# Patient Record
Sex: Male | Born: 1955 | Race: White | Hispanic: No | Marital: Married | Smoking: Never smoker
Health system: Southern US, Community
[De-identification: ages and names within clinical notes are randomized; demographics above are authoritative.]

## PROBLEM LIST (undated history)

## (undated) DIAGNOSIS — N529 Male erectile dysfunction, unspecified: Secondary | ICD-10-CM

## (undated) DIAGNOSIS — R3915 Urgency of urination: Secondary | ICD-10-CM

## (undated) DIAGNOSIS — G4730 Sleep apnea, unspecified: Secondary | ICD-10-CM

## (undated) DIAGNOSIS — I10 Essential (primary) hypertension: Secondary | ICD-10-CM

## (undated) DIAGNOSIS — R195 Other fecal abnormalities: Secondary | ICD-10-CM

## (undated) DIAGNOSIS — N189 Chronic kidney disease, unspecified: Secondary | ICD-10-CM

## (undated) DIAGNOSIS — K5909 Other constipation: Secondary | ICD-10-CM

## (undated) DIAGNOSIS — N400 Enlarged prostate without lower urinary tract symptoms: Secondary | ICD-10-CM

## (undated) DIAGNOSIS — E785 Hyperlipidemia, unspecified: Secondary | ICD-10-CM

## (undated) DIAGNOSIS — R350 Frequency of micturition: Secondary | ICD-10-CM

## (undated) DIAGNOSIS — R3129 Other microscopic hematuria: Secondary | ICD-10-CM

## (undated) DIAGNOSIS — Z8669 Personal history of other diseases of the nervous system and sense organs: Secondary | ICD-10-CM

## (undated) DIAGNOSIS — M109 Gout, unspecified: Secondary | ICD-10-CM

## (undated) DIAGNOSIS — K219 Gastro-esophageal reflux disease without esophagitis: Secondary | ICD-10-CM

## (undated) DIAGNOSIS — R351 Nocturia: Secondary | ICD-10-CM

## (undated) DIAGNOSIS — Z960 Presence of urogenital implants: Secondary | ICD-10-CM

## (undated) DIAGNOSIS — J309 Allergic rhinitis, unspecified: Secondary | ICD-10-CM

## (undated) HISTORY — PX: SHOULDER SURGERY: SHX246

## (undated) HISTORY — PX: UPPER GI ENDOSCOPY: SHX6162

## (undated) HISTORY — PX: OTHER SURGICAL HISTORY: SHX169

## (undated) HISTORY — DX: Essential (primary) hypertension: I10

## (undated) HISTORY — DX: Chronic kidney disease, unspecified: N18.9

## (undated) HISTORY — DX: Sleep apnea, unspecified: G47.30

## (undated) HISTORY — DX: Hyperlipidemia, unspecified: E78.5

## (undated) HISTORY — DX: Gastro-esophageal reflux disease without esophagitis: K21.9

## (undated) HISTORY — DX: Gout, unspecified: M10.9

## (undated) HISTORY — DX: Male erectile dysfunction, unspecified: N52.9

---

## 2003-09-03 ENCOUNTER — Ambulatory Visit (HOSPITAL_BASED_OUTPATIENT_CLINIC_OR_DEPARTMENT_OTHER): Admission: RE | Admit: 2003-09-03 | Discharge: 2003-09-03 | Payer: Self-pay | Admitting: Family Medicine

## 2003-10-16 ENCOUNTER — Ambulatory Visit (HOSPITAL_BASED_OUTPATIENT_CLINIC_OR_DEPARTMENT_OTHER): Admission: RE | Admit: 2003-10-16 | Discharge: 2003-10-16 | Payer: Self-pay | Admitting: Otolaryngology

## 2004-04-30 ENCOUNTER — Encounter (INDEPENDENT_AMBULATORY_CARE_PROVIDER_SITE_OTHER): Payer: Self-pay | Admitting: Specialist

## 2004-04-30 ENCOUNTER — Ambulatory Visit (HOSPITAL_COMMUNITY): Admission: RE | Admit: 2004-04-30 | Discharge: 2004-05-01 | Payer: Self-pay | Admitting: Otolaryngology

## 2004-07-08 ENCOUNTER — Ambulatory Visit (HOSPITAL_BASED_OUTPATIENT_CLINIC_OR_DEPARTMENT_OTHER): Admission: RE | Admit: 2004-07-08 | Discharge: 2004-07-08 | Payer: Self-pay | Admitting: Otolaryngology

## 2007-04-12 HISTORY — PX: TONSILLECTOMY AND ADENOIDECTOMY: SUR1326

## 2009-04-24 ENCOUNTER — Encounter (INDEPENDENT_AMBULATORY_CARE_PROVIDER_SITE_OTHER): Payer: Self-pay | Admitting: *Deleted

## 2009-05-11 ENCOUNTER — Encounter (INDEPENDENT_AMBULATORY_CARE_PROVIDER_SITE_OTHER): Payer: Self-pay

## 2009-05-12 ENCOUNTER — Ambulatory Visit: Payer: Self-pay | Admitting: Gastroenterology

## 2009-05-25 ENCOUNTER — Ambulatory Visit: Payer: Self-pay | Admitting: Gastroenterology

## 2009-11-30 ENCOUNTER — Telehealth (INDEPENDENT_AMBULATORY_CARE_PROVIDER_SITE_OTHER): Payer: Self-pay | Admitting: *Deleted

## 2009-12-07 ENCOUNTER — Telehealth: Payer: Self-pay | Admitting: Gastroenterology

## 2009-12-28 ENCOUNTER — Telehealth (INDEPENDENT_AMBULATORY_CARE_PROVIDER_SITE_OTHER): Payer: Self-pay | Admitting: *Deleted

## 2010-05-11 NOTE — Miscellaneous (Signed)
Summary: Lec previsit  Clinical Lists Changes  Medications: Added new medication of MOVIPREP 100 GM  SOLR (PEG-KCL-NACL-NASULF-NA ASC-C) As per prep instructions. - Signed Rx of MOVIPREP 100 GM  SOLR (PEG-KCL-NACL-NASULF-NA ASC-C) As per prep instructions.;  #1 x 0;  Signed;  Entered by: Ulis Rias RN;  Authorized by: Meryl Dare MD [facility];  Method used: Electronically to [id number] [address] [place], [place], [place]  [address], Ph: [phone] or [phone], Fax: [phone] Observations: Added new observation of NKA: T (05/12/2009 16:30)    Prescriptions: MOVIPREP 100 GM  SOLR (PEG-KCL-NACL-NASULF-NA ASC-C) As per prep instructions.  #1 x 0   Entered by:   Ulis Rias RN   Authorized by:   Meryl Dare MD [facility]   Signed by:   Ulis Rias RN on 05/12/2009   Method used:   Electronically to        [phone]* (retail)       [address] [place], [place]  [address]       Ph: [phone] or [phone]       Fax: [phone]   RxID:   [id number]

## 2010-05-11 NOTE — Letter (Signed)
Summary: Previsit letter  [facility]  [address] [place], [place] [address]   Phone: [phone]  Fax: [phone]       04/24/2009 MRN: [id number]  Matthew [name] [address] [place], [place]  [address]  Dear Matthew [name],  Welcome to the Gastroenterology Division at [facility].    You are scheduled to see a nurse for your pre-procedure visit on 05/12/2009 at 4:30PM on the 3rd floor at [facility], [address]  We ask that you try to arrive at our office 15 minutes prior to your appointment time to allow for check-in.  Your nurse visit will consist of discussing your medical and surgical history, your immediate family medical history, and your medications.    Please bring a complete list of all your medications or, if you prefer, bring the medication bottles and we will list them.  We will need to be aware of both prescribed and over the counter drugs.  We will need to know exact dosage information as well.  If you are on blood thinners (Coumadin, Plavix, Aggrenox, Ticlid, etc.) please call our office today/prior to your appointment, as we need to consult with your physician about holding your medication.   Please be prepared to read and sign documents such as consent forms, a financial agreement, and acknowledgement forms.  If necessary, and with your consent, a friend or relative is welcome to sit-in on the nurse visit with you.  Please bring your insurance card so that we may make a copy of it.  If your insurance requires a referral to see a specialist, please bring your referral form from your primary care physician.  No co-pay is required for this nurse visit.     If you cannot keep your appointment, please call [phone] to cancel or reschedule prior to your appointment date.  This allows [place] the opportunity to schedule an appointment for another patient in need of care.    Thank you for choosing [facility] for your medical needs.   We appreciate the opportunity to care for you.  Please visit [place] at our website  to learn more about our practice.                     Sincerely.                                                                                                                   The Gastroenterology Division

## 2010-05-11 NOTE — Procedures (Signed)
Summary: Colonoscopy  Patient: [name] Note: All result statuses are Final unless otherwise noted.  Tests: (1) Colonoscopy (COL)   COL Colonoscopy           DONE     [facility]     [address]     [place], [place]  [address]           COLONOSCOPY PROCEDURE REPORT           PATIENT:  Matthew [name], Matthew [name]  MR#:  [id number]     BIRTHDATE:  1956-01-30, 54 yrs. old  GENDER:  male           ENDOSCOPIST:  Judie Petit T. Russella Dar, MD, [facility]     Referred by:  Rudi Heap, M.D.           PROCEDURE DATE:  05/25/2009     PROCEDURE:  Colonoscopy 21308     ASA CLASS:  Class II     INDICATIONS:  1) Routine Risk Screening           MEDICATIONS:   Fentanyl 50 mcg IV, Versed 6 mg IV           DESCRIPTION OF PROCEDURE:   After the risks benefits and     alternatives of the procedure were thoroughly explained, informed     consent was obtained.  Digital rectal exam was performed and     revealed no abnormalities.   The LB [id number]-[id number] endoscope     was introduced through the anus and advanced to the cecum, which     was identified by both the appendix and ileocecal valve, without     limitations.  The quality of the prep was excellent, using     MoviPrep.  The instrument was then slowly withdrawn as the colon     was fully examined.     <<PROCEDUREIMAGES>>           FINDINGS:  A normal appearing cecum, ileocecal valve, and     appendiceal orifice were identified. The ascending, hepatic     flexure, transverse, splenic flexure, descending, sigmoid colon,     and rectum appeared unremarkable. Retroflexed views in the rectum     revealed no abnormalities. The time to cecum =  2  minutes. The     scope was then withdrawn (time =  9  min) from the patient and the     procedure completed.           COMPLICATIONS:  None           ENDOSCOPIC IMPRESSION:     1) Normal colon           RECOMMENDATIONS:     1) Continue current colorectal screening recommendations for     "routine risk"  patients with a repeat colonoscopy in 10 years.           Venita Lick. Russella Dar, MD, [facility]           n.     eSIGNED:   Venita Lick. Rosmary Dionisio at 05/25/2009 12:07 PM           Johnathan Hausen, [phone]  Note: An exclamation mark (!) indicates a result that was not dispersed into the flowsheet. Document Creation Date: 05/25/2009 12:07 PM _______________________________________________________________________  (1) Order result status: Final Collection or observation date-time: 05/25/2009 12:03 Requested date-time:  Receipt date-time:  Reported date-time:  Referring Physician:   Ordering Physician: Claudette Head [phone]) Specimen Source:  Source: Launa Grill Order  Number: [id number] Lab site:   Appended Document: Colonoscopy    Clinical Lists Changes  Observations: Added new observation of COLONNXTDUE: 05/2019 (05/25/2009 13:07)

## 2010-05-11 NOTE — Letter (Signed)
Summary: [facility] Instructions  [facility]  [address] [place], [place] [address]   Phone: [phone]  Fax: [phone]       CRESPIN FORSTROM    [birth year]    MRN: [id number]        Procedure Day /Date:  Monday 05/25/2009     Arrival Time: 10:30 am      Procedure Time: 11:30 am     Location of Procedure:                    _ x_   Endoscopy Center (4th Floor)   PREPARATION FOR COLONOSCOPY WITH MOVIPREP   Starting 5 days prior to your procedure Wednesday 2/9 do not eat nuts, seeds, popcorn, corn, beans, peas,  salads, or any raw vegetables.  Do not take any fiber supplements (e.g. Metamucil, Citrucel, and Benefiber).  THE DAY BEFORE YOUR PROCEDURE         DATE: Sunday 2/13  1.  Drink clear liquids the entire day-NO SOLID FOOD  2.  Do not drink anything colored red or purple.  Avoid juices with pulp.  No orange juice.  3.  Drink at least 64 oz. (8 glasses) of fluid/clear liquids during the day to prevent dehydration and help the prep work efficiently.  CLEAR LIQUIDS INCLUDE: Water Jello Ice Popsicles Tea (sugar ok, no milk/cream) Powdered fruit flavored drinks Coffee (sugar ok, no milk/cream) Gatorade Juice: apple, white grape, white cranberry  Lemonade Clear bullion, consomm, broth Carbonated beverages (any kind) Strained chicken noodle soup Hard Candy                             4.  In the morning, mix first dose of MoviPrep solution:    Empty 1 Pouch A and 1 Pouch B into the disposable container    Add lukewarm drinking water to the top line of the container. Mix to dissolve    Refrigerate (mixed solution should be used within 24 hrs)  5.  Begin drinking the prep at 5:00 p.m. The MoviPrep container is divided by 4 marks.   Every 15 minutes drink the solution down to the next mark (approximately 8 oz) until the full liter is complete.   6.  Follow completed prep with 16 oz of clear liquid of your choice (Nothing red or purple).   Continue to drink clear liquids until bedtime.  7.  Before going to bed, mix second dose of MoviPrep solution:    Empty 1 Pouch A and 1 Pouch B into the disposable container    Add lukewarm drinking water to the top line of the container. Mix to dissolve    Refrigerate  THE DAY OF YOUR PROCEDURE      DATE: Monday 2/14  Beginning at 6:30 a.m. (5 hours before procedure):         1. Every 15 minutes, drink the solution down to the next mark (approx 8 oz) until the full liter is complete.  2. Follow completed prep with 16 oz. of clear liquid of your choice.    3. You may drink clear liquids until 9:30 am (2 HOURS BEFORE PROCEDURE).   MEDICATION INSTRUCTIONS  Unless otherwise instructed, you should take regular prescription medications with a small sip of water   as early as possible the morning of your procedure.         OTHER INSTRUCTIONS  You will need a responsible adult at least  53 years of age to accompany you and drive you home.   This person must remain in the waiting room during your procedure.  Wear loose fitting clothing that is easily removed.  Leave jewelry and other valuables at home.  However, you may wish to bring a book to read or  an iPod/MP3 player to listen to music as you wait for your procedure to start.  Remove all body piercing jewelry and leave at home.  Total time from sign-in until discharge is approximately 2-3 hours.  You should go home directly after your procedure and rest.  You can resume normal activities the  day after your procedure.  The day of your procedure you should not:   Drive   Make legal decisions   Operate machinery   Drink alcohol   Return to work  You will receive specific instructions about eating, activities and medications before you leave.    The above instructions have been reviewed and explained to me by   Ulis Rias RN  May 12, 2009 4:58 PM     I fully understand and can verbalize these  instructions _____________________________ Date _________

## 2010-05-11 NOTE — Progress Notes (Signed)
  Phone Note Other Incoming   Request: Send information Summary of Call: Request for records received from [employer]. Request forwarded to [employer].

## 2010-05-11 NOTE — Progress Notes (Signed)
Summary: paperwork  Phone Note Call from Patient Call back at Home Phone [phone]   Caller: Patient Call For: Dr. Russella Dar Reason for Call: Talk to Nurse Summary of Call: following up on insurance claim paperwork that was dropped off 17th or 18th of this month Initial call taken by: Vallarie Mare,  December 07, 2009 4:59 PM  Follow-up for Phone Call        spoke with Renae in Medical Records... she confirmed that papers have been received and she will be calling the pt today Follow-up by: Vallarie Mare,  December 08, 2009 8:41 AM

## 2010-05-11 NOTE — Progress Notes (Signed)
  Phone Note Other Incoming   Request: Send information Summary of Call: Request for [employer] to complete documentation. Request forwarded to [employer].

## 2010-08-27 NOTE — Procedures (Signed)
NAME:  Matthew [name], Matthew [name]             ACCOUNT NO.:  [id number]   MEDICAL RECORD NO.:  [id number]          PATIENT TYPE:  OUT   LOCATION:  SLEEP CENTER                 FACILITY:  [facility]   PHYSICIAN:  Clinton D. Maple Hudson, M.D. DATE OF BIRTH:  02-Feb-1956   DATE OF ADMISSION:  10/16/2003  DATE OF DISCHARGE:  10/16/2003                              NOCTURNAL POLYSOMNOGRAM   REFERRING PHYSICIAN:  Dr. Osborn Coho   INDICATION FOR STUDY/HISTORY:  CPAP titration requested for problem of  hypersomnia with obstructive sleep apnea.  Baseline diagnostic NPSG on Sep 09, 2003 recorded a RDI of 27.3 obstructive events per hour associated with  mild to moderate snoring, oxygen desaturation at 85% and normal cardiac  rhythm.  Home medications include Diovan, Indocin and Flonase with no  bedtime medications taken for this study.  Neck size is 16.5 inches, BMI  35.7, weight 250 pounds.  His Epworth score, on Sep 09, 2003, was 14/24.   SLEEP ARCHITECTURE:  A total of 333 minutes of sleep were recorded with a  sleep efficiency of 73%.  Latency to sleep onset was 53 minutes, latency to  REM was 100 minutes.   RESPIRATORY DATA:  CPAP was titrated to 12 CWP, RDI of 0 per hour.  He was  fitted with a large Respironics comfort full face mask and a heated  humidifier.  CPAP was well-tolerated and effective.   OXYGEN DATA:  No supplemental oxygen was required.  Mean oxygen saturation  during sleep was 95% with a nadir of 90% recorded during stage 1 before full  CPAP control.   CARDIAC DATA:  Normal sinus rhythm without significant ectopics.   MOVEMENTS/PARASOMNIAS:  Nineteen limb jerks were recorded of which seven  were associated with arousals for a periodic limb movement with arousal  index of 1.3 per hour, which is not likely to be clinically significant.   IMPRESSION:  Moderately severe obstructive sleep apnea/hypopnea based on  diagnostic study Sep 09, 2003.  Good CPAP control on 12 CWP, RDI of 0 per  hour.                                   ______________________________                                Rennis Chris. Maple Hudson, M.D.                                Diplomate, Biomedical engineer of Sleep Medicine    CDY/MEDQ  D:  10/26/2003 13:28:34  T:  10/26/2003 21:52:47  Job:  [id number]

## 2010-08-27 NOTE — Procedures (Signed)
NAME:  Matthew [name], Matthew [name]               ACCOUNT NO.:  [id number]   MEDICAL RECORD NO.:  [id number]          PATIENT TYPE:  OUT   LOCATION:  SLEEP CENTER                 FACILITY:  [facility]   PHYSICIAN:  Clinton D. Maple Hudson, M.D. DATE OF BIRTH:  [birth year]   DATE OF STUDY:  07/08/2004                              NOCTURNAL POLYSOMNOGRAM   REFERRING PHYSICIAN:  Dr. Onalee Hua L. Shoemaker.   INDICATION FOR SURGERY:  Hypersomnia with sleep apnea.   EPWORTH SLEEPINESS SCORE:  2/24.   BODY MASS INDEX:  33.   WEIGHT:  235 pounds.   INDICATION:  The patient had an initial diagnostic study on Sep 09, 2003  reporting an RDI of 27.3 and a subsequent CPAP titration on October 16, 2003  with a CPAP of 12.  At that time, he weighed 254 pounds.  He has  subsequently lost weight had surgery.  This is a followup study.   SLEEP ARCHITECTURE:  Total sleep time 386 minutes with sleep efficiency 84%.  Stage I was 5%, stage II 51%, stages III and IV 19%, and REM was 26% of  total sleep time.  Sleep latency 25 minutes, REM latency 97 minutes, awake  after sleep onset 48 minutes, arousal index 10.  No sleep medication was  reported.   RESPIRATORY DATA:  Respiratory disturbance index (RDI) 13.2 obstructive  events per hour, indicating mild obstructive sleep apnea/hypopnea syndrome.  Events were not positional.  REM RDI 15.  He did not qualify for split-  protocol titration because of insufficient early events.   OXYGEN DATA:  Very light snoring heard only occasionally by the technician.  Brief oxygen desaturation to a  nadir of 86%.  Mean oxygen saturation  through the study was 93% on room air.   CARDIAC DATA:  Normal sinus rhythm.   MOVEMENT/PARASOMNIA:  Insignificant.   IMPRESSION/RECOMMENDATION:  1.  Mild obstructive sleep apnea/hypopnea syndrome, respiratory disturbance      index 13.2 per hour (18 central apneas, 17 obstructive apneas, 1 mixed      apnea, 49 hypopneas).  Insignificant snoring and minimal  oxygen      desaturation.  2.  Weight loss and surgery have occurred since previous diagnostic study of      Sep 09, 2003 reported respiratory disturbance index of 27.3.      CDY/MEDQ  D:  07/11/2004 15:14:22  T:  07/12/2004 07:02:02  Job:  [id number]

## 2010-08-27 NOTE — Op Note (Signed)
NAMERESEAN, BRANDER               ACCOUNT NO.:  [id number]   MEDICAL RECORD NO.:  [id number]          PATIENT TYPE:  OIB   LOCATION:  NA                           FACILITY:  [facility]   PHYSICIAN:  Kinnie Scales. Annalee Genta, M.D.DATE OF BIRTH:  [birth year]   DATE OF PROCEDURE:  04/30/2004  DATE OF DISCHARGE:                                 OPERATIVE REPORT   PREOPERATIVE DIAGNOSES:  1.  Moderately severe obstructive sleep apnea.  2.  Nasal airway obstruction.  3.  Nasal septal deviation.  4.  Uvulopalatal and tonsillar hypertrophy.   POSTOPERATIVE DIAGNOSES:  1.  Moderately severe obstructive sleep apnea.  2.  Nasal airway obstruction.  3.  Nasal septal deviation.  4.  Uvulopalatal and tonsillar hypertrophy.   INDICATIONS FOR SURGERY:  1.  Moderately severe obstructive sleep apnea.  2.  Nasal airway obstruction.  3.  Nasal septal deviation.  4.  Uvulopalatal and tonsillar hypertrophy.   SURGICAL PROCEDURES:  1.  Uvulopalatopharyngoplasty.  2.  Nasal septoplasty.  3.  Tonsillectomy.  4.  Bilateral inferior turbinate reduction.   ANESTHESIA:  General endotracheal.   SURGEON:  Kinnie Scales. Annalee Genta, M.D.   COMPLICATIONS:  None.   ESTIMATED BLOOD LOSS:  Less than 50 cc.   Patient transferred from the operating room to the recovery room in stable  condition.   BRIEF HISTORY:  Mr. Gieselman is a 55 year old white male who was referred for  evaluation of obstructive sleep apnea.  The patient had undergone prior  sleep study which showed moderately severe obstruction.  He was titrated on  CPAP which he attempted to wear at home for over a year.  Unfortunately, the  patient was unable to wear CPAP on a consistent basis because of complaints  of nasal airway pressure, obstruction, and headache, and based on his  failure to use CPAP I recommended surgical intervention.  The patient's  anatomy showed a deviated septum, turbinate hypertrophy, uvulopalatal and  tonsillar hypertrophy, and  given this history and examination I recommended  nasal septoplasty, turbinate reduction, uvulopalatopharyngoplasty, and  tonsillectomy.  The risks, benefits, and possible complications of each of  these surgical procedures were discussed in detail with Mr. Hoel and his  wife, and they understood and concurred with our plan for surgery, which was  scheduled as above.   PROCEDURE:  The patient was brought to the operating room on April 30, 2004 and placed in a supine position on the operating table.  General  endotracheal anesthesia was established without difficulty, and the patient  was adequately anesthetized.  His nasal cavity was injected with 12 cc of 1%  lidocaine 1:100,000 solution of epinephrine injected in a submucosal fashion  along the nasal septum and inferior turbinates bilaterally.  The patient's  nose was then packed with Afrin-soaked cottonoid pledgets which were left in  place for approximately 10 minutes to allow for vasoconstriction and  hemostasis.  The surgical procedure was begun with the nasoseptoplasty.  The  patient's cottonoid pledgets were removed, and he was prepped and draped in  a sterile fashion.  A right anterior hemitransfixion incision  was created,  and a mucoperichondrial flap was elevated from anterior to posterior along  the patient's right hand side.  Deviated bone and cartilage were resected,  and midseptal cartilage was morselized and returned to the mucoperichondrial  pocket at the conclusion of the surgical procedure.  A mucoperiosteal flap  was elevated along the patient's right hand side, and bilateral mucosal  flaps were well elevated.  Deviated bone and cartilage, particularly in the  posterior aspect of the septum, and a large inferior septal spur along the  right hand side were resected with a 4 mm osteotome.  The mucoperichondrial  flaps were reapproximated with a 4-0 gut suture on a Keith needle in a  horizontal mattressing fashion,  and the anterior hemitransfixion incision  was closed with the same stitch.  At the conclusion of the surgical  procedure, bilateral Doyle nasal septal splints were placed after the  application of Bactroban ointment, and these were sutured in position with a  3-0 Ethilon suture.   Inferior turbinate cautery/reduction was then performed.  Bipolar intramural  cautery set at 12 watts was used, with several passes in a submucosal  fashion to each inferior turbinate.  The turbinates were then outfractured,  and the inferior component of the turbinate was resected and cauterized.  The patient's nasal cavity was then irrigated and suctioned.   A Crowe-Davis mouth gag was inserted without difficulty.  There were no  loose or broken teeth, and the hard and soft palate were intact.  The  patient was found to have tonsillar and uvulopalatal hypertrophy.  Beginning  on the patient's left hand side, tonsillectomy was performed using Harmonic  scalpel and dissecting in a subcapsular fashion.  The entire left tonsil was  resected from superior pole to tongue base.  Right tonsil was removed in a  similar fashion.  Tonsillar fossa were gently abraded with a dry tonsil  sponge.  Several areas of point hemorrhage were cauterized with suction  cautery.   With completion of the tonsillectomy, a uvulopalatopharyngoplasty was  performed.  The anterior to posterior dimension of the palate was estimated.  An approximately 1.5 cm strip of mucosa and muscle was resected beginning  along the anterior aspect of the tonsillar pillars, dissecting in a superior  to inferior direction and preserving posterior mucosa for closure.  A  uvulopalatopharyngoplasty was performed.  Reconstruction was then undertaken  using a 4-0 Vicryl suture on a tapered needle to advance the posterior  tonsillar pillars and reapproximate tonsillar fossa.  Posterior mucosa was then advanced and closed along the margin of the palate.  The  patient's  nasal cavity and nasopharynx were then irrigated and suctioned.  An  orogastric tube was passed, and the stomach contents were aspirated.  There  was no active bleeding.  Crowe-Davis mouth gag was released and reapplied.  Again, no  bleeding was noted.  The Crowe-Davis mouth gag was removed.  There were no  loose or broken teeth.  The patient was then awakened from his anesthetic.  He was extubated and was transferred from the operating room to the recovery  room in stable condition.  There were no complications.  Blood loss was less  than 50 cc.       DLS/MEDQ  D:  34/74/2595  T:  04/30/2004  Job:  [id number]

## 2012-06-26 ENCOUNTER — Other Ambulatory Visit: Payer: Self-pay | Admitting: Nurse Practitioner

## 2012-06-26 DIAGNOSIS — E039 Hypothyroidism, unspecified: Secondary | ICD-10-CM

## 2012-06-26 DIAGNOSIS — E785 Hyperlipidemia, unspecified: Secondary | ICD-10-CM

## 2012-06-26 DIAGNOSIS — E559 Vitamin D deficiency, unspecified: Secondary | ICD-10-CM

## 2012-06-26 DIAGNOSIS — I10 Essential (primary) hypertension: Secondary | ICD-10-CM

## 2012-06-26 DIAGNOSIS — E291 Testicular hypofunction: Secondary | ICD-10-CM

## 2012-06-29 ENCOUNTER — Other Ambulatory Visit: Payer: Self-pay

## 2012-06-29 NOTE — Progress Notes (Signed)
Patient came in for labs only.

## 2012-07-02 NOTE — Progress Notes (Signed)
Patient coming back for labs

## 2012-07-07 ENCOUNTER — Other Ambulatory Visit: Payer: Self-pay | Admitting: Nurse Practitioner

## 2012-08-17 ENCOUNTER — Other Ambulatory Visit: Payer: Self-pay

## 2012-08-17 MED ORDER — TESTOSTERONE 20.25 MG/1.25GM (1.62%) TD GEL: 2.0000 | Freq: Every day | TRANSDERMAL | Status: DC

## 2012-08-17 NOTE — Telephone Encounter (Signed)
Last seen 09/21/11   If approved print RX and have nurse call patient to pick up

## 2012-10-08 ENCOUNTER — Other Ambulatory Visit: Payer: Self-pay | Admitting: Nurse Practitioner

## 2012-10-08 MED ORDER — TESTOSTERONE 20.25 MG/1.25GM (1.62%) TD GEL: 2.0000 | Freq: Every day | TRANSDERMAL | Status: DC

## 2012-10-08 MED ORDER — OMEPRAZOLE 40 MG PO CPDR: 40.0000 mg | DELAYED_RELEASE_CAPSULE | Freq: Every day | ORAL | Status: DC

## 2012-10-08 NOTE — Telephone Encounter (Signed)
Last seen 09/21/11   MMM

## 2012-11-05 ENCOUNTER — Other Ambulatory Visit: Payer: Self-pay | Admitting: Nurse Practitioner

## 2012-11-06 ENCOUNTER — Other Ambulatory Visit: Payer: Self-pay | Admitting: *Deleted

## 2012-11-06 MED ORDER — TADALAFIL 20 MG PO TABS: ORAL_TABLET | ORAL | Status: DC

## 2012-11-06 NOTE — Telephone Encounter (Signed)
LAST OV 6/13

## 2012-11-07 NOTE — Telephone Encounter (Signed)
Not seen since 06/13

## 2012-11-09 ENCOUNTER — Telehealth: Payer: Self-pay | Admitting: Nurse Practitioner

## 2012-11-10 ENCOUNTER — Other Ambulatory Visit: Payer: Self-pay | Admitting: Nurse Practitioner

## 2012-11-12 NOTE — Telephone Encounter (Signed)
Patients last office visit 09-21-11. Please advise. Was to follow up in 3 months.

## 2012-11-13 ENCOUNTER — Other Ambulatory Visit: Payer: Self-pay | Admitting: Nurse Practitioner

## 2012-11-13 DIAGNOSIS — Z125 Encounter for screening for malignant neoplasm of prostate: Secondary | ICD-10-CM

## 2012-11-13 DIAGNOSIS — I10 Essential (primary) hypertension: Secondary | ICD-10-CM

## 2012-11-13 DIAGNOSIS — E291 Testicular hypofunction: Secondary | ICD-10-CM

## 2012-11-13 DIAGNOSIS — E785 Hyperlipidemia, unspecified: Secondary | ICD-10-CM

## 2012-11-14 ENCOUNTER — Other Ambulatory Visit (INDEPENDENT_AMBULATORY_CARE_PROVIDER_SITE_OTHER): Payer: BC Managed Care – PPO

## 2012-11-14 DIAGNOSIS — E291 Testicular hypofunction: Secondary | ICD-10-CM

## 2012-11-14 DIAGNOSIS — E785 Hyperlipidemia, unspecified: Secondary | ICD-10-CM

## 2012-11-14 DIAGNOSIS — I10 Essential (primary) hypertension: Secondary | ICD-10-CM

## 2012-11-14 DIAGNOSIS — Z125 Encounter for screening for malignant neoplasm of prostate: Secondary | ICD-10-CM

## 2012-11-14 NOTE — Telephone Encounter (Signed)
Rx done on 11/13/2012.

## 2012-11-14 NOTE — Progress Notes (Signed)
Patient came in for labs only.

## 2012-11-19 LAB — CMP14+EGFR
ALT: 15 IU/L (ref 0–44)
AST: 19 IU/L (ref 0–40)
Albumin/Globulin Ratio: 2.4 (ref 1.1–2.5)
Albumin: 4.8 g/dL (ref 3.5–5.5)
Alkaline Phosphatase: 56 IU/L (ref 39–117)
BUN/Creatinine Ratio: 15 (ref 9–20)
BUN: 22 mg/dL (ref 6–24)
CO2: 26 mmol/L (ref 18–29)
Calcium: 9.6 mg/dL (ref 8.7–10.2)
Chloride: 101 mmol/L (ref 97–108)
Creatinine, Ser: 1.43 mg/dL — ABNORMAL HIGH (ref 0.76–1.27)
GFR calc Af Amer: 63 mL/min/{1.73_m2} (ref >59)
GFR calc non Af Amer: 55 mL/min/{1.73_m2} — ABNORMAL LOW (ref >59)
Globulin, Total: 2 g/dL (ref 1.5–4.5)
Glucose: 95 mg/dL (ref 65–99)
Potassium: 4.9 mmol/L (ref 3.5–5.2)
Sodium: 139 mmol/L (ref 134–144)
Total Bilirubin: 0.5 mg/dL (ref 0.0–1.2)
Total Protein: 6.8 g/dL (ref 6.0–8.5)

## 2012-11-19 LAB — NMR, LIPOPROFILE
Cholesterol: 135 mg/dL (ref <200)
HDL Cholesterol by NMR: 39 mg/dL — ABNORMAL LOW (ref >=40)
HDL Particle Number: 26.9 umol/L — ABNORMAL LOW (ref >=30.5)
LDL Particle Number: 1144 nmol/L — ABNORMAL HIGH (ref <1000)
LDL Size: 20.7 nm (ref >20.5)
LDLC SERPL CALC-MCNC: 71 mg/dL (ref <100)
LP-IR Score: 58 — ABNORMAL HIGH (ref <=45)
Small LDL Particle Number: 558 nmol/L — ABNORMAL HIGH (ref <=527)
Triglycerides by NMR: 125 mg/dL (ref <150)

## 2012-11-19 LAB — PSA, TOTAL AND FREE
PSA, Free Pct: 24.3 %
PSA, Free: 0.17 ng/mL
PSA: 0.7 ng/mL (ref 0.0–4.0)

## 2012-11-19 LAB — TESTOSTERONE,FREE AND TOTAL
Testosterone, Free: 9.1 pg/mL (ref 7.2–24.0)
Testosterone: 524 ng/dL (ref 348–1197)

## 2012-11-30 ENCOUNTER — Other Ambulatory Visit: Payer: Self-pay | Admitting: Nurse Practitioner

## 2012-11-30 MED ORDER — INDOMETHACIN 50 MG PO CAPS: 50.0000 mg | ORAL_CAPSULE | Freq: Two times a day (BID) | ORAL | Status: DC

## 2012-12-30 ENCOUNTER — Other Ambulatory Visit: Payer: Self-pay | Admitting: Nurse Practitioner

## 2013-01-02 ENCOUNTER — Other Ambulatory Visit: Payer: Self-pay | Admitting: *Deleted

## 2013-01-02 MED ORDER — TESTOSTERONE 20.25 MG/1.25GM (1.62%) TD GEL: 2.0000 | Freq: Every day | TRANSDERMAL | Status: DC

## 2013-01-02 NOTE — Telephone Encounter (Signed)
LAST RF 10/10/12. LAST TEST LEVEL 11/14/12. LAST OV 09/21/11.

## 2013-01-02 NOTE — Telephone Encounter (Signed)
rx ready for pickup 

## 2013-01-03 NOTE — Telephone Encounter (Signed)
Number not working up front to be picked up

## 2013-02-12 ENCOUNTER — Other Ambulatory Visit: Payer: Self-pay

## 2013-02-12 NOTE — Telephone Encounter (Signed)
Last seen 09/21/11

## 2013-02-14 ENCOUNTER — Other Ambulatory Visit: Payer: Self-pay

## 2013-02-18 ENCOUNTER — Other Ambulatory Visit: Payer: BC Managed Care – PPO

## 2013-02-19 ENCOUNTER — Other Ambulatory Visit: Payer: Self-pay | Admitting: Nurse Practitioner

## 2013-02-19 ENCOUNTER — Other Ambulatory Visit (INDEPENDENT_AMBULATORY_CARE_PROVIDER_SITE_OTHER): Payer: BC Managed Care – PPO

## 2013-02-19 DIAGNOSIS — E785 Hyperlipidemia, unspecified: Secondary | ICD-10-CM

## 2013-02-19 DIAGNOSIS — E291 Testicular hypofunction: Secondary | ICD-10-CM

## 2013-02-19 DIAGNOSIS — E039 Hypothyroidism, unspecified: Secondary | ICD-10-CM

## 2013-02-19 DIAGNOSIS — I10 Essential (primary) hypertension: Secondary | ICD-10-CM

## 2013-02-19 DIAGNOSIS — E559 Vitamin D deficiency, unspecified: Secondary | ICD-10-CM

## 2013-02-19 LAB — COMPREHENSIVE METABOLIC PANEL
ALT: 16 U/L (ref 0–53)
AST: 16 U/L (ref 0–37)
Albumin: 4.1 g/dL (ref 3.5–5.2)
Alkaline Phosphatase: 55 U/L (ref 39–117)
BUN: 14 mg/dL (ref 6–23)
CO2: 29 mEq/L (ref 19–32)
Calcium: 9.5 mg/dL (ref 8.4–10.5)
Chloride: 103 mEq/L (ref 96–112)
Creat: 1.08 mg/dL (ref 0.50–1.35)
Glucose, Bld: 95 mg/dL (ref 70–99)
Potassium: 5.3 mEq/L (ref 3.5–5.3)
Sodium: 139 mEq/L (ref 135–145)
Total Bilirubin: 0.5 mg/dL (ref 0.3–1.2)
Total Protein: 6.5 g/dL (ref 6.0–8.3)

## 2013-02-19 LAB — THYROID PANEL WITH TSH
Free Thyroxine Index: 3.3 (ref 1.0–3.9)
T3 Uptake: 35.1 % (ref 22.5–37.0)
T4, Total: 9.5 ug/dL (ref 5.0–12.5)
TSH: 1.189 u[IU]/mL (ref 0.350–4.500)

## 2013-02-19 MED ORDER — TESTOSTERONE 20.25 MG/1.25GM (1.62%) TD GEL: 2.0000 | Freq: Every day | TRANSDERMAL | Status: DC

## 2013-02-19 NOTE — Progress Notes (Signed)
Patient came in for labs only.

## 2013-02-20 LAB — NMR LIPOPROFILE WITH LIPIDS
Cholesterol, Total: 151 mg/dL (ref <200)
HDL Particle Number: 33.8 umol/L (ref >=30.5)
HDL Size: 8.3 nm — ABNORMAL LOW (ref >=9.2)
HDL-C: 43 mg/dL (ref >=40)
LDL (calc): 69 mg/dL (ref <100)
LDL Particle Number: 1372 nmol/L — ABNORMAL HIGH (ref <1000)
LDL Size: 20.5 nm — ABNORMAL LOW (ref >20.5)
LP-IR Score: 93 — ABNORMAL HIGH (ref <=45)
Large HDL-P: <1.3 umol/L — ABNORMAL LOW (ref >=4.8)
Large VLDL-P: 15.4 nmol/L — ABNORMAL HIGH (ref <=2.7)
Small LDL Particle Number: 718 nmol/L — ABNORMAL HIGH (ref <=527)
Triglycerides: 195 mg/dL — ABNORMAL HIGH (ref <150)
VLDL Size: 61.1 nm — ABNORMAL HIGH (ref <=46.6)

## 2013-02-20 LAB — VITAMIN D 25 HYDROXY (VIT D DEFICIENCY, FRACTURES): Vit D, 25-Hydroxy: 50 ng/mL (ref 30–89)

## 2013-02-20 LAB — PSA: PSA: 0.51 ng/mL (ref <=4.00)

## 2013-02-21 LAB — TESTOSTERONE, TOTAL AND FREE DIRECT MEASURE
Free Testosterone, Direct: 2.4 pg/mL — ABNORMAL LOW (ref 3.8–34.2)
Testosterone: 193 ng/dL — ABNORMAL LOW (ref 300–890)

## 2013-03-12 ENCOUNTER — Other Ambulatory Visit: Payer: Self-pay | Admitting: Nurse Practitioner

## 2013-03-12 MED ORDER — TADALAFIL 5 MG PO TABS: 5.0000 mg | ORAL_TABLET | Freq: Every day | ORAL | Status: DC | PRN

## 2013-03-15 ENCOUNTER — Encounter: Payer: Self-pay | Admitting: Nurse Practitioner

## 2013-03-15 ENCOUNTER — Ambulatory Visit (INDEPENDENT_AMBULATORY_CARE_PROVIDER_SITE_OTHER): Payer: BC Managed Care – PPO | Admitting: Nurse Practitioner

## 2013-03-15 VITALS — BP 140/97 | HR 98 | Temp 98.0°F | Ht 70.0 in | Wt 255.0 lb

## 2013-03-15 DIAGNOSIS — E785 Hyperlipidemia, unspecified: Secondary | ICD-10-CM | POA: Insufficient documentation

## 2013-03-15 DIAGNOSIS — Z1212 Encounter for screening for malignant neoplasm of rectum: Secondary | ICD-10-CM

## 2013-03-15 DIAGNOSIS — I10 Essential (primary) hypertension: Secondary | ICD-10-CM

## 2013-03-15 DIAGNOSIS — M109 Gout, unspecified: Secondary | ICD-10-CM

## 2013-03-15 DIAGNOSIS — N529 Male erectile dysfunction, unspecified: Secondary | ICD-10-CM

## 2013-03-15 MED ORDER — TESTOSTERONE 20.25 MG/1.25GM (1.62%) TD GEL: 2.0000 | Freq: Every day | TRANSDERMAL | Status: DC

## 2013-03-15 MED ORDER — BENAZEPRIL HCL 40 MG PO TABS: 40.0000 mg | ORAL_TABLET | Freq: Every day | ORAL | Status: DC

## 2013-03-15 MED ORDER — OMEPRAZOLE 40 MG PO CPDR: 40.0000 mg | DELAYED_RELEASE_CAPSULE | Freq: Every day | ORAL | Status: DC

## 2013-03-15 MED ORDER — ROSUVASTATIN CALCIUM 10 MG PO TABS: 10.0000 mg | ORAL_TABLET | Freq: Every day | ORAL | Status: DC

## 2013-03-15 MED ORDER — INDOMETHACIN 50 MG PO CAPS: 50.0000 mg | ORAL_CAPSULE | Freq: Two times a day (BID) | ORAL | Status: DC

## 2013-03-15 NOTE — Progress Notes (Signed)
Subjective:    Patient ID: Matthew [name], male    DOB: 10/19/1955, 57 y.o.   MRN: [id number]  HPI  Patient here today for follow up of chronic medical problems- He is doing well without complaints Patient Active Problem List   Diagnosis Date Noted  . Gout 03/15/2013  . Hypertension 03/15/2013  . Hyperlipidemia LDL goal < 100 03/15/2013  . Erectile dysfunction 03/15/2013   Outpatient Encounter Prescriptions as of 03/15/2013  Medication Sig  . benazepril (LOTENSIN) 40 MG tablet TAKE 1/2 TO 1 TABLET BY MOUTH EVERY DAY  . CRESTOR 5 MG tablet TAKE ONE TABLET BY MOUTH ONE TIME DAILY  . indomethacin (INDOCIN) 50 MG capsule Take 1 capsule (50 mg total) by mouth 2 (two) times daily with a meal.  . omeprazole (PRILOSEC) 40 MG capsule TAKE 1 CAPSULE (40 MG TOTAL)  BY MOUTH DAILY.  . tadalafil (CIALIS) 20 MG tablet TAKE ONE TABLET BY MOUTH AS NEEDED  . tadalafil (CIALIS) 5 MG tablet Take 1 tablet (5 mg total) by mouth daily as needed for erectile dysfunction.  . Testosterone (ANDROGEL) 20.25 MG/1.25GM (1.62%) GEL Place 2 Squirts onto the skin daily.       Review of Systems  Constitutional: Negative.   HENT: Negative.   Eyes: Negative.   Respiratory: Negative.   Cardiovascular: Negative.   Gastrointestinal: Negative.   Musculoskeletal: Negative.   Psychiatric/Behavioral: Negative.   All other systems reviewed and are negative.       Objective:   Physical Exam  Constitutional: He is oriented to person, place, and time. He appears well-developed and well-nourished.  HENT:  Head: Normocephalic.  Right Ear: External ear normal.  Left Ear: External ear normal.  Nose: Nose normal.  Mouth/Throat: Oropharynx is clear and moist.  Eyes: EOM are normal. Pupils are equal, round, and reactive to light.  Neck: Normal range of motion. Neck supple. No JVD present. No thyromegaly present.  Cardiovascular: Normal rate, regular rhythm, normal heart sounds and intact distal pulses.  Exam reveals  no gallop and no friction rub.   No murmur heard. Pulmonary/Chest: Effort normal and breath sounds normal. No respiratory distress. He has no wheezes. He has no rales. He exhibits no tenderness.  Abdominal: Soft. Bowel sounds are normal. He exhibits no mass. There is no tenderness.  Genitourinary: Prostate normal and penis normal.  Musculoskeletal: Normal range of motion. He exhibits no edema.  Lymphadenopathy:    He has no cervical adenopathy.  Neurological: He is alert and oriented to person, place, and time. No cranial nerve deficit.  Skin: Skin is warm and dry.  2cm nontender no mobile lesion left upper thigh  Psychiatric: He has a normal mood and affect. His behavior is normal. Judgment and thought content normal.      BP 140/97  Pulse 98  Temp(Src) 98 F (36.7 C) (Oral)  Ht 5\' 10"  (1.778 m)  Wt 255 lb (115.667 kg)  BMI 36.59 kg/m2     Assessment & Plan:   1. Screening for malignant neoplasm of the rectum   2. Gout   3. Hypertension   4. Hyperlipidemia LDL goal < 100   5. Erectile dysfunction    Orders Placed This Encounter  Procedures  . Fecal occult blood, imunochemical   Meds ordered this encounter  Medications  . benazepril (LOTENSIN) 40 MG tablet    Sig: Take 1 tablet (40 mg total) by mouth daily.    Dispense:  90 tablet    Refill:  1  NTBS for future refills    Order Specific Question:  Supervising Provider    Answer:  Ernestina Penna [[id number]]  . rosuvastatin (CRESTOR) 10 MG tablet    Sig: Take 1 tablet (10 mg total) by mouth daily.    Dispense:  90 tablet    Refill:  3    Order Specific Question:  Supervising Provider    Answer:  Ernestina Penna [[id number]]  . indomethacin (INDOCIN) 50 MG capsule    Sig: Take 1 capsule (50 mg total) by mouth 2 (two) times daily with a meal.    Dispense:  90 capsule    Refill:  1    Order Specific Question:  Supervising Provider    Answer:  Ernestina Penna [[id number]]  . omeprazole (PRILOSEC) 40 MG capsule    Sig: Take 1  capsule (40 mg total) by mouth daily.    Dispense:  90 capsule    Refill:  1    Order Specific Question:  Supervising Provider    Answer:  Ernestina Penna [[id number]]  . Testosterone (ANDROGEL) 20.25 MG/1.25GM (1.62%) GEL    Sig: Place 2 Squirts onto the skin daily.    Dispense:  3.75 g    Refill:  1    Order Specific Question:  Supervising Provider    Answer:  Deborra Medina    Continue all meds Labs pending Diet and exercise encouraged Health maintenance reviewed Follow up in 3 month  Mary-Margaret Daphine Deutscher, FNP

## 2013-03-15 NOTE — Patient Instructions (Signed)
Fat and Cholesterol Control Diet  Fat and cholesterol levels in your blood and organs are influenced by your diet. High levels of fat and cholesterol may lead to diseases of the heart, small and large blood vessels, gallbladder, liver, and pancreas.  CONTROLLING FAT AND CHOLESTEROL WITH DIET  Although exercise and lifestyle factors are important, your diet is key. That is because certain foods are known to raise cholesterol and others to lower it. The goal is to balance foods for their effect on cholesterol and more importantly, to replace saturated and trans fat with other types of fat, such as monounsaturated fat, polyunsaturated fat, and omega-3 fatty acids.  On average, a person should consume no more than 15 to 17 g of saturated fat daily. Saturated and trans fats are considered "bad" fats, and they will raise LDL cholesterol. Saturated fats are primarily found in animal products such as meats, butter, and cream. However, that does not mean you need to give up all your favorite foods. Today, there are good tasting, low-fat, low-cholesterol substitutes for most of the things you like to eat. Choose low-fat or nonfat alternatives. Choose round or loin cuts of red meat. These types of cuts are lowest in fat and cholesterol. Chicken (without the skin), fish, veal, and ground turkey breast are great choices. Eliminate fatty meats, such as hot dogs and salami. Even shellfish have little or no saturated fat. Have a 3 oz (85 g) portion when you eat lean meat, poultry, or fish.  Trans fats are also called "partially hydrogenated oils." They are oils that have been scientifically manipulated so that they are solid at room temperature resulting in a longer shelf life and improved taste and texture of foods in which they are added. Trans fats are found in stick margarine, some tub margarines, cookies, crackers, and baked goods.   When baking and cooking, oils are a great substitute for butter. The monounsaturated oils are  especially beneficial since it is believed they lower LDL and raise HDL. The oils you should avoid entirely are saturated tropical oils, such as coconut and palm.   Remember to eat a lot from food groups that are naturally free of saturated and trans fat, including fish, fruit, vegetables, beans, grains (barley, rice, couscous, bulgur wheat), and pasta (without cream sauces).   IDENTIFYING FOODS THAT LOWER FAT AND CHOLESTEROL   Soluble fiber may lower your cholesterol. This type of fiber is found in fruits such as apples, vegetables such as broccoli, potatoes, and carrots, legumes such as beans, peas, and lentils, and grains such as barley. Foods fortified with plant sterols (phytosterol) may also lower cholesterol. You should eat at least 2 g per day of these foods for a cholesterol lowering effect.   Read package labels to identify low-saturated fats, trans fat free, and low-fat foods at the supermarket. Select cheeses that have only 2 to 3 g saturated fat per ounce. Use a heart-healthy tub margarine that is free of trans fats or partially hydrogenated oil. When buying baked goods (cookies, crackers), avoid partially hydrogenated oils. Breads and muffins should be made from whole grains (whole-wheat or whole oat flour, instead of "flour" or "enriched flour"). Buy non-creamy canned soups with reduced salt and no added fats.   FOOD PREPARATION TECHNIQUES   Never deep-fry. If you must fry, either stir-fry, which uses very little fat, or use non-stick cooking sprays. When possible, broil, bake, or roast meats, and steam vegetables. Instead of putting butter or margarine on vegetables, use lemon   and herbs, applesauce, and cinnamon (for squash and sweet potatoes). Use nonfat yogurt, salsa, and low-fat dressings for salads.   LOW-SATURATED FAT / LOW-FAT FOOD SUBSTITUTES  Meats / Saturated Fat (g)  · Avoid: Steak, marbled (3 oz/85 g) / 11 g  · Choose: Steak, lean (3 oz/85 g) / 4 g  · Avoid: Hamburger (3 oz/85 g) / 7  g  · Choose: Hamburger, lean (3 oz/85 g) / 5 g  · Avoid: Ham (3 oz/85 g) / 6 g  · Choose: Ham, lean cut (3 oz/85 g) / 2.4 g  · Avoid: Chicken, with skin, dark meat (3 oz/85 g) / 4 g  · Choose: Chicken, skin removed, dark meat (3 oz/85 g) / 2 g  · Avoid: Chicken, with skin, light meat (3 oz/85 g) / 2.5 g  · Choose: Chicken, skin removed, light meat (3 oz/85 g) / 1 g  Dairy / Saturated Fat (g)  · Avoid: Whole milk (1 cup) / 5 g  · Choose: Low-fat milk, 2% (1 cup) / 3 g  · Choose: Low-fat milk, 1% (1 cup) / 1.5 g  · Choose: Skim milk (1 cup) / 0.3 g  · Avoid: Hard cheese (1 oz/28 g) / 6 g  · Choose: Skim milk cheese (1 oz/28 g) / 2 to 3 g  · Avoid: Cottage cheese, 4% fat (1 cup) / 6.5 g  · Choose: Low-fat cottage cheese, 1% fat (1 cup) / 1.5 g  · Avoid: Ice cream (1 cup) / 9 g  · Choose: Sherbet (1 cup) / 2.5 g  · Choose: Nonfat frozen yogurt (1 cup) / 0.3 g  · Choose: Frozen fruit bar / trace  · Avoid: Whipped cream (1 tbs) / 3.5 g  · Choose: Nondairy whipped topping (1 tbs) / 1 g  Condiments / Saturated Fat (g)  · Avoid: Mayonnaise (1 tbs) / 2 g  · Choose: Low-fat mayonnaise (1 tbs) / 1 g  · Avoid: Butter (1 tbs) / 7 g  · Choose: Extra light margarine (1 tbs) / 1 g  · Avoid: Coconut oil (1 tbs) / 11.8 g  · Choose: Olive oil (1 tbs) / 1.8 g  · Choose: Corn oil (1 tbs) / 1.7 g  · Choose: Safflower oil (1 tbs) / 1.2 g  · Choose: Sunflower oil (1 tbs) / 1.4 g  · Choose: Soybean oil (1 tbs) / 2.4 g  · Choose: Canola oil (1 tbs) / 1 g  Document Released: 03/28/2005 Document Revised: 07/23/2012 Document Reviewed: 09/16/2010  ExitCare® Patient Information ©2014 [employer].

## 2013-03-16 LAB — FECAL OCCULT BLOOD, IMMUNOCHEMICAL: Fecal Occult Blood: NEGATIVE

## 2013-03-19 ENCOUNTER — Other Ambulatory Visit: Payer: Self-pay | Admitting: Nurse Practitioner

## 2013-03-19 MED ORDER — PHENTERMINE-TOPIRAMATE ER 3.75-23 MG PO CP24: 1.0000 | ORAL_CAPSULE | Freq: Every day | ORAL | Status: DC

## 2013-04-02 ENCOUNTER — Other Ambulatory Visit: Payer: Self-pay | Admitting: Nurse Practitioner

## 2013-04-08 ENCOUNTER — Telehealth: Payer: Self-pay | Admitting: *Deleted

## 2013-04-08 NOTE — Telephone Encounter (Signed)
Will need to be seen I am not there to help this week

## 2013-04-08 NOTE — Telephone Encounter (Signed)
Patient has gone through 2 rounds of mucinex and still continues to have cough. Do you think he needs to be seen again or can something be called in? Please advise

## 2013-04-08 NOTE — Telephone Encounter (Signed)
appt scheduled for wed at 9 with mae

## 2013-04-10 ENCOUNTER — Ambulatory Visit (INDEPENDENT_AMBULATORY_CARE_PROVIDER_SITE_OTHER): Payer: BC Managed Care – PPO | Admitting: General Practice

## 2013-04-10 ENCOUNTER — Encounter: Payer: Self-pay | Admitting: General Practice

## 2013-04-10 VITALS — BP 130/86 | HR 99 | Temp 98.2°F | Ht 70.0 in | Wt 250.0 lb

## 2013-04-10 DIAGNOSIS — R05 Cough: Secondary | ICD-10-CM

## 2013-04-10 DIAGNOSIS — J0100 Acute maxillary sinusitis, unspecified: Secondary | ICD-10-CM

## 2013-04-10 DIAGNOSIS — R059 Cough, unspecified: Secondary | ICD-10-CM

## 2013-04-10 DIAGNOSIS — J029 Acute pharyngitis, unspecified: Secondary | ICD-10-CM

## 2013-04-10 LAB — POCT RAPID STREP A (OFFICE): Rapid Strep A Screen: NEGATIVE

## 2013-04-10 MED ORDER — AZITHROMYCIN 250 MG PO TABS: ORAL_TABLET | ORAL | Status: DC

## 2013-04-10 MED ORDER — BENZONATATE 100 MG PO CAPS: 100.0000 mg | ORAL_CAPSULE | Freq: Two times a day (BID) | ORAL | Status: DC | PRN

## 2013-04-10 NOTE — Patient Instructions (Addendum)

## 2013-04-10 NOTE — Progress Notes (Signed)
   Subjective:    Patient ID: Matthew [name], male    DOB: 08/12/1955, 57 y.o.   MRN: [id number]  Cough This is a new problem. The current episode started 1 to 4 weeks ago. The problem has been unchanged. The cough is productive of sputum. Associated symptoms include nasal congestion, postnasal drip, a sore throat and shortness of breath. Pertinent negatives include no chest pain, chills, headaches or wheezing. The symptoms are aggravated by lying down. He has tried OTC cough suppressant for the symptoms. His past medical history is significant for bronchitis. There is no history of asthma or pneumonia.      Review of Systems  Constitutional: Negative for chills.  HENT: Positive for congestion, postnasal drip, sinus pressure and sore throat.   Respiratory: Positive for cough and shortness of breath. Negative for wheezing.   Cardiovascular: Negative for chest pain and palpitations.  Neurological: Negative for dizziness, weakness and headaches.       Objective:   Physical Exam  Constitutional: He is oriented to person, place, and time. He appears well-developed and well-nourished.  Cardiovascular: Normal rate, regular rhythm and normal heart sounds.   Pulmonary/Chest: Effort normal and breath sounds normal. No respiratory distress. He exhibits no tenderness.  Neurological: He is alert and oriented to person, place, and time.  Skin: Skin is warm and dry.  Psychiatric: He has a normal mood and affect.     Results for orders placed in visit on 04/10/13  POCT RAPID STREP A (OFFICE)      Result Value Range   Rapid Strep A Screen Negative  Negative       Assessment & Plan:  1. Sore throat  - POCT rapid strep A  2. Sinusitis, acute maxillary  - azithromycin (ZITHROMAX) 250 MG tablet; Take as directed  Dispense: 6 tablet; Refill: 0  3. Cough  - benzonatate (TESSALON) 100 MG capsule; Take 1 capsule (100 mg total) by mouth 2 (two) times daily as needed for cough.  Dispense: 20  capsule; Refill: 0 -adequate fluids -avoid irritants -Patient verbalized understanding Coralie Keens, FNP-C

## 2013-05-22 ENCOUNTER — Ambulatory Visit (INDEPENDENT_AMBULATORY_CARE_PROVIDER_SITE_OTHER): Payer: BC Managed Care – PPO | Admitting: General Practice

## 2013-05-22 ENCOUNTER — Encounter: Payer: Self-pay | Admitting: General Practice

## 2013-05-22 VITALS — BP 141/95 | HR 73 | Temp 98.2°F | Ht 70.0 in | Wt 234.5 lb

## 2013-05-22 DIAGNOSIS — Z862 Personal history of diseases of the blood and blood-forming organs and certain disorders involving the immune mechanism: Secondary | ICD-10-CM

## 2013-05-22 DIAGNOSIS — Z8639 Personal history of other endocrine, nutritional and metabolic disease: Secondary | ICD-10-CM

## 2013-05-22 DIAGNOSIS — M109 Gout, unspecified: Secondary | ICD-10-CM

## 2013-05-22 DIAGNOSIS — Z8739 Personal history of other diseases of the musculoskeletal system and connective tissue: Secondary | ICD-10-CM

## 2013-05-22 NOTE — Patient Instructions (Signed)

## 2013-05-22 NOTE — Progress Notes (Signed)
   Subjective:    Patient ID: Matthew [name] Yellin, male    DOB: 08/16/1955, 58 y.o.   MRN: [id number]  Foot Pain This is a new problem. The current episode started in the past 7 days. The problem has been gradually improving. Pertinent negatives include no chest pain, chills, fever or joint swelling. The symptoms are aggravated by standing. He has tried NSAIDs for the symptoms. The treatment provided moderate relief.  Reports a history of gout and this pain is similar, but in different location of foot. He has been taking indomethacin for 1 day and improvement noted.     Review of Systems  Constitutional: Negative for fever and chills.  Respiratory: Negative for chest tightness and shortness of breath.   Cardiovascular: Negative for chest pain and palpitations.  Musculoskeletal: Negative for joint swelling.       Right foot pain       Objective:   Physical Exam  Constitutional: He is oriented to person, place, and time. He appears well-developed and well-nourished.  Cardiovascular: Normal rate, regular rhythm and normal heart sounds.   Pulmonary/Chest: Effort normal and breath sounds normal. No respiratory distress. He exhibits no tenderness.  Musculoskeletal: He exhibits tenderness.  Right ankle tenderness, erythema, and warmth upon palpation.   Neurological: He is alert and oriented to person, place, and time.  Skin: Skin is warm and dry.  Psychiatric: He has a normal mood and affect.          Assessment & Plan:  1. Hx of gout  - Uric acid  2. Gout of ankle -Patient declines colcrys at this time, he well continue taking the indomethacin for next 3 days RTO if symptoms worsen or unresolved Will consider colcrys  Patient verbalized understanding Matthew KeensMae [name]. Ginny Loomer, FNP-C

## 2013-07-06 ENCOUNTER — Other Ambulatory Visit: Payer: Self-pay | Admitting: Nurse Practitioner

## 2013-07-08 NOTE — Telephone Encounter (Signed)
Patient of MMM. Last seen in office on 05-22-13 for an acute visit. Last chronic follow up on 03-15-14. Rx last filled on 05-08-13. Please advise. If approved please print and route to pool A so nurse can call patient to pick up

## 2013-07-08 NOTE — Telephone Encounter (Signed)
This prescription is okay to refill 

## 2013-08-27 ENCOUNTER — Other Ambulatory Visit: Payer: Self-pay | Admitting: Nurse Practitioner

## 2013-08-28 ENCOUNTER — Other Ambulatory Visit: Payer: Self-pay | Admitting: Nurse Practitioner

## 2013-08-28 MED ORDER — PHENTERMINE-TOPIRAMATE ER 7.5-46 MG PO CP24: 1.0000 | ORAL_CAPSULE | Freq: Every day | ORAL | Status: DC

## 2013-09-10 ENCOUNTER — Other Ambulatory Visit: Payer: Self-pay | Admitting: Nurse Practitioner

## 2013-10-09 ENCOUNTER — Other Ambulatory Visit: Payer: Self-pay | Admitting: Nurse Practitioner

## 2013-10-10 ENCOUNTER — Other Ambulatory Visit: Payer: Self-pay | Admitting: Nurse Practitioner

## 2013-10-10 MED ORDER — TADALAFIL 20 MG PO TABS: ORAL_TABLET | ORAL | Status: DC

## 2013-10-14 ENCOUNTER — Other Ambulatory Visit: Payer: Self-pay | Admitting: Family Medicine

## 2013-10-14 MED ORDER — PHENTERMINE-TOPIRAMATE ER 7.5-46 MG PO CP24: 1.0000 | ORAL_CAPSULE | Freq: Every day | ORAL | Status: DC

## 2013-10-16 ENCOUNTER — Encounter: Payer: Self-pay | Admitting: Nurse Practitioner

## 2013-10-17 NOTE — Telephone Encounter (Signed)
Last ov 2/15. Last refill 08/30/13. If approved print and call pt to pickup RX.

## 2013-10-21 ENCOUNTER — Telehealth: Payer: Self-pay | Admitting: Nurse Practitioner

## 2013-10-22 MED ORDER — INDOMETHACIN 50 MG PO CAPS: 50.0000 mg | ORAL_CAPSULE | Freq: Two times a day (BID) | ORAL | Status: DC

## 2013-10-22 NOTE — Telephone Encounter (Signed)
indomethacin sent the Rx to the pharmacy.

## 2013-10-22 NOTE — Telephone Encounter (Signed)
Left message on voicemail that medication sent to pharmacy.

## 2013-12-11 ENCOUNTER — Other Ambulatory Visit: Payer: Self-pay | Admitting: Family Medicine

## 2013-12-12 NOTE — Telephone Encounter (Signed)
Last ov 2/15. 

## 2013-12-12 NOTE — Telephone Encounter (Signed)
no more refills without being seen  

## 2014-02-10 ENCOUNTER — Telehealth: Payer: Self-pay | Admitting: *Deleted

## 2014-02-10 ENCOUNTER — Other Ambulatory Visit: Payer: Self-pay | Admitting: *Deleted

## 2014-02-10 MED ORDER — TESTOSTERONE 20.25 MG/ACT (1.62%) TD GEL: TRANSDERMAL | Status: DC

## 2014-02-10 NOTE — Telephone Encounter (Signed)
Testosterone script ready.

## 2014-02-10 NOTE — Telephone Encounter (Signed)
Patien last seen 05-22-13. Please advise on refill

## 2014-02-10 NOTE — Telephone Encounter (Signed)
rx ready for pickup 

## 2014-02-26 ENCOUNTER — Encounter: Payer: Self-pay | Admitting: Nurse Practitioner

## 2014-03-04 ENCOUNTER — Other Ambulatory Visit: Payer: Self-pay | Admitting: Nurse Practitioner

## 2014-03-05 ENCOUNTER — Other Ambulatory Visit: Payer: Self-pay | Admitting: *Deleted

## 2014-03-11 ENCOUNTER — Other Ambulatory Visit: Payer: Self-pay | Admitting: Nurse Practitioner

## 2014-07-07 ENCOUNTER — Other Ambulatory Visit: Payer: Self-pay | Admitting: *Deleted

## 2014-07-07 DIAGNOSIS — Z0000 Encounter for general adult medical examination without abnormal findings: Secondary | ICD-10-CM

## 2014-07-08 ENCOUNTER — Other Ambulatory Visit (INDEPENDENT_AMBULATORY_CARE_PROVIDER_SITE_OTHER): Payer: BC Managed Care – PPO

## 2014-07-08 DIAGNOSIS — Z0000 Encounter for general adult medical examination without abnormal findings: Secondary | ICD-10-CM

## 2014-07-08 NOTE — Progress Notes (Signed)
Lab only 

## 2014-07-09 LAB — CMP14+EGFR
ALT: 19 IU/L (ref 0–44)
AST: 20 IU/L (ref 0–40)
Albumin/Globulin Ratio: 2.3 (ref 1.1–2.5)
Albumin: 4.5 g/dL (ref 3.5–5.5)
Alkaline Phosphatase: 54 IU/L (ref 39–117)
BUN/Creatinine Ratio: 15 (ref 9–20)
BUN: 18 mg/dL (ref 6–24)
Bilirubin Total: 0.4 mg/dL (ref 0.0–1.2)
CO2: 27 mmol/L (ref 18–29)
Calcium: 9.8 mg/dL (ref 8.7–10.2)
Chloride: 102 mmol/L (ref 97–108)
Creatinine, Ser: 1.2 mg/dL (ref 0.76–1.27)
GFR calc Af Amer: 77 mL/min/{1.73_m2} (ref >59)
GFR calc non Af Amer: 67 mL/min/{1.73_m2} (ref >59)
Globulin, Total: 2 g/dL (ref 1.5–4.5)
Glucose: 100 mg/dL — ABNORMAL HIGH (ref 65–99)
Potassium: 5.4 mmol/L — ABNORMAL HIGH (ref 3.5–5.2)
Sodium: 142 mmol/L (ref 134–144)
Total Protein: 6.5 g/dL (ref 6.0–8.5)

## 2014-07-09 LAB — THYROID PANEL WITH TSH
Free Thyroxine Index: 2.9 (ref 1.2–4.9)
T3 Uptake Ratio: 31 % (ref 24–39)
T4, Total: 9.3 ug/dL (ref 4.5–12.0)
TSH: 1.86 u[IU]/mL (ref 0.450–4.500)

## 2014-07-09 LAB — NMR, LIPOPROFILE
Cholesterol: 141 mg/dL (ref 100–199)
HDL Cholesterol by NMR: 42 mg/dL (ref >39)
HDL Particle Number: 33.3 umol/L (ref >=30.5)
LDL Particle Number: 1146 nmol/L — ABNORMAL HIGH (ref <1000)
LDL Size: 20.1 nm (ref >20.5)
LDL-C: 69 mg/dL (ref 0–99)
LP-IR Score: 77 — ABNORMAL HIGH (ref <=45)
Small LDL Particle Number: 715 nmol/L — ABNORMAL HIGH (ref <=527)
Triglycerides by NMR: 149 mg/dL (ref 0–149)

## 2014-07-09 LAB — PSA, TOTAL AND FREE
PSA, Free Pct: 26.7 %
PSA, Free: 0.16 ng/mL
PSA: 0.6 ng/mL (ref 0.0–4.0)

## 2014-07-09 LAB — TESTOSTERONE,FREE AND TOTAL
Testosterone, Free: 9.2 pg/mL (ref 7.2–24.0)
Testosterone: 356 ng/dL (ref 348–1197)

## 2014-07-09 LAB — VITAMIN D 25 HYDROXY (VIT D DEFICIENCY, FRACTURES): Vit D, 25-Hydroxy: 38 ng/mL (ref 30.0–100.0)

## 2014-09-07 ENCOUNTER — Other Ambulatory Visit: Payer: Self-pay | Admitting: Nurse Practitioner

## 2014-09-09 NOTE — Telephone Encounter (Signed)
rx ready for pick up no more refills without being seen  

## 2014-09-09 NOTE — Telephone Encounter (Signed)
[facility]

## 2014-09-11 ENCOUNTER — Other Ambulatory Visit: Payer: Self-pay | Admitting: Nurse Practitioner

## 2014-09-24 ENCOUNTER — Encounter: Payer: Self-pay | Admitting: Gastroenterology

## 2014-10-06 ENCOUNTER — Other Ambulatory Visit: Payer: Self-pay | Admitting: Nurse Practitioner

## 2014-10-16 ENCOUNTER — Other Ambulatory Visit: Payer: Self-pay

## 2014-10-16 ENCOUNTER — Telehealth: Payer: Self-pay | Admitting: Nurse Practitioner

## 2014-10-16 MED ORDER — INDOMETHACIN 50 MG PO CAPS: 50.0000 mg | ORAL_CAPSULE | Freq: Two times a day (BID) | ORAL | Status: DC

## 2014-10-16 NOTE — Telephone Encounter (Signed)
Last seen 05/22/13  Matthew [name]

## 2014-10-28 ENCOUNTER — Other Ambulatory Visit: Payer: Self-pay | Admitting: Family Medicine

## 2014-12-11 ENCOUNTER — Encounter: Payer: Self-pay | Admitting: Nurse Practitioner

## 2014-12-11 ENCOUNTER — Ambulatory Visit (INDEPENDENT_AMBULATORY_CARE_PROVIDER_SITE_OTHER): Payer: BC Managed Care – PPO | Admitting: Nurse Practitioner

## 2014-12-11 VITALS — BP 136/82 | HR 83 | Temp 97.5°F | Ht 70.0 in | Wt 261.0 lb

## 2014-12-11 DIAGNOSIS — N529 Male erectile dysfunction, unspecified: Secondary | ICD-10-CM

## 2014-12-11 DIAGNOSIS — Z6837 Body mass index (BMI) 37.0-37.9, adult: Secondary | ICD-10-CM | POA: Diagnosis not present

## 2014-12-11 DIAGNOSIS — E785 Hyperlipidemia, unspecified: Secondary | ICD-10-CM | POA: Diagnosis not present

## 2014-12-11 DIAGNOSIS — K219 Gastro-esophageal reflux disease without esophagitis: Secondary | ICD-10-CM | POA: Diagnosis not present

## 2014-12-11 DIAGNOSIS — I10 Essential (primary) hypertension: Secondary | ICD-10-CM | POA: Diagnosis not present

## 2014-12-11 DIAGNOSIS — M1A9XX0 Chronic gout, unspecified, without tophus (tophi): Secondary | ICD-10-CM

## 2014-12-11 DIAGNOSIS — E291 Testicular hypofunction: Secondary | ICD-10-CM | POA: Diagnosis not present

## 2014-12-11 MED ORDER — TESTOSTERONE 20.25 MG/ACT (1.62%) TD GEL: TRANSDERMAL | Status: DC

## 2014-12-11 MED ORDER — TADALAFIL 20 MG PO TABS: ORAL_TABLET | ORAL | Status: DC

## 2014-12-11 MED ORDER — OMEPRAZOLE 40 MG PO CPDR: 40.0000 mg | DELAYED_RELEASE_CAPSULE | Freq: Every day | ORAL | Status: DC

## 2014-12-11 MED ORDER — BENAZEPRIL HCL 40 MG PO TABS: 40.0000 mg | ORAL_TABLET | Freq: Every day | ORAL | Status: DC

## 2014-12-11 MED ORDER — ROSUVASTATIN CALCIUM 10 MG PO TABS: 10.0000 mg | ORAL_TABLET | Freq: Every day | ORAL | Status: DC

## 2014-12-11 MED ORDER — INDOMETHACIN 50 MG PO CAPS: 50.0000 mg | ORAL_CAPSULE | Freq: Two times a day (BID) | ORAL | Status: DC

## 2014-12-11 NOTE — Progress Notes (Signed)
Subjective:    Patient ID: Matthew [name], male    DOB: 09-25-1955, 59 y.o.   MRN: [id number]   Patient here today for follow up of chronic medical problems. Labs done at work- brought results with him.   Hypertension This is a chronic problem. The current episode started more than 1 year ago. The problem is unchanged. The problem is controlled. Risk factors for coronary artery disease include dyslipidemia, obesity and male gender. Past treatments include ACE inhibitors. The current treatment provides moderate improvement. Compliance problems include diet and exercise.  There is no history of CAD/MI or CVA.  Hyperlipidemia This is a chronic problem. The current episode started more than 1 year ago. The problem is controlled. Recent lipid tests were reviewed and are normal. Exacerbating diseases include obesity. He has no history of diabetes or hypothyroidism. Current antihyperlipidemic treatment includes statins. The current treatment provides moderate improvement of lipids. Compliance problems include adherence to diet and adherence to exercise.  Risk factors for coronary artery disease include dyslipidemia, hypertension, male sex and obesity.  Gout Takes indomethacin as needed GERD On omperazole which keeps symptoms under control ED Takes cialis as needed- no side effects hypogonadism Testosterone gel helps with fatigue when he remembers to use it.   Review of Systems  Constitutional: Negative.   HENT: Negative.   Respiratory: Negative.   Cardiovascular: Negative.   Gastrointestinal: Negative.   Genitourinary: Negative.   Musculoskeletal: Negative.   Neurological: Negative.   Psychiatric/Behavioral: Negative.   All other systems reviewed and are negative.      Objective:   Physical Exam  Constitutional: He is oriented to person, place, and time. He appears well-developed and well-nourished.  HENT:  Head: Normocephalic.  Right Ear: External ear normal.  Left Ear: External  ear normal.  Nose: Nose normal.  Mouth/Throat: Oropharynx is clear and moist.  Eyes: EOM are normal. Pupils are equal, round, and reactive to light.  Neck: Normal range of motion. Neck supple. No JVD present. No thyromegaly present.  Cardiovascular: Normal rate, regular rhythm, normal heart sounds and intact distal pulses.  Exam reveals no gallop and no friction rub.   No murmur heard. Pulmonary/Chest: Effort normal and breath sounds normal. No respiratory distress. He has no wheezes. He has no rales. He exhibits no tenderness.  Abdominal: Soft. Bowel sounds are normal. He exhibits no mass. There is no tenderness.  Genitourinary: Prostate normal and penis normal.  Musculoskeletal: Normal range of motion. He exhibits no edema.  Lymphadenopathy:    He has no cervical adenopathy.  Neurological: He is alert and oriented to person, place, and time. No cranial nerve deficit.  Skin: Skin is warm and dry.  Psychiatric: He has a normal mood and affect. His behavior is normal. Judgment and thought content normal.   BP 136/82 mmHg  Pulse 83  Temp(Src) 97.5 F (36.4 C) (Oral)  Ht  (1.778 m)  Wt 261 lb (118.389 kg)  BMI 37.45 kg/m2         Assessment & Plan:  1. Essential hypertension Do not add salt to diet - benazepril (LOTENSIN) 40 MG tablet; Take 1 tablet (40 mg total) by mouth daily.  Dispense: 90 tablet; Refill: 1  2. Erectile dysfunction, unspecified erectile dysfunction type - tadalafil (CIALIS) 20 MG tablet; TAKE ONE TABLET BY MOUTH AS NEEDED  Dispense: 36 tablet; Refill: 1  3. Chronic gout without tophus, unspecified cause, unspecified site - indomethacin (INDOCIN) 50 MG capsule; Take 1 capsule (50 mg total)  by mouth 2 (two) times daily with a meal.  Dispense: 30 capsule; Refill: 5  4. Hyperlipidemia with target LDL less than 100 Low fat diet - rosuvastatin (CRESTOR) 10 MG tablet; Take 1 tablet (10 mg total) by mouth daily.  Dispense: 90 tablet; Refill: 1  5.  Gastroesophageal reflux disease without esophagitis Avoid spicy foods Do not eat 2 hours prior to bedtime - omeprazole (PRILOSEC) 40 MG capsule; Take 1 capsule (40 mg total) by mouth daily.  Dispense: 90 capsule; Refill: 1  6. BMI 37.0-37.9, adult Discussed diet and exercise for person with BMI >25 Will recheck weight in 3-6 months   7. Hypogonadism in male - Testosterone (ANDROGEL PUMP) 20.25 MG/ACT (1.62%) GEL; PLACE 2 SQUITS TOPICALLY ONTO THE SKIN ONCE DAILY  Dispense: 225 g; Refill: 1   Health maintence reviewed Diet and exercise encouraged RTO in 3  Months  Mary-Margaret Daphine Deutscher, FNP

## 2014-12-11 NOTE — Patient Instructions (Signed)
Exercise to Stay Healthy Exercise helps you become and stay healthy. EXERCISE IDEAS AND TIPS Choose exercises that:  You enjoy.  Fit into your day. You do not need to exercise really hard to be healthy. You can do exercises at a slow or medium level and stay healthy. You can:  Stretch before and after working out.  Try yoga, Pilates, or tai chi.  Lift weights.  Walk fast, swim, jog, run, climb stairs, bicycle, dance, or rollerskate.  Take aerobic classes. Exercises that burn about 150 calories:  Running 1  miles in 15 minutes.  Playing volleyball for 45 to 60 minutes.  Washing and waxing a car for 45 to 60 minutes.  Playing touch football for 45 minutes.  Walking 1  miles in 35 minutes.  Pushing a stroller 1  miles in 30 minutes.  Playing basketball for 30 minutes.  Raking leaves for 30 minutes.  Bicycling 5 miles in 30 minutes.  Walking 2 miles in 30 minutes.  Dancing for 30 minutes.  Shoveling snow for 15 minutes.  Swimming laps for 20 minutes.  Walking up stairs for 15 minutes.  Bicycling 4 miles in 15 minutes.  Gardening for 30 to 45 minutes.  Jumping rope for 15 minutes.  Washing windows or floors for 45 to 60 minutes. Document Released: 04/30/2010 Document Revised: 06/20/2011 Document Reviewed: 04/30/2010 Exit[employer] Patient Information 2015 [employer]. This information is not intended to replace advice given to you by your health care provider. Make sure you discuss any questions you have with your health care provider.  

## 2014-12-18 ENCOUNTER — Other Ambulatory Visit: Payer: Self-pay

## 2014-12-18 DIAGNOSIS — M1A9XX0 Chronic gout, unspecified, without tophus (tophi): Secondary | ICD-10-CM

## 2014-12-18 MED ORDER — INDOMETHACIN 50 MG PO CAPS: 50.0000 mg | ORAL_CAPSULE | Freq: Two times a day (BID) | ORAL | Status: DC

## 2015-01-22 ENCOUNTER — Other Ambulatory Visit: Payer: Self-pay

## 2015-01-22 DIAGNOSIS — I10 Essential (primary) hypertension: Secondary | ICD-10-CM

## 2015-01-22 DIAGNOSIS — E291 Testicular hypofunction: Secondary | ICD-10-CM

## 2015-01-22 DIAGNOSIS — K219 Gastro-esophageal reflux disease without esophagitis: Secondary | ICD-10-CM

## 2015-01-22 DIAGNOSIS — E785 Hyperlipidemia, unspecified: Secondary | ICD-10-CM

## 2015-01-22 MED ORDER — OMEPRAZOLE 40 MG PO CPDR: 40.0000 mg | DELAYED_RELEASE_CAPSULE | Freq: Every day | ORAL | Status: DC

## 2015-01-22 MED ORDER — BENAZEPRIL HCL 40 MG PO TABS: 40.0000 mg | ORAL_TABLET | Freq: Every day | ORAL | Status: DC

## 2015-01-22 MED ORDER — TESTOSTERONE 20.25 MG/ACT (1.62%) TD GEL: TRANSDERMAL | Status: DC

## 2015-01-22 MED ORDER — ROSUVASTATIN CALCIUM 10 MG PO TABS: 10.0000 mg | ORAL_TABLET | Freq: Every day | ORAL | Status: DC

## 2015-01-22 NOTE — Telephone Encounter (Signed)
Last seen 12/11/14 MMM  If approved print for mail order 

## 2015-01-22 NOTE — Telephone Encounter (Signed)
Last seen 12/11/14 MMM  If approved print for mail order

## 2015-01-22 NOTE — Telephone Encounter (Signed)
Seen 12/11/14  MMM If approved print for mail order

## 2015-02-15 ENCOUNTER — Other Ambulatory Visit: Payer: Self-pay | Admitting: Family Medicine

## 2015-02-18 ENCOUNTER — Other Ambulatory Visit: Payer: Self-pay | Admitting: Nurse Practitioner

## 2015-02-18 MED ORDER — TADALAFIL 20 MG PO TABS: 10.0000 mg | ORAL_TABLET | ORAL | Status: DC | PRN

## 2015-03-03 ENCOUNTER — Telehealth: Payer: Self-pay | Admitting: Nurse Practitioner

## 2015-03-04 NOTE — Telephone Encounter (Signed)
Called [facility] and got Indomethacin & Omeprazole prior authorization.  Patient contacted and informed medications were ready for him to call and order and they would be sent to him.

## 2015-03-19 ENCOUNTER — Ambulatory Visit: Payer: BC Managed Care – PPO | Admitting: Nurse Practitioner

## 2015-04-19 ENCOUNTER — Encounter: Payer: Self-pay | Admitting: Nurse Practitioner

## 2015-04-29 ENCOUNTER — Telehealth: Payer: Self-pay | Admitting: Nurse Practitioner

## 2015-05-05 ENCOUNTER — Telehealth: Payer: Self-pay

## 2015-05-05 NOTE — Telephone Encounter (Signed)
[employer] approved Androgel

## 2015-07-14 ENCOUNTER — Ambulatory Visit (INDEPENDENT_AMBULATORY_CARE_PROVIDER_SITE_OTHER): Payer: BC Managed Care – PPO | Admitting: Nurse Practitioner

## 2015-07-14 ENCOUNTER — Ambulatory Visit (INDEPENDENT_AMBULATORY_CARE_PROVIDER_SITE_OTHER): Payer: BC Managed Care – PPO

## 2015-07-14 ENCOUNTER — Encounter: Payer: Self-pay | Admitting: Nurse Practitioner

## 2015-07-14 VITALS — BP 141/93 | HR 101 | Temp 98.9°F | Ht 70.0 in | Wt 269.0 lb

## 2015-07-14 DIAGNOSIS — Z0000 Encounter for general adult medical examination without abnormal findings: Secondary | ICD-10-CM | POA: Diagnosis not present

## 2015-07-14 DIAGNOSIS — E291 Testicular hypofunction: Secondary | ICD-10-CM

## 2015-07-14 DIAGNOSIS — Z125 Encounter for screening for malignant neoplasm of prostate: Secondary | ICD-10-CM

## 2015-07-14 DIAGNOSIS — E785 Hyperlipidemia, unspecified: Secondary | ICD-10-CM | POA: Diagnosis not present

## 2015-07-14 DIAGNOSIS — I10 Essential (primary) hypertension: Secondary | ICD-10-CM

## 2015-07-14 DIAGNOSIS — K219 Gastro-esophageal reflux disease without esophagitis: Secondary | ICD-10-CM

## 2015-07-14 DIAGNOSIS — N529 Male erectile dysfunction, unspecified: Secondary | ICD-10-CM

## 2015-07-14 DIAGNOSIS — M1A9XX0 Chronic gout, unspecified, without tophus (tophi): Secondary | ICD-10-CM

## 2015-07-14 DIAGNOSIS — J309 Allergic rhinitis, unspecified: Secondary | ICD-10-CM

## 2015-07-14 DIAGNOSIS — Z1212 Encounter for screening for malignant neoplasm of rectum: Secondary | ICD-10-CM

## 2015-07-14 DIAGNOSIS — Z1159 Encounter for screening for other viral diseases: Secondary | ICD-10-CM

## 2015-07-14 MED ORDER — FLUTICASONE PROPIONATE 50 MCG/ACT NA SUSP: 2.0000 | Freq: Every day | NASAL | Status: DC

## 2015-07-14 MED ORDER — BENAZEPRIL HCL 40 MG PO TABS: 40.0000 mg | ORAL_TABLET | Freq: Every day | ORAL | Status: DC

## 2015-07-14 MED ORDER — TESTOSTERONE 20.25 MG/ACT (1.62%) TD GEL: TRANSDERMAL | Status: DC

## 2015-07-14 MED ORDER — PANTOPRAZOLE SODIUM 40 MG PO TBEC: 40.0000 mg | DELAYED_RELEASE_TABLET | Freq: Every day | ORAL | Status: DC

## 2015-07-14 MED ORDER — ROSUVASTATIN CALCIUM 10 MG PO TABS: 10.0000 mg | ORAL_TABLET | Freq: Every day | ORAL | Status: DC

## 2015-07-14 MED ORDER — TADALAFIL 20 MG PO TABS: ORAL_TABLET | ORAL | Status: DC

## 2015-07-14 MED ORDER — OMEPRAZOLE 40 MG PO CPDR: 40.0000 mg | DELAYED_RELEASE_CAPSULE | Freq: Every day | ORAL | Status: DC

## 2015-07-14 NOTE — Progress Notes (Signed)
Subjective:    Patient ID: Matthew [name], male    DOB: 1956-02-25, 60 y.o.   MRN: [id number]   Patient here today for CPE and follow up of chronic medical problems.  Outpatient Encounter Prescriptions as of 07/14/2015  Medication Sig  . benazepril (LOTENSIN) 40 MG tablet Take 1 tablet (40 mg total) by mouth daily.  . indomethacin (INDOCIN) 50 MG capsule TAKE 1 CAPSULE TWICE A DAY WITH MEALS  . omeprazole (PRILOSEC) 40 MG capsule Take 1 capsule (40 mg total) by mouth daily.  . Phentermine-Topiramate (QSYMIA) 7.5-46 MG CP24 Take 1 tablet by mouth daily.  . rosuvastatin (CRESTOR) 10 MG tablet Take 1 tablet (10 mg total) by mouth daily.  . tadalafil (CIALIS) 20 MG tablet TAKE ONE TABLET BY MOUTH AS NEEDED  . tadalafil (CIALIS) 20 MG tablet Take 0.5-1 tablets (10-20 mg total) by mouth every other day as needed for erectile dysfunction.  . Testosterone (ANDROGEL PUMP) 20.25 MG/ACT (1.62%) GEL PLACE 2 SQUITS TOPICALLY ONTO THE SKIN ONCE DAILY   No facility-administered encounter medications on file as of 07/14/2015.    * NAsal congestion that makes him feel SOB  Hypertension This is a chronic problem. The current episode started more than 1 year ago. The problem is unchanged. The problem is controlled. Risk factors for coronary artery disease include dyslipidemia, obesity and male gender. Past treatments include ACE inhibitors. The current treatment provides moderate improvement. Compliance problems include diet and exercise.  There is no history of CAD/MI or CVA.  Hyperlipidemia This is a chronic problem. The current episode started more than 1 year ago. The problem is controlled. Recent lipid tests were reviewed and are normal. Exacerbating diseases include obesity. He has no history of diabetes or hypothyroidism. Current antihyperlipidemic treatment includes statins. The current treatment provides moderate improvement of lipids. Compliance problems include adherence to diet and adherence to  exercise.  Risk factors for coronary artery disease include dyslipidemia, hypertension, male sex and obesity.  Gout Takes indomethacin as needed GERD On omperazole which is not  keeping symptoms under control ED Takes cialis as needed- no side effects hypogonadism Testosterone gel helps with fatigue when he remembers to use it.   Review of Systems  Constitutional: Negative.   HENT: Negative.   Respiratory: Negative.   Cardiovascular: Negative.   Gastrointestinal: Negative.   Genitourinary: Negative.   Musculoskeletal: Negative.   Neurological: Negative.   Psychiatric/Behavioral: Negative.   All other systems reviewed and are negative.      Objective:   Physical Exam  Constitutional: He is oriented to person, place, and time. He appears well-developed and well-nourished.  HENT:  Head: Normocephalic.  Right Ear: External ear normal.  Left Ear: External ear normal.  Nose: Nose normal.  Mouth/Throat: Oropharynx is clear and moist.  Eyes: EOM are normal. Pupils are equal, round, and reactive to light.  Neck: Normal range of motion. Neck supple. No JVD present. No thyromegaly present.  Cardiovascular: Normal rate, regular rhythm, normal heart sounds and intact distal pulses.  Exam reveals no gallop and no friction rub.   No murmur heard. Pulmonary/Chest: Effort normal and breath sounds normal. No respiratory distress. He has no wheezes. He has no rales. He exhibits no tenderness.  Abdominal: Soft. Bowel sounds are normal. He exhibits no mass. There is no tenderness.  Genitourinary: Prostate normal and penis normal.  Musculoskeletal: Normal range of motion. He exhibits no edema.  Lymphadenopathy:    He has no cervical adenopathy.  Neurological: He is  alert and oriented to person, place, and time. No cranial nerve deficit.  Skin: Skin is warm and dry.  Psychiatric: He has a normal mood and affect. His behavior is normal. Judgment and thought content normal.   BP 141/93 mmHg   Pulse 101  Temp(Src) 98.9 F (37.2 C) (Oral)  Ht 5' 10"  (1.778 m)  Wt 269 lb (122.018 kg)  BMI 38.60 kg/m2  Chest x ray- no cardiopulmonary abnormalities-Preliminary reading by Ronnald Collum, FNP  [facility]   EKG- Kerry Hough, FNP      Assessment & Plan:  1. Essential hypertension Do not add salt to diet - CMP14+EGFR - DG Chest 2 View; Future - EKG 12-Lead - benazepril (LOTENSIN) 40 MG tablet; Take 1 tablet (40 mg total) by mouth daily.  Dispense: 90 tablet; Refill: 1  2. Erectile dysfunction, unspecified erectile dysfunction type - tadalafil (CIALIS) 20 MG tablet; TAKE ONE TABLET BY MOUTH AS NEEDED  Dispense: 36 tablet; Refill: 1  3. Chronic gout without tophus, unspecified cause, unspecified site  4. Hyperlipidemia with target LDL less than 100 Low fat diet - Lipid panel - rosuvastatin (CRESTOR) 10 MG tablet; Take 1 tablet (10 mg total) by mouth daily.  Dispense: 90 tablet; Refill: 1  5. Morbid obesity, unspecified obesity type ([place]) Discussed diet and exercise for person with BMI >25 Will recheck weight in 3-6 months  6. Prostate cancer screening - PSA, total and free  7. Annual physical exam - CBC with Differential/Platelet  8. Screening for malignant neoplasm of the rectum - Fecal occult blood, imunochemical; Future  9. Need for hepatitis C screening test - Hepatitis C antibody  10. Gastroesophageal reflux disease without esophagitis Avoid spicy foods Do not eat 2 hours prior to bedtime - pantoprazole (PROTONIX) 40 MG tablet; Take 1 tablet (40 mg total) by mouth daily.  Dispense: 30 tablet; Refill: 3  11. Hypogonadism in male - Testosterone (ANDROGEL PUMP) 20.25 MG/ACT (1.62%) GEL; PLACE 2 SQUITS TOPICALLY ONTO THE SKIN ONCE DAILY  Dispense: 225 g; Refill: 1  12. Allergic rhinitis, unspecified allergic rhinitis type - fluticasone (FLONASE) 50 MCG/ACT nasal spray; Place 2 sprays into both nostrils daily.  Dispense: 16 g; Refill: 6    Labs  pending Health maintenance reviewed Diet and exercise encouraged Continue all meds Follow up  In 3 month   [place], FNP

## 2015-07-14 NOTE — Patient Instructions (Signed)

## 2015-07-15 ENCOUNTER — Encounter: Payer: Self-pay | Admitting: Nurse Practitioner

## 2015-07-20 ENCOUNTER — Other Ambulatory Visit: Payer: BC Managed Care – PPO

## 2015-07-21 ENCOUNTER — Other Ambulatory Visit: Payer: Self-pay | Admitting: Nurse Practitioner

## 2015-07-21 DIAGNOSIS — E785 Hyperlipidemia, unspecified: Secondary | ICD-10-CM

## 2015-07-21 LAB — CMP14+EGFR
ALT: 35 IU/L (ref 0–44)
AST: 32 IU/L (ref 0–40)
Albumin/Globulin Ratio: 1.9 (ref 1.2–2.2)
Albumin: 4.3 g/dL (ref 3.5–5.5)
Alkaline Phosphatase: 60 IU/L (ref 39–117)
BUN/Creatinine Ratio: 14 (ref 9–20)
BUN: 16 mg/dL (ref 6–24)
Bilirubin Total: 0.7 mg/dL (ref 0.0–1.2)
CO2: 23 mmol/L (ref 18–29)
Calcium: 9.2 mg/dL (ref 8.7–10.2)
Chloride: 100 mmol/L (ref 96–106)
Creatinine, Ser: 1.11 mg/dL (ref 0.76–1.27)
GFR calc Af Amer: 84 mL/min/{1.73_m2} (ref >59)
GFR calc non Af Amer: 73 mL/min/{1.73_m2} (ref >59)
Globulin, Total: 2.3 g/dL (ref 1.5–4.5)
Glucose: 94 mg/dL (ref 65–99)
Potassium: 4.4 mmol/L (ref 3.5–5.2)
Sodium: 141 mmol/L (ref 134–144)
Total Protein: 6.6 g/dL (ref 6.0–8.5)

## 2015-07-21 LAB — LIPID PANEL
Chol/HDL Ratio: 5.3 ratio units — ABNORMAL HIGH (ref 0.0–5.0)
Cholesterol, Total: 158 mg/dL (ref 100–199)
HDL: 30 mg/dL — ABNORMAL LOW (ref >39)
LDL Calculated: 63 mg/dL (ref 0–99)
Triglycerides: 323 mg/dL — ABNORMAL HIGH (ref 0–149)
VLDL Cholesterol Cal: 65 mg/dL — ABNORMAL HIGH (ref 5–40)

## 2015-07-21 LAB — CBC WITH DIFFERENTIAL/PLATELET
Basophils Absolute: 0 10*3/uL (ref 0.0–0.2)
Basos: 0 %
EOS (ABSOLUTE): 0.2 10*3/uL (ref 0.0–0.4)
Eos: 3 %
Hematocrit: 46.6 % (ref 37.5–51.0)
Hemoglobin: 16 g/dL (ref 12.6–17.7)
Immature Grans (Abs): 0 10*3/uL (ref 0.0–0.1)
Immature Granulocytes: 0 %
Lymphocytes Absolute: 2.3 10*3/uL (ref 0.7–3.1)
Lymphs: 39 %
MCH: 29.3 pg (ref 26.6–33.0)
MCHC: 34.3 g/dL (ref 31.5–35.7)
MCV: 85 fL (ref 79–97)
Monocytes Absolute: 0.4 10*3/uL (ref 0.1–0.9)
Monocytes: 7 %
Neutrophils Absolute: 3.1 10*3/uL (ref 1.4–7.0)
Neutrophils: 51 %
Platelets: 278 10*3/uL (ref 150–379)
RBC: 5.47 x10E6/uL (ref 4.14–5.80)
RDW: 13.5 % (ref 12.3–15.4)
WBC: 6 10*3/uL (ref 3.4–10.8)

## 2015-07-21 LAB — HEPATITIS C ANTIBODY: Hep C Virus Ab: <0.1 s/co ratio (ref 0.0–0.9)

## 2015-07-21 LAB — PSA, TOTAL AND FREE
PSA, Free Pct: 26 %
PSA, Free: 0.13 ng/mL
Prostate Specific Ag, Serum: 0.5 ng/mL (ref 0.0–4.0)

## 2015-07-21 MED ORDER — FENOFIBRATE 160 MG PO TABS: 160.0000 mg | ORAL_TABLET | Freq: Every day | ORAL | Status: DC

## 2015-09-29 ENCOUNTER — Telehealth: Payer: Self-pay

## 2015-09-29 NOTE — Telephone Encounter (Signed)
Patient of MMM. He has been on Androgel for quite some time and when he went to pick up this month the tier had changed and it is now $340. His insurance prefers Axiron and wants to know if this can be sent in. He will still require a prior authorization but this will only cost him $30 a month. Please advise and route back to sender

## 2015-10-01 MED ORDER — TESTOSTERONE 30 MG/ACT TD SOLN: 30.0000 mg | Freq: Every day | TRANSDERMAL | Status: DC

## 2015-10-01 NOTE — Telephone Encounter (Signed)
Patient aware and rx called into pharmacy.  

## 2015-10-02 ENCOUNTER — Telehealth: Payer: Self-pay

## 2015-10-02 NOTE — Telephone Encounter (Signed)
Insurance authorized Axiron for 3 months

## 2015-11-01 ENCOUNTER — Encounter: Payer: Self-pay | Admitting: Nurse Practitioner

## 2015-11-02 ENCOUNTER — Other Ambulatory Visit: Payer: Self-pay | Admitting: Nurse Practitioner

## 2015-11-02 ENCOUNTER — Telehealth: Payer: Self-pay | Admitting: Nurse Practitioner

## 2015-11-02 DIAGNOSIS — E785 Hyperlipidemia, unspecified: Secondary | ICD-10-CM

## 2015-11-02 DIAGNOSIS — R7989 Other specified abnormal findings of blood chemistry: Secondary | ICD-10-CM

## 2015-11-02 DIAGNOSIS — I10 Essential (primary) hypertension: Secondary | ICD-10-CM

## 2015-11-03 ENCOUNTER — Other Ambulatory Visit: Payer: BC Managed Care – PPO

## 2015-11-03 DIAGNOSIS — E785 Hyperlipidemia, unspecified: Secondary | ICD-10-CM

## 2015-11-03 DIAGNOSIS — R7989 Other specified abnormal findings of blood chemistry: Secondary | ICD-10-CM

## 2015-11-03 DIAGNOSIS — Z1212 Encounter for screening for malignant neoplasm of rectum: Secondary | ICD-10-CM

## 2015-11-03 DIAGNOSIS — I10 Essential (primary) hypertension: Secondary | ICD-10-CM

## 2015-11-04 LAB — CMP14+EGFR
ALT: 16 IU/L (ref 0–44)
AST: 18 IU/L (ref 0–40)
Albumin/Globulin Ratio: 2.1 (ref 1.2–2.2)
Albumin: 4.5 g/dL (ref 3.5–5.5)
Alkaline Phosphatase: 53 IU/L (ref 39–117)
BUN/Creatinine Ratio: 18 (ref 9–20)
BUN: 22 mg/dL (ref 6–24)
Bilirubin Total: 0.6 mg/dL (ref 0.0–1.2)
CO2: 24 mmol/L (ref 18–29)
Calcium: 9.8 mg/dL (ref 8.7–10.2)
Chloride: 100 mmol/L (ref 96–106)
Creatinine, Ser: 1.21 mg/dL (ref 0.76–1.27)
GFR calc Af Amer: 76 mL/min/{1.73_m2} (ref >59)
GFR calc non Af Amer: 66 mL/min/{1.73_m2} (ref >59)
Globulin, Total: 2.1 g/dL (ref 1.5–4.5)
Glucose: 83 mg/dL (ref 65–99)
Potassium: 4.6 mmol/L (ref 3.5–5.2)
Sodium: 141 mmol/L (ref 134–144)
Total Protein: 6.6 g/dL (ref 6.0–8.5)

## 2015-11-04 LAB — FECAL OCCULT BLOOD, IMMUNOCHEMICAL: Fecal Occult Bld: NEGATIVE

## 2015-11-04 LAB — LIPID PANEL
Chol/HDL Ratio: 3.3 ratio units (ref 0.0–5.0)
Cholesterol, Total: 127 mg/dL (ref 100–199)
HDL: 39 mg/dL — ABNORMAL LOW (ref >39)
LDL Calculated: 63 mg/dL (ref 0–99)
Triglycerides: 123 mg/dL (ref 0–149)
VLDL Cholesterol Cal: 25 mg/dL (ref 5–40)

## 2015-11-04 LAB — TESTOSTERONE,FREE AND TOTAL
Testosterone, Free: 8.3 pg/mL (ref 7.2–24.0)
Testosterone: 294 ng/dL (ref 264–916)

## 2015-11-12 ENCOUNTER — Ambulatory Visit: Payer: BC Managed Care – PPO | Admitting: Nurse Practitioner

## 2015-11-16 ENCOUNTER — Ambulatory Visit: Payer: Self-pay | Admitting: Nurse Practitioner

## 2015-11-18 ENCOUNTER — Other Ambulatory Visit: Payer: Self-pay

## 2015-11-18 DIAGNOSIS — K219 Gastro-esophageal reflux disease without esophagitis: Secondary | ICD-10-CM

## 2015-11-18 MED ORDER — PANTOPRAZOLE SODIUM 40 MG PO TBEC: 40.0000 mg | DELAYED_RELEASE_TABLET | Freq: Every day | ORAL | 3 refills | Status: DC

## 2015-11-24 ENCOUNTER — Other Ambulatory Visit: Payer: Self-pay | Admitting: *Deleted

## 2015-11-24 DIAGNOSIS — K219 Gastro-esophageal reflux disease without esophagitis: Secondary | ICD-10-CM

## 2015-11-24 MED ORDER — INDOMETHACIN 50 MG PO CAPS: ORAL_CAPSULE | ORAL | 0 refills | Status: DC

## 2015-11-24 MED ORDER — PANTOPRAZOLE SODIUM 40 MG PO TBEC: 40.0000 mg | DELAYED_RELEASE_TABLET | Freq: Every day | ORAL | 0 refills | Status: DC

## 2016-01-22 ENCOUNTER — Ambulatory Visit (INDEPENDENT_AMBULATORY_CARE_PROVIDER_SITE_OTHER): Payer: BC Managed Care – PPO | Admitting: *Deleted

## 2016-01-22 DIAGNOSIS — Z111 Encounter for screening for respiratory tuberculosis: Secondary | ICD-10-CM | POA: Diagnosis not present

## 2016-01-22 NOTE — Progress Notes (Signed)
Pt given PPD. Tolerated well.

## 2016-01-25 LAB — TB SKIN TEST
Induration: 0 mm
TB Skin Test: NEGATIVE

## 2016-02-05 ENCOUNTER — Other Ambulatory Visit: Payer: Self-pay | Admitting: *Deleted

## 2016-02-05 DIAGNOSIS — N529 Male erectile dysfunction, unspecified: Secondary | ICD-10-CM

## 2016-02-05 MED ORDER — TADALAFIL 20 MG PO TABS: ORAL_TABLET | ORAL | 5 refills | Status: DC

## 2016-02-05 NOTE — Telephone Encounter (Signed)
Last filled 11/18/15, last seen 07/14/15

## 2016-02-25 ENCOUNTER — Telehealth: Payer: Self-pay | Admitting: *Deleted

## 2016-02-26 ENCOUNTER — Other Ambulatory Visit: Payer: Self-pay | Admitting: Nurse Practitioner

## 2016-02-26 MED ORDER — INDOMETHACIN 50 MG PO CAPS: ORAL_CAPSULE | ORAL | 0 refills | Status: DC

## 2016-02-26 NOTE — Telephone Encounter (Signed)
done

## 2016-03-01 ENCOUNTER — Other Ambulatory Visit: Payer: Self-pay | Admitting: *Deleted

## 2016-03-01 ENCOUNTER — Other Ambulatory Visit: Payer: Self-pay | Admitting: Nurse Practitioner

## 2016-03-01 DIAGNOSIS — K219 Gastro-esophageal reflux disease without esophagitis: Secondary | ICD-10-CM

## 2016-03-01 MED ORDER — PANTOPRAZOLE SODIUM 40 MG PO TBEC: 40.0000 mg | DELAYED_RELEASE_TABLET | Freq: Every day | ORAL | 0 refills | Status: DC

## 2016-05-09 ENCOUNTER — Other Ambulatory Visit: Payer: Self-pay | Admitting: Nurse Practitioner

## 2016-05-09 DIAGNOSIS — E785 Hyperlipidemia, unspecified: Secondary | ICD-10-CM

## 2016-05-12 ENCOUNTER — Ambulatory Visit (INDEPENDENT_AMBULATORY_CARE_PROVIDER_SITE_OTHER): Payer: BC Managed Care – PPO | Admitting: Family Medicine

## 2016-05-12 ENCOUNTER — Encounter: Payer: Self-pay | Admitting: Family Medicine

## 2016-05-12 VITALS — BP 118/79 | HR 95 | Temp 100.8°F | Ht 70.0 in | Wt 210.8 lb

## 2016-05-12 DIAGNOSIS — J111 Influenza due to unidentified influenza virus with other respiratory manifestations: Secondary | ICD-10-CM

## 2016-05-12 MED ORDER — OSELTAMIVIR PHOSPHATE 75 MG PO CAPS: 75.0000 mg | ORAL_CAPSULE | Freq: Two times a day (BID) | ORAL | 0 refills | Status: DC

## 2016-05-12 NOTE — Progress Notes (Signed)
   HPI  Patient presents today here with flulike symptoms.  Patient explains that he's had symptoms for 1 day including fatigue, chills, body aches, headache, cough, and fever measured to 1021 day ago.  He denies any dyspnea or chest pain.  He is tolerating food and fluids normally.  He's had one confirmed influenza sick contact that he spent time with this weekend.  PMH: Smoking status noted ROS: Per HPI  Objective: BP 118/79   Pulse 95   Temp (!) 100.8 F (38.2 C) (Oral)   Ht 5\' 10"  (1.778 m)   Wt 210 lb 12.8 oz (95.6 kg)   BMI 30.25 kg/m  Gen: NAD, alert, cooperative with exam HEENT: NCAT, moist oral mucosa CV: RRR, good S1/S2, no murmur Resp: CTABL, no wheezes, non-labored Ext: No edema, warm Neuro: Alert and oriented, No gross deficits  Assessment and plan:  # Influenza Clinical diagnosis  Tx with Tamiflu, discussed supportive care at length Note written for work discussed usual course of illness RTC with any concerns or worsening symptoms.    Meds ordered this encounter  Medications  . oseltamivir (TAMIFLU) 75 MG capsule    Sig: Take 1 capsule (75 mg total) by mouth 2 (two) times daily.    Dispense:  10 capsule    Refill:  0    Murtis SinkSam Amillia Biffle, MD [facility] 05/12/2016, 8:06 AM

## 2016-05-12 NOTE — Patient Instructions (Signed)
Great to see you!   Influenza, Adult Influenza, more commonly known as "the flu," is a viral infection that primarily affects the respiratory tract. The respiratory tract includes organs that help you breathe, such as the lungs, nose, and throat. The flu causes many common cold symptoms, as well as a high fever and body aches. The flu spreads easily from person to person (is contagious). Getting a flu shot (influenza vaccination) every year is the best way to prevent influenza. What are the causes? Influenza is caused by a virus. You can catch the virus by:  Breathing in droplets from an infected person's cough or sneeze.  Touching something that was recently contaminated with the virus and then touching your mouth, nose, or eyes. What increases the risk? The following factors may make you more likely to get the flu:  Not cleaning your hands frequently with soap and water or alcohol-based hand sanitizer.  Having close contact with many people during cold and flu season.  Touching your mouth, eyes, or nose without washing or sanitizing your hands first.  Not drinking enough fluids or not eating a healthy diet.  Not getting enough sleep or exercise.  Being under a high amount of stress.  Not getting a yearly (annual) flu shot. You may be at a higher risk of complications from the flu, such as a severe lung infection (pneumonia), if you:  Are over the age of 65.  Are pregnant.  Have a weakened disease-fighting system (immune system). You may have a weakened immune system if you:  Have HIV or AIDS.  Are undergoing chemotherapy.  Aretaking medicines that reduce the activity of (suppress) the immune system.  Have a long-term (chronic) illness, such as heart disease, kidney disease, diabetes, or lung disease.  Have a liver disorder.  Are obese.  Have anemia. What are the signs or symptoms? Symptoms of this condition typically last 4-10 days and may  include:  Fever.  Chills.  Headache, body aches, or muscle aches.  Sore throat.  Cough.  Runny or congested nose.  Chest discomfort and cough.  Poor appetite.  Weakness or tiredness (fatigue).  Dizziness.  Nausea or vomiting. How is this diagnosed? This condition may be diagnosed based on your medical history and a physical exam. Your health care provider may do a nose or throat swab test to confirm the diagnosis. How is this treated? If influenza is detected early, you can be treated with antiviral medicine that can reduce the length of your illness and the severity of your symptoms. This medicine may be given by mouth (orally) or through an IV tube that is inserted in one of your veins. The goal of treatment is to relieve symptoms by taking care of yourself at home. This may include taking over-the-counter medicines, drinking plenty of fluids, and adding humidity to the air in your home. In some cases, influenza goes away on its own. Severe influenza or complications from influenza may be treated in a hospital. Follow these instructions at home:  Take over-the-counter and prescription medicines only as told by your health care provider.  Use a cool mist humidifier to add humidity to the air in your home. This can make breathing easier.  Rest as needed.  Drink enough fluid to keep your urine clear or pale yellow.  Cover your mouth and nose when you cough or sneeze.  Wash your hands with soap and water often, especially after you cough or sneeze. If soap and water are not available, use   hand sanitizer.  Stay home from work or school as told by your health care provider. Unless you are visiting your health care provider, try to avoid leaving home until your fever has been gone for 24 hours without the use of medicine.  Keep all follow-up visits as told by your health care provider. This is important. How is this prevented?  Getting an annual flu shot is the best way to  avoid getting the flu. You may get the flu shot in late summer, fall, or winter. Ask your health care provider when you should get your flu shot.  Wash your hands often or use hand sanitizer often.  Avoid contact with people who are sick during cold and flu season.  Eat a healthy diet, drink plenty of fluids, get enough sleep, and exercise regularly. Contact a health care provider if:  You develop new symptoms.  You have:  Chest pain.  Diarrhea.  A fever.  Your cough gets worse.  You produce more mucus.  You feel nauseous or you vomit. Get help right away if:  You develop shortness of breath or difficulty breathing.  Your skin or nails turn a bluish color.  You have severe pain or stiffness in your neck.  You develop a sudden headache or sudden pain in your face or ear.  You cannot stop vomiting. This information is not intended to replace advice given to you by your health care provider. Make sure you discuss any questions you have with your health care provider. Document Released: 03/25/2000 Document Revised: 09/03/2015 Document Reviewed: 01/20/2015 Elsevier Interactive Patient Education  2017 Elsevier Inc.  

## 2016-05-13 ENCOUNTER — Encounter: Payer: Self-pay | Admitting: Family Medicine

## 2016-05-28 ENCOUNTER — Other Ambulatory Visit: Payer: Self-pay | Admitting: Nurse Practitioner

## 2016-05-28 DIAGNOSIS — I10 Essential (primary) hypertension: Secondary | ICD-10-CM

## 2016-09-27 ENCOUNTER — Encounter: Payer: Self-pay | Admitting: Family Medicine

## 2016-09-28 NOTE — Telephone Encounter (Signed)
Defer to PCP, she will be back from vacation in 3 days.   Murtis SinkSam Maat Kafer, MD [facility] 09/28/2016, 9:06 AM

## 2016-10-10 ENCOUNTER — Other Ambulatory Visit: Payer: Self-pay | Admitting: Nurse Practitioner

## 2016-10-11 NOTE — Telephone Encounter (Signed)
Last seen 05/12/16  MMM  If approved route to nurse to call into [facility]

## 2016-12-08 ENCOUNTER — Other Ambulatory Visit: Payer: Self-pay | Admitting: Nurse Practitioner

## 2016-12-16 ENCOUNTER — Other Ambulatory Visit: Payer: Self-pay | Admitting: Nurse Practitioner

## 2016-12-16 NOTE — Telephone Encounter (Signed)
It looks like PCP sent this in august, defer to her.,   Matthew SinkSam Massimo Sadlowski, MD [facility] 12/16/2016, 1:32 PM

## 2016-12-16 NOTE — Telephone Encounter (Signed)
Last seen 05/12/16  Dr Ermalinda MemosBradshaw  If approved route to [facility]

## 2016-12-19 NOTE — Telephone Encounter (Signed)
Paulene FloorMary Martin emailed pt Rx is at front desk ready for pickup

## 2017-01-07 ENCOUNTER — Other Ambulatory Visit: Payer: Self-pay | Admitting: Nurse Practitioner

## 2017-01-07 DIAGNOSIS — K219 Gastro-esophageal reflux disease without esophagitis: Secondary | ICD-10-CM

## 2017-03-16 ENCOUNTER — Other Ambulatory Visit: Payer: Self-pay

## 2017-03-16 DIAGNOSIS — Z0000 Encounter for general adult medical examination without abnormal findings: Secondary | ICD-10-CM

## 2017-03-16 DIAGNOSIS — I10 Essential (primary) hypertension: Secondary | ICD-10-CM

## 2017-04-03 ENCOUNTER — Other Ambulatory Visit: Payer: Self-pay | Admitting: Nurse Practitioner

## 2017-04-03 DIAGNOSIS — J309 Allergic rhinitis, unspecified: Secondary | ICD-10-CM

## 2017-04-14 ENCOUNTER — Other Ambulatory Visit: Payer: BC Managed Care – PPO

## 2017-04-14 DIAGNOSIS — Z0000 Encounter for general adult medical examination without abnormal findings: Secondary | ICD-10-CM

## 2017-04-14 DIAGNOSIS — Z1211 Encounter for screening for malignant neoplasm of colon: Secondary | ICD-10-CM

## 2017-04-14 NOTE — Addendum Note (Signed)
Addended by: Margorie JohnJOHNSON, Alba Kriesel M on: 04/14/2017 02:07 PM   Modules accepted: Orders

## 2017-04-15 LAB — CMP14+EGFR
ALT: 18 IU/L (ref 0–44)
AST: 18 IU/L (ref 0–40)
Albumin/Globulin Ratio: 1.8 (ref 1.2–2.2)
Albumin: 4.4 g/dL (ref 3.6–4.8)
Alkaline Phosphatase: 65 IU/L (ref 39–117)
BUN/Creatinine Ratio: 14 (ref 10–24)
BUN: 18 mg/dL (ref 8–27)
Bilirubin Total: 0.5 mg/dL (ref 0.0–1.2)
CO2: 22 mmol/L (ref 20–29)
Calcium: 9.5 mg/dL (ref 8.6–10.2)
Chloride: 105 mmol/L (ref 96–106)
Creatinine, Ser: 1.32 mg/dL — ABNORMAL HIGH (ref 0.76–1.27)
GFR calc Af Amer: 67 mL/min/{1.73_m2} (ref >59)
GFR calc non Af Amer: 58 mL/min/{1.73_m2} — ABNORMAL LOW (ref >59)
Globulin, Total: 2.5 g/dL (ref 1.5–4.5)
Glucose: 96 mg/dL (ref 65–99)
Potassium: 4.8 mmol/L (ref 3.5–5.2)
Sodium: 145 mmol/L — ABNORMAL HIGH (ref 134–144)
Total Protein: 6.9 g/dL (ref 6.0–8.5)

## 2017-04-15 LAB — CBC WITH DIFFERENTIAL/PLATELET
Basophils Absolute: 0 10*3/uL (ref 0.0–0.2)
Basos: 0 %
EOS (ABSOLUTE): 0.2 10*3/uL (ref 0.0–0.4)
Eos: 3 %
Hematocrit: 42.5 % (ref 37.5–51.0)
Hemoglobin: 14.6 g/dL (ref 13.0–17.7)
Immature Grans (Abs): 0 10*3/uL (ref 0.0–0.1)
Immature Granulocytes: 0 %
Lymphocytes Absolute: 2.6 10*3/uL (ref 0.7–3.1)
Lymphs: 39 %
MCH: 29.3 pg (ref 26.6–33.0)
MCHC: 34.4 g/dL (ref 31.5–35.7)
MCV: 85 fL (ref 79–97)
Monocytes Absolute: 0.3 10*3/uL (ref 0.1–0.9)
Monocytes: 5 %
Neutrophils Absolute: 3.6 10*3/uL (ref 1.4–7.0)
Neutrophils: 53 %
Platelets: 237 10*3/uL (ref 150–379)
RBC: 4.98 x10E6/uL (ref 4.14–5.80)
RDW: 13.2 % (ref 12.3–15.4)
WBC: 6.7 10*3/uL (ref 3.4–10.8)

## 2017-04-15 LAB — TESTOSTERONE: Testosterone: 379 ng/dL (ref 264–916)

## 2017-04-15 LAB — LIPID PANEL
Chol/HDL Ratio: 3.8 ratio (ref 0.0–5.0)
Cholesterol, Total: 160 mg/dL (ref 100–199)
HDL: 42 mg/dL (ref >39)
LDL Calculated: 83 mg/dL (ref 0–99)
Triglycerides: 175 mg/dL — ABNORMAL HIGH (ref 0–149)
VLDL Cholesterol Cal: 35 mg/dL (ref 5–40)

## 2017-04-15 LAB — PSA, TOTAL AND FREE
PSA, Free Pct: 43.3 %
PSA, Free: 0.26 ng/mL
Prostate Specific Ag, Serum: 0.6 ng/mL (ref 0.0–4.0)

## 2017-04-16 LAB — FECAL OCCULT BLOOD, IMMUNOCHEMICAL: Fecal Occult Bld: POSITIVE — AB

## 2017-04-20 ENCOUNTER — Encounter: Payer: Self-pay | Admitting: Family Medicine

## 2017-04-20 ENCOUNTER — Ambulatory Visit (INDEPENDENT_AMBULATORY_CARE_PROVIDER_SITE_OTHER): Payer: BC Managed Care – PPO | Admitting: Family Medicine

## 2017-04-20 ENCOUNTER — Encounter: Payer: Self-pay | Admitting: Gastroenterology

## 2017-04-20 VITALS — BP 160/91 | HR 91 | Temp 97.1°F | Ht 70.0 in | Wt 252.6 lb

## 2017-04-20 DIAGNOSIS — N529 Male erectile dysfunction, unspecified: Secondary | ICD-10-CM

## 2017-04-20 DIAGNOSIS — Z0000 Encounter for general adult medical examination without abnormal findings: Secondary | ICD-10-CM

## 2017-04-20 DIAGNOSIS — R195 Other fecal abnormalities: Secondary | ICD-10-CM

## 2017-04-20 DIAGNOSIS — I10 Essential (primary) hypertension: Secondary | ICD-10-CM

## 2017-04-20 DIAGNOSIS — J309 Allergic rhinitis, unspecified: Secondary | ICD-10-CM

## 2017-04-20 DIAGNOSIS — E785 Hyperlipidemia, unspecified: Secondary | ICD-10-CM

## 2017-04-20 DIAGNOSIS — K219 Gastro-esophageal reflux disease without esophagitis: Secondary | ICD-10-CM

## 2017-04-20 MED ORDER — SILDENAFIL CITRATE 20 MG PO TABS: ORAL_TABLET | ORAL | 5 refills | Status: DC

## 2017-04-20 MED ORDER — FLUTICASONE PROPIONATE 50 MCG/ACT NA SUSP: 2.0000 | Freq: Every day | NASAL | 4 refills | Status: DC

## 2017-04-20 MED ORDER — DEXLANSOPRAZOLE 60 MG PO CPDR: 60.0000 mg | DELAYED_RELEASE_CAPSULE | Freq: Every day | ORAL | 3 refills | Status: DC

## 2017-04-20 MED ORDER — ROSUVASTATIN CALCIUM 10 MG PO TABS: 10.0000 mg | ORAL_TABLET | Freq: Every day | ORAL | 3 refills | Status: DC

## 2017-04-20 NOTE — Patient Instructions (Signed)
Great to see you!  Stop Protonix, start Dexilant, 1 pill once daily.  I have referred you back to GI.  I have also prescribed generic sildenafil to try instead of Cialis.

## 2017-04-20 NOTE — Progress Notes (Signed)
   HPI  Patient presents today here for an annual physical exam.  Patient feels well, he understands that he is having a difficult time with his weight and wants to get back in shape.  He is watching his diet and exercising more.  He had a positive FOBT, he would like to see GI.  He is also had uncontrolled GERD lately, he states that he was able to stop PPI for several months and then had re-onset of symptoms.  He started Protonix back and has had some improvement of the burning pain but still has discomfort he associates with GERD.  Needs refill of Flonase for allergic rhinitis Needs refill of Crestor for hyperlipidemia. Labs reviewed in total today.  Cialis is difficult to afford, he would like a more affordable option.  PMH: Smoking status noted ROS: Per HPI  Objective: BP (!) 160/91   Pulse 91   Temp (!) 97.1 F (36.2 C) (Oral)   Ht 5\' 10"  (1.778 m)   Wt 252 lb 9.6 oz (114.6 kg)   BMI 36.24 kg/m  Gen: NAD, alert, cooperative with exam HEENT: NCAT, oropharynx clear  CV: RRR, good S1/S2, no murmur Resp: CTABL, no wheezes, non-labored Ext: No edema, warm Neuro: Alert and oriented, No gross deficits  Assessment and plan:  #Annual physical exam Normal exam except for weight, discussed therapeutic lifestyle changes.   #Erectile dysfunction Trial of generic sildenafil, discussed not covered by insurance and off label.  #Hyperlipidemia LDL controlled, refill Crestor  #GERD Uncontrolled with PPI Refer to GI, escalate to Dexilant  Positive fecal occult blood test -Refer to GI  Hypertension Well-controlled, no changes, labs up-to-date with slight decrease in his creatinine which was discussed today   Orders Placed This Encounter  Procedures  . Ambulatory referral to Gastroenterology    Referral Priority:   Routine    Referral Type:   Consultation    Referral Reason:   Specialty Services Required    Number of Visits Requested:   1    Meds ordered this  encounter  Medications  . fluticasone (FLONASE) 50 MCG/ACT nasal spray    Sig: Place 2 sprays into both nostrils daily.    Dispense:  16 g    Refill:  4  . rosuvastatin (CRESTOR) 10 MG tablet    Sig: Take 1 tablet (10 mg total) by mouth daily.    Dispense:  90 tablet    Refill:  3  . sildenafil (REVATIO) 20 MG tablet    Sig: 2-5 tabs once daily as needed for ED    Dispense:  50 tablet    Refill:  5  . dexlansoprazole (DEXILANT) 60 MG capsule    Sig: Take 1 capsule (60 mg total) by mouth daily.    Dispense:  30 capsule    Refill:  3    Murtis SinkSam Alisa Stjames, MD [facility] 04/20/2017, 2:48 PM      Here to follow-up for chronic medical conditions.

## 2017-05-24 ENCOUNTER — Encounter: Payer: Self-pay | Admitting: Family Medicine

## 2017-05-30 ENCOUNTER — Encounter: Payer: Self-pay | Admitting: Gastroenterology

## 2017-05-30 ENCOUNTER — Ambulatory Visit: Payer: BC Managed Care – PPO | Admitting: Gastroenterology

## 2017-05-30 VITALS — BP 118/70 | HR 72 | Ht 70.0 in | Wt 239.0 lb

## 2017-05-30 DIAGNOSIS — R195 Other fecal abnormalities: Secondary | ICD-10-CM | POA: Diagnosis not present

## 2017-05-30 DIAGNOSIS — K219 Gastro-esophageal reflux disease without esophagitis: Secondary | ICD-10-CM | POA: Diagnosis not present

## 2017-05-30 MED ORDER — NA SULFATE-K SULFATE-MG SULF 17.5-3.13-1.6 GM/177ML PO SOLN: 1.0000 | Freq: Once | ORAL | 0 refills | Status: AC

## 2017-05-30 NOTE — Progress Notes (Signed)
History of Present Illness: This is a 62 year old male referred by Elenora GammaBradshaw, Samuel L, MD for the evaluation of occult blood in stool and GERD. Colonoscopy in 05/2009 was normal.  He relates a history of reflux problems for about the past 5 years.  He was maintained on pantoprazole for a number of years and recently was changed to Dexilant.  He thinks Dexilant may be slightly more effective.  His symptoms will be controlled for several weeks at a time and then he will have a day or two where his symptoms flare with epigastric pain, substernal burning, regurgitation and sore throat.  These symptoms are happening more frequently over the past several months.  He has no other gastrointestinal complaints. Denies weight loss, constipation, diarrhea, change in stool caliber, melena, hematochezia, nausea, vomiting, dysphagia, chest pain.   No Known Allergies Outpatient Medications Prior to Visit  Medication Sig Dispense Refill  . ANDROGEL PUMP 20.25 MG/ACT (1.62%) GEL apply TWO pumps TO SKIN ONCE daily as instructed 75 g 0  . benazepril (LOTENSIN) 40 MG tablet Take 1 Tablet by mouth once daily 90 tablet 1  . dexlansoprazole (DEXILANT) 60 MG capsule Take 1 capsule (60 mg total) by mouth daily. 30 capsule 3  . fluticasone (FLONASE) 50 MCG/ACT nasal spray Place 2 sprays into both nostrils daily. 16 g 4  . indomethacin (INDOCIN) 50 MG capsule TAKE 1 CAPSULE TWICE A DAY WITH MEALS 180 capsule 0  . rosuvastatin (CRESTOR) 10 MG tablet Take 1 tablet (10 mg total) by mouth daily. 90 tablet 3  . sildenafil (REVATIO) 20 MG tablet 2-5 tabs once daily as needed for ED 50 tablet 5  . pantoprazole (PROTONIX) 40 MG tablet Take 1 Tablet by mouth once daily (Patient not taking: Reported on 05/30/2017) 90 tablet 0   No facility-administered medications prior to visit.    Past Medical History:  Diagnosis Date  . ED (erectile dysfunction)   . GERD (gastroesophageal reflux disease)   . Gout   . Hyperlipidemia   .  Hypertension    Past Surgical History:  Procedure Laterality Date  . TONSILLECTOMY AND ADENOIDECTOMY  2009   Social History   Socioeconomic History  . Marital status: Married    Spouse name: None  . Number of children: None  . Years of education: None  . Highest education level: None  Social Needs  . Financial resource strain: None  . Food insecurity - worry: None  . Food insecurity - inability: None  . Transportation needs - medical: None  . Transportation needs - non-medical: None  Occupational History  . None  Tobacco Use  . Smoking status: Never Smoker  . Smokeless tobacco: Never Used  Substance and Sexual Activity  . Alcohol use: Yes    Comment: 2 per month  . Drug use: No  . Sexual activity: None  Other Topics Concern  . None  Social History Narrative  . None   Family History  Problem Relation Age of Onset  . Cancer Mother   . Heart attack Father   . Prostate cancer Paternal Uncle       Review of Systems: Pertinent positive and negative review of systems were noted in the above HPI section. All other review of systems were otherwise negative.  Physical Exam: General: Well developed, well nourished, no acute distress Head: Normocephalic and atraumatic Eyes:  sclerae anicteric, EOMI Ears: Normal auditory acuity Mouth: No deformity or lesions Neck: Supple, no masses or thyromegaly Lungs: Clear  throughout to auscultation Heart: Regular rate and rhythm; no murmurs, rubs or bruits Abdomen: Soft, non tender and non distended. No masses, hepatosplenomegaly or hernias noted. Normal Bowel sounds Rectal: not done Musculoskeletal: Symmetrical with no gross deformities  Skin: No lesions on visible extremities Pulses:  Normal pulses noted Extremities: No clubbing, cyanosis, edema or deformities noted Neurological: Alert oriented x 4, grossly nonfocal Cervical Nodes:  No significant cervical adenopathy Inguinal Nodes: No significant inguinal  adenopathy Psychological:  Alert and cooperative. Normal mood and affect  Assessment and Recommendations:  1. Occult blood in stool.  Rule out colorectal neoplasms, hemorrhoids and other disorders.  Schedule colonoscopy. The risks (including bleeding, perforation, infection, missed lesions, medication reactions and possible hospitalization or surgery if complications occur), benefits, and alternatives to colonoscopy with possible biopsy and possible polypectomy were discussed with the patient and they consent to proceed.   2. GERD.  Increasing frequency of symptoms.  Continue Dexilant 60 mg every morning.  Avoid eating or drinking anything but water for 3 hours prior to recumbency.  Tums as needed.  Follow standard antireflux measures.  Rule out esophagitis, Barrett's.  Schedule EGD. The risks (including bleeding, perforation, infection, missed lesions, medication reactions and possible hospitalization or surgery if complications occur), benefits, and alternatives to endoscopy with possible biopsy and possible dilation were discussed with the patient and they consent to proceed.     cc: Elenora Gamma, MD [address] [place], [place] [address]

## 2017-05-30 NOTE — Patient Instructions (Addendum)
You have been scheduled for an endoscopy and colonoscopy. Please follow the written instructions given to you at your visit today. Please pick up your prep supplies at the pharmacy within the next 1-3 days. If you use inhalers (even only as needed), please bring them with you on the day of your procedure. Your physician has requested that you go to www.startemmi.com and enter the access code given to you at your visit today. This web site gives a general overview about your procedure. However, you should still follow specific instructions given to you by our office regarding your preparation for the procedure.  Patient advised to avoid spicy, acidic, citrus, chocolate, mints, fruit and fruit juices.  Limit the intake of caffeine, alcohol and Soda.  Don't exercise too soon after eating.  Don't lie down within 3-4 hours of eating.  Elevate the head of your bed.  Normal BMI (Body Mass Index- based on height and weight) is between 19 and 25. Your BMI today is Body mass index is 34.29 kg/m. Marland Kitchen. Please consider follow up  regarding your BMI with your Primary Care Provider.  Thank you for choosing me and [facility].  Venita LickMalcolm T. Pleas KochStark, Jr., MD., Clementeen GrahamFACG

## 2017-06-02 ENCOUNTER — Encounter: Payer: Self-pay | Admitting: Gastroenterology

## 2017-06-05 ENCOUNTER — Other Ambulatory Visit: Payer: Self-pay | Admitting: Family Medicine

## 2017-06-05 DIAGNOSIS — I10 Essential (primary) hypertension: Secondary | ICD-10-CM

## 2017-06-09 HISTORY — PX: COLONOSCOPY: SHX174

## 2017-06-10 ENCOUNTER — Encounter: Payer: Self-pay | Admitting: Family Medicine

## 2017-06-13 ENCOUNTER — Encounter: Payer: BC Managed Care – PPO | Admitting: Gastroenterology

## 2017-06-22 ENCOUNTER — Other Ambulatory Visit: Payer: Self-pay | Admitting: Nurse Practitioner

## 2017-06-23 MED ORDER — TESTOSTERONE 20.25 MG/ACT (1.62%) TD GEL: 2.0000 | Freq: Every day | TRANSDERMAL | 0 refills | Status: DC

## 2017-07-23 ENCOUNTER — Other Ambulatory Visit: Payer: Self-pay | Admitting: Family Medicine

## 2017-07-24 MED ORDER — TESTOSTERONE 20.25 MG/ACT (1.62%) TD GEL: 2.0000 | Freq: Every day | TRANSDERMAL | 0 refills | Status: DC

## 2017-08-05 ENCOUNTER — Other Ambulatory Visit: Payer: Self-pay | Admitting: Family Medicine

## 2017-08-11 ENCOUNTER — Encounter: Payer: Self-pay | Admitting: Family Medicine

## 2017-08-11 ENCOUNTER — Ambulatory Visit: Payer: BC Managed Care – PPO | Admitting: Family Medicine

## 2017-08-11 VITALS — BP 132/93 | HR 89 | Temp 98.0°F | Ht 70.0 in | Wt 222.0 lb

## 2017-08-11 DIAGNOSIS — R350 Frequency of micturition: Secondary | ICD-10-CM

## 2017-08-11 DIAGNOSIS — R351 Nocturia: Secondary | ICD-10-CM

## 2017-08-11 DIAGNOSIS — N401 Enlarged prostate with lower urinary tract symptoms: Secondary | ICD-10-CM | POA: Diagnosis not present

## 2017-08-11 DIAGNOSIS — R3129 Other microscopic hematuria: Secondary | ICD-10-CM | POA: Diagnosis not present

## 2017-08-11 LAB — URINALYSIS, COMPLETE
Bilirubin, UA: NEGATIVE
Glucose, UA: NEGATIVE
Ketones, UA: NEGATIVE
Leukocytes, UA: NEGATIVE
Nitrite, UA: NEGATIVE
Protein, UA: NEGATIVE
Specific Gravity, UA: <1.005 — ABNORMAL LOW (ref 1.005–1.030)
Urobilinogen, Ur: 0.2 mg/dL (ref 0.2–1.0)
pH, UA: 6 (ref 5.0–7.5)

## 2017-08-11 LAB — MICROSCOPIC EXAMINATION
Bacteria, UA: NONE SEEN
Epithelial Cells (non renal): NONE SEEN /hpf (ref 0–10)
WBC, UA: NONE SEEN /hpf (ref 0–5)

## 2017-08-11 MED ORDER — TAMSULOSIN HCL 0.4 MG PO CAPS: 0.4000 mg | ORAL_CAPSULE | Freq: Every day | ORAL | 3 refills | Status: DC

## 2017-08-11 NOTE — Patient Instructions (Signed)
Great to see you!  Start flomax 1 pill once daily

## 2017-08-11 NOTE — Progress Notes (Signed)
   HPI  Patient presents today for urinary urgency and frequency.  Patient explains over the last month his symptoms have been getting worse.  They have been going on for several months.  He describes urinary frequency and urgency, 2-3 episodes of nocturia, and a sensation of incomplete emptying of the bladder.  He has not tried medications. He is treated with testosterone for hypogonadism, his last testosterone was well within therapeutic range  PMH: Smoking status noted ROS: Per HPI  Objective: BP (!) 132/93   Pulse 89   Temp 98 F (36.7 C) (Oral)   Ht  (1.778 m)   Wt 222 lb (100.7 kg)   BMI 31.85 kg/m  Gen: NAD, alert, cooperative with exam HEENT: NCAT CV: RRR, good S1/S2, no murmur Resp: CTABL, no wheezes, non-labored Ext: No edema, warm Neuro: Alert and oriented, No gross deficits DRE Smooth symmetric large prostate, no nodules  Assessment and plan:  #BPH PSA normal 4 months ago, DRE is consistent with BPH Starting Flomax Follow-up 6 weeks  Urinalysis shows microscopic hematuria, repeat urinalysis next visit Pt has 3-10 RBCs per high-powered field on arthroscopic exam Culture   Orders Placed This Encounter  Procedures  . Urinalysis, Complete    Meds ordered this encounter  Medications  . tamsulosin (FLOMAX) 0.4 MG CAPS capsule    Sig: Take 1 capsule (0.4 mg total) by mouth daily.    Dispense:  30 capsule    Refill:  3    Murtis Sink, MD [facility] 08/11/2017, 4:54 PM

## 2017-08-13 LAB — URINE CULTURE

## 2017-08-20 ENCOUNTER — Other Ambulatory Visit: Payer: Self-pay | Admitting: Family Medicine

## 2017-08-20 ENCOUNTER — Other Ambulatory Visit: Payer: Self-pay | Admitting: Physician Assistant

## 2017-08-20 DIAGNOSIS — I10 Essential (primary) hypertension: Secondary | ICD-10-CM

## 2017-08-21 MED ORDER — TESTOSTERONE 20.25 MG/ACT (1.62%) TD GEL: 2.0000 | Freq: Every day | TRANSDERMAL | 1 refills | Status: DC

## 2017-08-21 MED ORDER — BENAZEPRIL HCL 40 MG PO TABS: 40.0000 mg | ORAL_TABLET | Freq: Every day | ORAL | 0 refills | Status: DC

## 2017-08-21 NOTE — Telephone Encounter (Signed)
Please forward to [place], I did this in his absence

## 2017-09-15 ENCOUNTER — Other Ambulatory Visit: Payer: Self-pay | Admitting: Family Medicine

## 2017-09-15 DIAGNOSIS — J309 Allergic rhinitis, unspecified: Secondary | ICD-10-CM

## 2017-09-22 ENCOUNTER — Encounter: Payer: Self-pay | Admitting: Family Medicine

## 2017-09-22 ENCOUNTER — Ambulatory Visit: Payer: BC Managed Care – PPO | Admitting: Family Medicine

## 2017-09-22 VITALS — BP 127/83 | HR 78 | Temp 97.6°F | Ht 70.0 in | Wt 215.0 lb

## 2017-09-22 DIAGNOSIS — I10 Essential (primary) hypertension: Secondary | ICD-10-CM | POA: Diagnosis not present

## 2017-09-22 DIAGNOSIS — R3129 Other microscopic hematuria: Secondary | ICD-10-CM | POA: Diagnosis not present

## 2017-09-22 DIAGNOSIS — J309 Allergic rhinitis, unspecified: Secondary | ICD-10-CM | POA: Diagnosis not present

## 2017-09-22 DIAGNOSIS — N401 Enlarged prostate with lower urinary tract symptoms: Secondary | ICD-10-CM | POA: Diagnosis not present

## 2017-09-22 DIAGNOSIS — E785 Hyperlipidemia, unspecified: Secondary | ICD-10-CM | POA: Diagnosis not present

## 2017-09-22 DIAGNOSIS — R351 Nocturia: Secondary | ICD-10-CM | POA: Diagnosis not present

## 2017-09-22 LAB — MICROSCOPIC EXAMINATION
Bacteria, UA: NONE SEEN
Epithelial Cells (non renal): NONE SEEN /hpf (ref 0–10)
Renal Epithel, UA: NONE SEEN /hpf
WBC, UA: NONE SEEN /hpf (ref 0–5)

## 2017-09-22 LAB — URINALYSIS, COMPLETE
Bilirubin, UA: NEGATIVE
Glucose, UA: NEGATIVE
Ketones, UA: NEGATIVE
Leukocytes, UA: NEGATIVE
Nitrite, UA: NEGATIVE
Protein, UA: NEGATIVE
Specific Gravity, UA: 1.01 (ref 1.005–1.030)
Urobilinogen, Ur: 0.2 mg/dL (ref 0.2–1.0)
pH, UA: 6.5 (ref 5.0–7.5)

## 2017-09-22 MED ORDER — FLUTICASONE PROPIONATE 50 MCG/ACT NA SUSP: NASAL | 3 refills | Status: DC

## 2017-09-22 MED ORDER — ROSUVASTATIN CALCIUM 10 MG PO TABS: 10.0000 mg | ORAL_TABLET | Freq: Every day | ORAL | 3 refills | Status: DC

## 2017-09-22 MED ORDER — SILDENAFIL CITRATE 20 MG PO TABS: ORAL_TABLET | ORAL | 5 refills | Status: DC

## 2017-09-22 MED ORDER — INDOMETHACIN 50 MG PO CAPS: ORAL_CAPSULE | ORAL | 0 refills | Status: DC

## 2017-09-22 MED ORDER — BENAZEPRIL HCL 40 MG PO TABS: 40.0000 mg | ORAL_TABLET | Freq: Every day | ORAL | 3 refills | Status: DC

## 2017-09-22 MED ORDER — TESTOSTERONE 20.25 MG/ACT (1.62%) TD GEL: 2.0000 | Freq: Every day | TRANSDERMAL | 1 refills | Status: DC

## 2017-09-22 MED ORDER — TAMSULOSIN HCL 0.4 MG PO CAPS: 0.4000 mg | ORAL_CAPSULE | Freq: Every day | ORAL | 3 refills | Status: DC

## 2017-09-22 NOTE — Progress Notes (Signed)
   HPI  Patient presents today for follow-up chronic medical conditions.  BPH Doing very well with Flomax, reports almost complete symptom resolution  Hypertension Needs refills, doing well with medication Pressure was elevated at recent GI appointment but overall is okay.  Needs refills on most of his medications  Patient has not noticed any discrete blood in his urine.  No history of kidney stone  PMH: Smoking status noted ROS: Per HPI  Objective: BP 127/83 (BP Location: Right Arm)   Pulse 78   Temp 97.6 F (36.4 C) (Oral)   Ht 5\' 10"  (1.778 m)   Wt 215 lb (97.5 kg)   BMI 30.85 kg/m  Gen: NAD, alert, cooperative with exam HEENT: NCAT CV: RRR, good S1/S2, no murmur Resp: CTABL, no wheezes, non-labored Ext: No edema, warm Neuro: Alert and oriented, No gross deficits  Assessment and plan:  #microscopic hematuria Persistent 3-10 RBC/hpf, discussed with patient Culture negative Refer to urology, appreciate the recommendations and evaluation  #BPH Much improved on Flomax, no changes   #Hypertension Well-controlled, refill medication No changes  hyperlipidemia, allergic rhinitis, ED -medications refilled, labs in Dcember at follow up in 6 months    Orders Placed This Encounter  Procedures  . Urine Culture  . Urinalysis, Complete    Meds ordered this encounter  Medications  . Testosterone (ANDROGEL PUMP) 20.25 MG/ACT (1.62%) GEL    Sig: Apply 2 Pump topically daily.    Dispense:  75 g    Refill:  1  . benazepril (LOTENSIN) 40 MG tablet    Sig: Take 1 tablet (40 mg total) by mouth daily.    Dispense:  90 tablet    Refill:  3  . rosuvastatin (CRESTOR) 10 MG tablet    Sig: Take 1 tablet (10 mg total) by mouth daily.    Dispense:  90 tablet    Refill:  3  . fluticasone (FLONASE) 50 MCG/ACT nasal spray    Sig: SPRAY 2 SPRAYS INTO EACH NOSTRIL EVERY DAY    Dispense:  48 g    Refill:  3  . indomethacin (INDOCIN) 50 MG capsule    Sig: TAKE 1 CAPSULE  TWICE A DAY WITH MEALS    Dispense:  180 capsule    Refill:  0  . tamsulosin (FLOMAX) 0.4 MG CAPS capsule    Sig: Take 1 capsule (0.4 mg total) by mouth daily.    Dispense:  90 capsule    Refill:  3  . sildenafil (REVATIO) 20 MG tablet    Sig: 2-5 tabs once daily as needed for ED    Dispense:  50 tablet    Refill:  5    Murtis SinkSam Harry Shuck, MD [facility] 09/22/2017, 4:29 PM

## 2017-09-22 NOTE — Patient Instructions (Signed)
Great to see you!  Come back in 6 months to see Dr. Dettinger 

## 2017-09-24 LAB — URINE CULTURE: Organism ID, Bacteria: NO GROWTH

## 2017-10-05 ENCOUNTER — Telehealth: Payer: Self-pay | Admitting: Family Medicine

## 2017-10-05 NOTE — Telephone Encounter (Signed)
Appointment made for 10/06/17 at 10 am/ Information given to wife as per DPR and she states he is oot of town and will not be able to make that appointment. Number given to her to cancel and reschedule at a time convenient for pt

## 2017-10-20 ENCOUNTER — Other Ambulatory Visit: Payer: Self-pay | Admitting: Family Medicine

## 2017-10-20 MED ORDER — TESTOSTERONE 20.25 MG/ACT (1.62%) TD GEL: 2.0000 | Freq: Every day | TRANSDERMAL | 1 refills | Status: DC

## 2017-10-20 NOTE — Progress Notes (Signed)
Sent refill because Dr. Felipa EmoryBradshaw's prescriber status was still having issues. Matthew CareJoshua Axtyn Woehler, MD [facility] 10/20/2017, 4:32 PM

## 2017-11-08 ENCOUNTER — Other Ambulatory Visit: Payer: Self-pay | Admitting: Family Medicine

## 2017-12-15 ENCOUNTER — Other Ambulatory Visit: Payer: Self-pay | Admitting: Urology

## 2017-12-18 ENCOUNTER — Encounter (HOSPITAL_BASED_OUTPATIENT_CLINIC_OR_DEPARTMENT_OTHER): Payer: Self-pay

## 2017-12-19 ENCOUNTER — Encounter (HOSPITAL_BASED_OUTPATIENT_CLINIC_OR_DEPARTMENT_OTHER): Payer: Self-pay

## 2017-12-19 ENCOUNTER — Other Ambulatory Visit: Payer: Self-pay

## 2017-12-19 NOTE — Progress Notes (Signed)
Spoke with:  Sharl Ma NPO:  After Midnight, no gum, candy, or mints   Arrival time: 0900AM Labs: Istat 8, EKG AM medications:  Ranitidine Pre op orders: Yes Ride home:  Mindi Junker (wife) [phone]

## 2017-12-21 ENCOUNTER — Other Ambulatory Visit: Payer: Self-pay | Admitting: *Deleted

## 2017-12-21 MED ORDER — INDOMETHACIN 50 MG PO CAPS: ORAL_CAPSULE | ORAL | 0 refills | Status: DC

## 2017-12-21 NOTE — Telephone Encounter (Signed)
Next OV: 03/26/18

## 2017-12-25 NOTE — H&P (Signed)
Office Visit Report     12/07/2017   --------------------------------------------------------------------------------   Matthew [name]  MRN: [id number]  PRIMARY CARE:  [name], MD  DOB: 1955/05/16, 62 year old Male  REFERRING:  Kenn File, MD  SSN:   PROVIDER:  Festus Aloe, M.D.    LOCATION:  [facility] [phone]   --------------------------------------------------------------------------------   CC: I have hydronephrosis.  HPI: Matthew [name] is a 62 year-old male established patient who is here for hydronephrosis.  His problem was diagnosed 11/20/2017. The problem is on both sides. He had the following x-rays done: CT Scan.   CT A/P for hematuria 11/20/2017 revealed a distended bladder with about 2 L of urine and bilateral hydroureteronephrosis. Prostate 42 grams. His BUN was 30 and creatinine 1.9. He had been voiding and taking Flomax 0.4 mg qhs with improved flow. PVR > 999 ml and foley placed. He had gross hematuria likely from the distention.   He returns for cystoscopy. Renal ultrasound today showed some mild left greater than right hydronephrosis, but again this is mild and much improved compared to prior CT scan. Lateral was decompressed with a thick bladder wall.   He returns for renal US and cystoscopy.     ALLERGIES: None   MEDICATIONS: Androgel 20.25 mg/1.25 gram (1.62 %) gel in packet  Crestor 10 mg tablet  Benazepril Hcl 40 mg tablet  Children's Flonase Allergy Rlf 50 mcg/actuation spray, suspension  Flomax 0.4 mg capsule  Indocin 50 mg suppository, rectal  Ranitidine Hcl 150 mg capsule  Sildenafil 20 mg tablet  Tadalafil 20 mg tablet 1 tablet PO Daily PRN     GU PSH: Locm 300-399Mg/Ml Iodine,1Ml - 11/20/2017      PSH Notes: L Rotators Cuff  Sleep Apnea   NON-GU PSH: None   GU PMH: Hydronephrosis Unspec - 11/22/2017 BPH w/LUTS - 11/15/2017 ED due to arterial insufficiency - 11/15/2017 Microscopic hematuria -  11/15/2017 Urinary Frequency - 11/15/2017 Weak Urinary Stream - 11/15/2017    NON-GU PMH: GERD Gout Hypercholesterolemia Hypertension Sleep Apnea    FAMILY HISTORY: 3 Son's - No Family History Heart Disease - Father Prostate Cancer - Uncle Vulva cancer - Mother   SOCIAL HISTORY: Marital Status: Married Preferred Language: English; Race: White Current Smoking Status: Patient has never smoked.   Tobacco Use Assessment Completed: Used Tobacco in last 30 days? Drinks 2 drinks per week. Types of alcohol consumed: Wine.  Drinks 3 caffeinated drinks per day. Has not had a blood transfusion.    REVIEW OF SYSTEMS:    GU Review Male:   Patient denies frequent urination, hard to postpone urination, burning/ pain with urination, get up at night to urinate, leakage of urine, stream starts and stops, trouble starting your stream, have to strain to urinate , erection problems, and penile pain.  Gastrointestinal (Upper):   Patient denies nausea, vomiting, and indigestion/ heartburn.  Gastrointestinal (Lower):   Patient denies diarrhea and constipation.  Constitutional:   Patient denies fever, night sweats, weight loss, and fatigue.  Skin:   Patient denies skin rash/ lesion and itching.  Eyes:   Patient denies blurred vision and double vision.  Ears/ Nose/ Throat:   Patient denies sore throat and sinus problems.  Hematologic/Lymphatic:   Patient denies swollen glands and easy bruising.  Cardiovascular:   Patient denies leg swelling and chest pains.  Respiratory:   Patient denies shortness of breath and cough.  Endocrine:   Patient denies excessive thirst.  Musculoskeletal:   Patient reports  back pain. Patient denies joint pain.  Neurological:   Patient denies headaches and dizziness.  Psychologic:   Patient denies depression and anxiety.   VITAL SIGNS:      12/07/2017 10:56 AM  Weight 205 lb / 92.99 kg  Height 70 in / 177.8 cm  BP 156/90 mmHg  Heart Rate 80 /min  Temperature 98.6 F / 37 C   BMI 29.4 kg/m   GU PHYSICAL EXAMINATION:    Urethral Meatus: Normal size. No lesion, no wart, no discharge, no polyp. Normal location.  Penis: Circumcised, no warts, no cracks. No dorsal Peyronie's plaques, no left corporal Peyronie's plaques, no right corporal Peyronie's plaques, no scarring, no warts. No balanitis, no meatal stenosis.   MULTI-SYSTEM PHYSICAL EXAMINATION:    Constitutional: Well-nourished. No physical deformities. Normally developed. Good grooming.  Neck: Neck symmetrical, not swollen. Normal tracheal position.  Respiratory: No labored breathing, no use of accessory muscles.   Cardiovascular: Normal temperature, normal extremity pulses, no swelling, no varicosities.  Skin: No paleness, no jaundice, no cyanosis. No lesion, no ulcer, no rash.  Neurologic / Psychiatric: Oriented to time, oriented to place, oriented to person. No depression, no anxiety, no agitation.  Gastrointestinal: No mass, no tenderness, no rigidity, non obese abdomen.     PAST DATA REVIEWED:  Source Of History:  Patient  X-Ray Review: C.T. Abdomen/Pelvis: Reviewed Films. 11/2017    PROCEDURES:         Flexible Cystoscopy - 52000  Risks, benefits, and some of the potential complications of the procedure were discussed with the patient. All questions were answered. Informed consent was obtained. Antibiotic prophylaxis was given -- Cephalexin. Sterile technique and intraurethral analgesia were used.  Meatus:  Normal size. Normal location. Normal condition.  Urethra:  No strictures.  External Sphincter:  Normal.  Verumontanum:  Normal.  Prostate:  Obstructing. Enlarged median lobe. Moderate hyperplasia.  Bladder Neck:  Non-obstructing.  Ureteral Orifices:  Normal location. Normal size. Normal shape. Effluxed clear urine.  Bladder:  No trabeculation. No tumors. Normal mucosa. No stones. Bladder wall is edematous with diffuse glomerulations. No obvious tumor.      The lower urinary tract was  carefully examined. He was filled to 500 ml. The procedure was well-tolerated and without complications. Antibiotic instructions were given. Instructions were given to call the office immediately for bloody urine, difficulty urinating, painful urination, fever, chills, nausea, vomiting or other illness. The patient stated that he understood these instructions and would comply with them.          Renal Ultrasound - [id number]  Right Kidney: Length: 11.3 cm Depth:5.3 cm Cortical Width:1.7 cm Width:6.1 cm  Left Kidney: Length: 10.7 cm Depth:6.2 cm Cortical Width:1.4 cm Width: 5.6 cm  Left Kidney/Ureter:  Hydro noted, prox ureter= 1.06cm. ? Cystic area MP= 0.81cm, ? Echogenic focus MP vs calc= 0.33cm, ? Hyperechoic area UP vs part of the renal sinus= 0.75cm  Right Kidney/Ureter:  ? Hypoechoic area MP vs fullness= 1.6x1.8x1.5cm  Bladder:  Cath seen. ? Bladder wall appears thickened?      Increased bowel gas.        Simple Foley Catheterization - [id number]  A 16 French Foley catheter was inserted into the bladder using sterile technique. The patient was taught routine catheter care. Hand irrigation of the bladder with sterile water was performed. A leg bag was connected. 450 cc of urine was obtained.         Urinalysis w/Scope Dipstick Dipstick Cont'd Micro  Color: Yellow Bilirubin: Neg  mg/dL WBC/hpf: 20 - 40/hpf  Appearance: Cloudy Ketones: Neg mg/dL RBC/hpf: 3 - 10/hpf  Specific Gravity: 1.010 Blood: 2+ ery/uL Bacteria: Mod (26-50/hpf)  pH: 7.5 Protein: 2+ mg/dL Cystals: Triple Phos  Glucose: Neg mg/dL Urobilinogen: 0.2 mg/dL Casts: NS (Not Seen)    Nitrites: Neg Trichomonas: Not Present    Leukocyte Esterase: 3+ leu/uL Mucous: Present      Epithelial Cells: 0 - 5/hpf      Yeast: NS (Not Seen)      Sperm: Not Present    Notes: amended report called to nurse Whitney @ 9:30am    ASSESSMENT:      ICD-10 Details  1 GU:   Hydronephrosis Unspec - N13.30   2   Gross hematuria - R31.0   3    Incomplete bladder emptying - R39.14   4   BPH w/LUTS - N40.1 Stable   PLAN:           Schedule Return Visit/Planned Activity: Next Available Appointment - Schedule Surgery          Document Letter(s):  Created for Patient: Clinical Summary         Notes:   hydronephrosis - improved   retention - voiding trial today unsuccessful. A catheter replaced. We discussed returning for a nurse visit to learn CIC, but he declined.   gross hematuria - likely from bladder distention   BPH - on tamsulosin. He's a good candidate for thulium laser vaporization. He has a median lobe component and a slightly elongated prostatic urethra. We could also add finasteride and continue to monitor. We discussed these options in detail. I discussed with the patient the nature r/b/a to laser vaporization of prostate including side effects of the procedure, expected post-op course and likelihood of success. We discussed flow symptoms and irritative symptoms typically improve, but frequency and urgency can persist and rarely worsen. We also discussed risk of bleeding, infection, stricture, sexual dysfunction/Ret ejac and incontinence among others. All questions answered. He elects to proceed.   cc; Dr. Warrick Parisian     * Signed by Festus Aloe, M.D. on 12/07/17 at 2:49 PM (EDT)*     The information contained in this medical record document is considered private and confidential patient information. This information can only be used for the medical diagnosis and/or medical services that are being provided by the patient's selected caregivers. This information can only be distributed outside of the patient's care if the patient agrees and signs waivers of authorization for this information to be sent to an outside source or route.

## 2017-12-26 ENCOUNTER — Ambulatory Visit (HOSPITAL_BASED_OUTPATIENT_CLINIC_OR_DEPARTMENT_OTHER): Payer: BC Managed Care – PPO | Admitting: Anesthesiology

## 2017-12-26 ENCOUNTER — Encounter (HOSPITAL_BASED_OUTPATIENT_CLINIC_OR_DEPARTMENT_OTHER)
Admission: RE | Disposition: A | Discharge status: Home / Self Care | Payer: Self-pay | Source: Ambulatory Visit | Attending: Urology

## 2017-12-26 ENCOUNTER — Encounter (HOSPITAL_BASED_OUTPATIENT_CLINIC_OR_DEPARTMENT_OTHER): Payer: Self-pay

## 2017-12-26 ENCOUNTER — Other Ambulatory Visit: Payer: Self-pay

## 2017-12-26 ENCOUNTER — Ambulatory Visit (HOSPITAL_BASED_OUTPATIENT_CLINIC_OR_DEPARTMENT_OTHER)
Admission: RE | Admit: 2017-12-26 | Discharge: 2017-12-26 | Disposition: A | Discharge status: Home / Self Care | Payer: BC Managed Care – PPO | Source: Ambulatory Visit | Attending: Urology | Admitting: Urology

## 2017-12-26 DIAGNOSIS — R338 Other retention of urine: Secondary | ICD-10-CM | POA: Diagnosis not present

## 2017-12-26 DIAGNOSIS — R3914 Feeling of incomplete bladder emptying: Secondary | ICD-10-CM | POA: Diagnosis not present

## 2017-12-26 DIAGNOSIS — K219 Gastro-esophageal reflux disease without esophagitis: Secondary | ICD-10-CM | POA: Insufficient documentation

## 2017-12-26 DIAGNOSIS — Z79899 Other long term (current) drug therapy: Secondary | ICD-10-CM | POA: Diagnosis not present

## 2017-12-26 DIAGNOSIS — R310 Gross hematuria: Secondary | ICD-10-CM | POA: Diagnosis not present

## 2017-12-26 DIAGNOSIS — M109 Gout, unspecified: Secondary | ICD-10-CM | POA: Insufficient documentation

## 2017-12-26 DIAGNOSIS — N1330 Unspecified hydronephrosis: Secondary | ICD-10-CM | POA: Diagnosis not present

## 2017-12-26 DIAGNOSIS — N401 Enlarged prostate with lower urinary tract symptoms: Secondary | ICD-10-CM | POA: Diagnosis not present

## 2017-12-26 DIAGNOSIS — G4730 Sleep apnea, unspecified: Secondary | ICD-10-CM | POA: Diagnosis not present

## 2017-12-26 DIAGNOSIS — E7800 Pure hypercholesterolemia, unspecified: Secondary | ICD-10-CM | POA: Insufficient documentation

## 2017-12-26 DIAGNOSIS — N138 Other obstructive and reflux uropathy: Secondary | ICD-10-CM

## 2017-12-26 DIAGNOSIS — I10 Essential (primary) hypertension: Secondary | ICD-10-CM | POA: Diagnosis not present

## 2017-12-26 HISTORY — DX: Other fecal abnormalities: R19.5

## 2017-12-26 HISTORY — DX: Other microscopic hematuria: R31.29

## 2017-12-26 HISTORY — DX: Allergic rhinitis, unspecified: J30.9

## 2017-12-26 HISTORY — DX: Nocturia: R35.1

## 2017-12-26 HISTORY — DX: Urgency of urination: R39.15

## 2017-12-26 HISTORY — DX: Other constipation: K59.09

## 2017-12-26 HISTORY — DX: Benign prostatic hyperplasia without lower urinary tract symptoms: N40.0

## 2017-12-26 HISTORY — PX: THULIUM LASER TURP (TRANSURETHRAL RESECTION OF PROSTATE): SHX6744

## 2017-12-26 HISTORY — DX: Frequency of micturition: R35.0

## 2017-12-26 HISTORY — DX: Presence of urogenital implants: Z96.0

## 2017-12-26 HISTORY — DX: Personal history of other diseases of the nervous system and sense organs: Z86.69

## 2017-12-26 LAB — POCT I-STAT 4, (NA,K, GLUC, HGB,HCT)
Glucose, Bld: 105 mg/dL — ABNORMAL HIGH (ref 70–99)
HCT: 41 % (ref 39.0–52.0)
Hemoglobin: 13.9 g/dL (ref 13.0–17.0)
Potassium: 4.7 mmol/L (ref 3.5–5.1)
Sodium: 145 mmol/L (ref 135–145)

## 2017-12-26 SURGERY — THULIUM LASER TURP (TRANSURETHRAL RESECTION OF PROSTATE)
Anesthesia: General | Site: Prostate

## 2017-12-26 MED ORDER — BELLADONNA ALKALOIDS-OPIUM 16.2-60 MG RE SUPP
RECTAL | Status: AC
  Filled 2017-12-26: qty 1

## 2017-12-26 MED ORDER — KETOROLAC TROMETHAMINE 30 MG/ML IJ SOLN: INTRAMUSCULAR | Status: DC | PRN

## 2017-12-26 MED ORDER — FENTANYL CITRATE (PF) 100 MCG/2ML IJ SOLN
INTRAMUSCULAR | Status: AC
  Filled 2017-12-26: qty 2

## 2017-12-26 MED ORDER — ONDANSETRON HCL 4 MG/2ML IJ SOLN
INTRAMUSCULAR | Status: DC | PRN
  Administered 2017-12-26: 4 mg via INTRAVENOUS

## 2017-12-26 MED ORDER — OXYCODONE HCL 5 MG/5ML PO SOLN
5.0000 mg | Freq: Once | ORAL | Status: DC | PRN
  Filled 2017-12-26: qty 5

## 2017-12-26 MED ORDER — DEXAMETHASONE SODIUM PHOSPHATE 10 MG/ML IJ SOLN
INTRAMUSCULAR | Status: AC
  Filled 2017-12-26: qty 1

## 2017-12-26 MED ORDER — FENTANYL CITRATE (PF) 100 MCG/2ML IJ SOLN
25.0000 ug | INTRAMUSCULAR | Status: DC | PRN
  Filled 2017-12-26: qty 1

## 2017-12-26 MED ORDER — LIDOCAINE 2% (20 MG/ML) 5 ML SYRINGE
INTRAMUSCULAR | Status: AC
  Filled 2017-12-26: qty 5

## 2017-12-26 MED ORDER — PROPOFOL 10 MG/ML IV BOLUS
INTRAVENOUS | Status: AC
  Filled 2017-12-26: qty 20

## 2017-12-26 MED ORDER — OXYCODONE HCL 5 MG PO TABS
5.0000 mg | ORAL_TABLET | Freq: Once | ORAL | Status: DC | PRN
  Filled 2017-12-26: qty 1

## 2017-12-26 MED ORDER — ONDANSETRON HCL 4 MG/2ML IJ SOLN
4.0000 mg | Freq: Four times a day (QID) | INTRAMUSCULAR | Status: DC | PRN
  Filled 2017-12-26: qty 2

## 2017-12-26 MED ORDER — MIDAZOLAM HCL 2 MG/2ML IJ SOLN
INTRAMUSCULAR | Status: DC | PRN
  Administered 2017-12-26: 2 mg via INTRAVENOUS

## 2017-12-26 MED ORDER — CEPHALEXIN 500 MG PO CAPS: 500.0000 mg | ORAL_CAPSULE | Freq: Every day | ORAL | 0 refills | Status: DC

## 2017-12-26 MED ORDER — LIDOCAINE 2% (20 MG/ML) 5 ML SYRINGE
INTRAMUSCULAR | Status: DC | PRN
  Administered 2017-12-26: 60 mg via INTRAVENOUS

## 2017-12-26 MED ORDER — ONDANSETRON HCL 4 MG/2ML IJ SOLN
INTRAMUSCULAR | Status: AC
  Filled 2017-12-26: qty 2

## 2017-12-26 MED ORDER — FENTANYL CITRATE (PF) 100 MCG/2ML IJ SOLN
INTRAMUSCULAR | Status: DC | PRN
  Administered 2017-12-26: 25 ug via INTRAVENOUS
  Administered 2017-12-26: 50 ug via INTRAVENOUS
  Administered 2017-12-26: 25 ug via INTRAVENOUS

## 2017-12-26 MED ORDER — MIDAZOLAM HCL 2 MG/2ML IJ SOLN
INTRAMUSCULAR | Status: AC
  Filled 2017-12-26: qty 2

## 2017-12-26 MED ORDER — CEPHALEXIN 500 MG PO CAPS: 500.0000 mg | ORAL_CAPSULE | Freq: Every day | ORAL | 0 refills | Status: AC

## 2017-12-26 MED ORDER — CEFAZOLIN SODIUM-DEXTROSE 2-4 GM/100ML-% IV SOLN
INTRAVENOUS | Status: AC
  Filled 2017-12-26: qty 100

## 2017-12-26 MED ORDER — KETOROLAC TROMETHAMINE 30 MG/ML IJ SOLN
INTRAMUSCULAR | Status: AC
  Filled 2017-12-26: qty 1

## 2017-12-26 MED ORDER — DEXAMETHASONE SODIUM PHOSPHATE 10 MG/ML IJ SOLN
INTRAMUSCULAR | Status: DC | PRN
  Administered 2017-12-26: 10 mg via INTRAVENOUS

## 2017-12-26 MED ORDER — LACTATED RINGERS IV SOLN
INTRAVENOUS | Status: DC
  Administered 2017-12-26 (×2): via INTRAVENOUS
  Filled 2017-12-26: qty 1000

## 2017-12-26 MED ORDER — PROPOFOL 10 MG/ML IV BOLUS
INTRAVENOUS | Status: DC | PRN
  Administered 2017-12-26: 200 mg via INTRAVENOUS

## 2017-12-26 MED ORDER — SODIUM CHLORIDE 0.9 % IR SOLN
Status: DC | PRN
  Administered 2017-12-26 (×4): 3000 mL

## 2017-12-26 MED ORDER — CEFAZOLIN SODIUM-DEXTROSE 2-4 GM/100ML-% IV SOLN
2.0000 g | Freq: Once | INTRAVENOUS | Status: AC
  Administered 2017-12-26: 2 g via INTRAVENOUS
  Filled 2017-12-26: qty 100

## 2017-12-26 MED ORDER — BELLADONNA ALKALOIDS-OPIUM 16.2-60 MG RE SUPP
RECTAL | Status: DC | PRN
  Administered 2017-12-26: 1 via RECTAL

## 2017-12-26 SURGICAL SUPPLY — 23 items
BAG DRAIN URO-CYSTO SKYTR STRL (DRAIN) ×2 IMPLANT
BAG DRN UROCATH (DRAIN) ×1
BAG URINE DRAINAGE (UROLOGICAL SUPPLIES) ×2 IMPLANT
CATH COUDE FOLEY 2W 5CC 18FR (CATHETERS) ×1 IMPLANT
CATH FOLEY 3WAY 30CC 22F (CATHETERS) IMPLANT
CLOTH BEACON ORANGE TIMEOUT ST (SAFETY) ×2 IMPLANT
GLOVE BIO SURGEON STRL SZ 6.5 (GLOVE) ×1 IMPLANT
GLOVE BIO SURGEON STRL SZ7 (GLOVE) ×2 IMPLANT
GLOVE BIO SURGEON STRL SZ7.5 (GLOVE) ×2 IMPLANT
GLOVE BIOGEL PI IND STRL 7.5 (GLOVE) IMPLANT
GLOVE BIOGEL PI INDICATOR 7.5 (GLOVE) ×2
GOWN STRL REUS W/TWL LRG LVL3 (GOWN DISPOSABLE) ×3 IMPLANT
GOWN STRL REUS W/TWL XL LVL3 (GOWN DISPOSABLE) ×2 IMPLANT
HOLDER FOLEY CATH W/STRAP (MISCELLANEOUS) ×1 IMPLANT
IV NS IRRIG 3000ML ARTHROMATIC (IV SOLUTION) ×5 IMPLANT
KIT TURNOVER CYSTO (KITS) ×2 IMPLANT
LASER REVOLIX PROCEDURE (MISCELLANEOUS) ×3 IMPLANT
LOOP CUT BIPOLAR 24F LRG (ELECTROSURGICAL) IMPLANT
MANIFOLD NEPTUNE II (INSTRUMENTS) ×2 IMPLANT
PACK CYSTO (CUSTOM PROCEDURE TRAY) ×2 IMPLANT
SYR 30ML LL (SYRINGE) ×1 IMPLANT
TUBE CONNECTING 12X1/4 (SUCTIONS) ×1 IMPLANT
TUBING UROLOGY SET (TUBING) ×1 IMPLANT

## 2017-12-26 NOTE — Transfer of Care (Signed)
Immediate Anesthesia Transfer of Care Note  Patient: Matthew RinksMarty E Zettel  Procedure(s) Performed: Morton PetersHULIUM LASER TURP (TRANSURETHRAL RESECTION OF PROSTATE) (N/A Prostate)  Patient Location: PACU  Anesthesia Type:General  Level of Consciousness: awake, alert , oriented and patient cooperative  Airway & Oxygen Therapy: Patient Spontanous Breathing and Patient connected to nasal cannula oxygen  Post-op Assessment: Report given to RN and Post -op Vital signs reviewed and stable  Post vital signs: Reviewed and stable  Last Vitals:  Vitals Value Taken Time  BP 121/75 12/26/2017  1:06 PM  Temp 36.9 C 12/26/2017  1:06 PM  Pulse 61 12/26/2017  1:12 PM  Resp 8 12/26/2017  1:12 PM  SpO2 100 % 12/26/2017  1:12 PM  Vitals shown include unvalidated device data.  Last Pain:  Vitals:   12/26/17 1306  TempSrc:   PainSc: (P) 0-No pain      Patients Stated Pain Goal: 6 (12/26/17 0946)  Complications: No apparent anesthesia complications

## 2017-12-26 NOTE — Anesthesia Preprocedure Evaluation (Addendum)
Anesthesia Evaluation  Patient identified by MRN, date of birth, ID band Patient awake    Reviewed: Allergy & Precautions, H&P , NPO status , Patient's Chart, lab work & pertinent test results  Airway Mallampati: II  TM Distance: >3 FB Neck ROM: full    Dental  (+) Teeth Intact, Dental Advisory Given   Pulmonary sleep apnea ,    breath sounds clear to auscultation       Cardiovascular Exercise Tolerance: Good hypertension, Pt. on medications  Rhythm:regular Rate:Normal     Neuro/Psych    GI/Hepatic GERD  Medicated,  Endo/Other  obese  Renal/GU    BPH    Musculoskeletal   Abdominal   Peds  Hematology   Anesthesia Other Findings   Reproductive/Obstetrics                           Anesthesia Physical Anesthesia Plan  ASA: II  Anesthesia Plan: General   Post-op Pain Management:    Induction: Intravenous  PONV Risk Score and Plan: 2 and Ondansetron, Dexamethasone, Midazolam and Treatment may vary due to age or medical condition  Airway Management Planned: LMA  Additional Equipment:   Intra-op Plan:   Post-operative Plan:   Informed Consent: I have reviewed the patients History and Physical, chart, labs and discussed the procedure including the risks, benefits and alternatives for the proposed anesthesia with the patient or authorized representative who has indicated his/her understanding and acceptance.     Plan Discussed with: CRNA, Anesthesiologist and Surgeon  Anesthesia Plan Comments:         Anesthesia Quick Evaluation

## 2017-12-26 NOTE — Anesthesia Procedure Notes (Signed)
Procedure Name: LMA Insertion Date/Time: 12/26/2017 11:52 AM Performed by: Tyrone NineSauve, Cheron Coryell F, CRNA Pre-anesthesia Checklist: Patient identified, Timeout performed, Emergency Drugs available, Suction available and Patient being monitored Patient Re-evaluated:Patient Re-evaluated prior to induction Oxygen Delivery Method: Circle system utilized Preoxygenation: Pre-oxygenation with 100% oxygen Induction Type: IV induction Ventilation: Mask ventilation without difficulty LMA: LMA inserted LMA Size: 5.0 Number of attempts: 1 Placement Confirmation: CO2 detector,  positive ETCO2 and breath sounds checked- equal and bilateral Tube secured with: Tape Dental Injury: Teeth and Oropharynx as per pre-operative assessment

## 2017-12-26 NOTE — Op Note (Signed)
Preoperative diagnosis: BPH with lower urinary tract symptoms, incomplete bladder emptying Postoperative diagnosis: Same  Procedure: Thulium laser vaporization of the prostate  Surgeon: Mena GoesEskridge  Anesthesia: General  Indication for procedure: 62 year old with history of lower urinary tract symptoms and urinary retention with bilateral hydronephrosis.  His hydronephrosis improved with bladder drainage and on cystoscopy he had an obstructing median lobe of the prostate.  Findings: On cystoscopy the urethra was unremarkable, the prostate was short but an obstructing median lobe and once this was vaporized the lateral lobes fell in and were obstructed.  Large capacity bladder without stone or foreign body.  No suspicious mucosal abnormalities apart from some posterior erythema and hemorrhage from the catheter.  The trigone and the left and the right ureteral orifice were identified.  On DRE the prostate was  ~40 g and smooth without hard area or nodule.  A B&O suppository was placed.  Description of procedure: After consent was obtained patient brought to the operating room.  After adequate anesthesia was placed lithotomy position and prepped and draped in the usual sterile fashion.  A timeout was performed to confirm the patient and procedure.  The continuous flow laser sheath was advanced and the bladder inspected.  The left and the right ureteral orifice were identified.  I then made an incision at 5:00 in line with the left ureteral orifice and brought this down to the verumontanum.  A similar incision was made on the right side at 7:00.  The median lobe was then vaporized.  I then began at the bladder neck anterior to posterior and vaporized the top half of the left and then the right lateral lobes.  The prostate was short and I came down to the verumontanum and made a hockey-stick incision on the right lateral lobe and vaporized the remainder of this and a similar vaporization on the left side.  No  vaporization was done distal to the verumontanum.  Under low pressure there was good hemostasis but some residual lateral lobe tissue which was vaporized.  At another low pressure check there was a median lobe flap and some anterior tissue that was vaporized.  Finally, the remainder of lateral lobe tissue vaporized which created an excellent channel.  The ureteral orifice on the left and the right side were visualized and noted to be normal and again no vaporization done distal to the verumontanum.  There was some apical tissue remaining but I left this.  Hemostasis was excellent and the bladder filled and the scope removed.  An 418 French coud catheter was advanced in left to gravity drainage with 15 cc in the balloon.  The drainage and the irrigation was clear.  A digital rectal exam was performed and I placed a B&O suppository.  He was awakened taken to recovery room in stable condition.  Complications: None  Blood loss: Minimal  Specimens: None  Drains: 4418 French coud catheter  Disposition: Patient stable to PACU

## 2017-12-26 NOTE — Discharge Instructions (Signed)
° °Post Anesthesia Home Care Instructions ° °Activity: °Get plenty of rest for the remainder of the day. A responsible adult should stay with you for 24 hours following the procedure.  °For the next 24 hours, DO NOT: °-Drive a car °-Operate machinery °-Drink alcoholic beverages °-Take any medication unless instructed by your physician °-Make any legal decisions or sign important papers. ° °Meals: °Start with liquid foods such as gelatin or soup. Progress to regular foods as tolerated. Avoid greasy, spicy, heavy foods. If nausea and/or vomiting occur, drink only clear liquids until the nausea and/or vomiting subsides. Call your physician if vomiting continues. ° °Special Instructions/Symptoms: °Your throat may feel dry or sore from the anesthesia or the breathing tube placed in your throat during surgery. If this causes discomfort, gargle with warm salt water. The discomfort should disappear within 24 hours. ° °If you had a scopolamine patch placed behind your ear for the management of post- operative nausea and/or vomiting: ° °1. The medication in the patch is effective for 72 hours, after which it should be removed.  Wrap patch in a tissue and discard in the trash. Wash hands thoroughly with soap and water. °2. You may remove the patch earlier than 72 hours if you experience unpleasant side effects which may include dry mouth, dizziness or visual disturbances. °3. Avoid touching the patch. Wash your hands with soap and water after contact with the patch. °  ° ° °Indwelling Urinary Catheter Care, Adult °Take good care of your catheter to keep it working and to prevent problems. °How to wear your catheter °Attach your catheter to your leg with tape (adhesive tape) or a leg strap. Make sure it is not too tight. If you use tape, remove any bits of tape that are already on the catheter. °How to wear a drainage bag °You should have: °· A large overnight bag. °· A small leg bag. ° °Overnight Bag °You may wear the  overnight bag at any time. Always keep the bag below the level of your bladder but off the floor. When you sleep, put a clean plastic bag in a wastebasket. Then hang the bag inside the wastebasket. °Leg Bag °Never wear the leg bag at night. Always wear the leg bag below your knee. Keep the leg bag secure with a leg strap or tape. °How to care for your skin °· Clean the skin around the catheter at least once every day. °· Shower every day. Do not take baths. °· Put creams, lotions, or ointments on your genital area only as told by your doctor. °· Do not use powders, sprays, or lotions on your genital area. °How to clean your catheter and your skin °1. Wash your hands with soap and water. °2. Wet a washcloth in warm water and gentle (mild) soap. °3. Use the washcloth to clean the skin where the catheter enters your body. Clean downward and wipe away from the catheter in small circles. Do not wipe toward the catheter. °4. Pat the area dry with a clean towel. Make sure to clean off all soap. °How to care for your drainage bags °Empty your drainage bag when it is ?-½ full or at least 2-3 times a day. Replace your drainage bag once a month or sooner if it starts to smell bad or look dirty. Do not clean your drainage bag unless told by your doctor. °Emptying a drainage bag ° °Supplies Needed °· Rubbing alcohol. °· Gauze pad or cotton ball. °· Tape or a leg   strap. ° °Steps °1. Wash your hands with soap and water. °2. Separate (detach) the bag from your leg. °3. Hold the bag over the toilet or a clean container. Keep the bag below your hips and bladder. This stops pee (urine) from going back into the tube. °4. Open the pour spout at the bottom of the bag. °5. Empty the pee into the toilet or container. Do not let the pour spout touch any surface. °6. Put rubbing alcohol on a gauze pad or cotton ball. °7. Use the gauze pad or cotton ball to clean the pour spout. °8. Close the pour spout. °9. Attach the bag to your leg with  tape or a leg strap. °10. Wash your hands. ° °Changing a drainage bag °Supplies Needed °· Alcohol wipes. °· A clean drainage bag. °· Adhesive tape or a leg strap. ° °Steps °1. Wash your hands with soap and water. °2. Separate the dirty bag from your leg. °3. Pinch the rubber catheter with your fingers so that pee does not spill out. °4. Separate the catheter tube from the drainage tube where these tubes connect (at the connection valve). Do not let the tubes touch any surface. °5. Clean the end of the catheter tube with an alcohol wipe. Use a different alcohol wipe to clean the end of the drainage tube. °6. Connect the catheter tube to the drainage tube of the clean bag. °7. Attach the new bag to the leg with adhesive tape or a leg strap. °8. Wash your hands. ° °How to prevent infection and other problems °· Never pull on your catheter or try to remove it. Pulling can damage tissue in your body. °· Always wash your hands before and after touching your catheter. °· If a leg strap gets wet, replace it with a dry one. °· Drink enough fluids to keep your pee clear or pale yellow, or as told by your doctor. °· Do not let the drainage bag or tubing touch the floor. °· Wear cotton underwear. °· If you are male, wipe from front to back after you poop (have a bowel movement). °· Check on the catheter often to make sure it works and the tubing is not twisted. °Get help if: °· Your pee is cloudy. °· Your pee smells unusually bad. °· Your pee is not draining into the bag. °· Your tube gets clogged. °· Your catheter starts to leak. °· Your bladder feels full. °Get help right away if: °· You have redness, swelling, or pain where the catheter enters your body. °· You have fluid, pus, or a bad smell coming from the area where the catheter enters your body. °· The area where the catheter enters your body feels warm. °· You have a fever. °· You have pain in your: °? Stomach (abdomen). °? Legs. °? Lower back. °? Bladder. °· You see  blood fill the catheter. °· Your pee is pink or red. °· You feel sick to your stomach (nauseous). °· You throw up (vomit). °· You have chills. °· Your catheter gets pulled out. °This information is not intended to replace advice given to you by your health care provider. Make sure you discuss any questions you have with your health care provider. °Document Released: 07/23/2012 Document Revised: 02/24/2016 Document Reviewed: 09/10/2013 °Elsevier Interactive Patient Education © 2018 [employer] ° °

## 2017-12-26 NOTE — Interval H&P Note (Signed)
History and Physical Interval Note:  12/26/2017 11:40 AM  Matthew RinksMarty E Eastham  has presented today for surgery, with the diagnosis of BENIGN PROSTATIC HYPERPLASIA, URINARY RETENTION  The various methods of treatment have been discussed with the patient and family. After consideration of risks, benefits and other options for treatment, the patient has consented to  Procedure(s): THULIUM LASER TURP (TRANSURETHRAL RESECTION OF PROSTATE) (N/A) as a surgical intervention .  The patient's history has been reviewed, patient examined, no change in status, stable for surgery.  I have reviewed the patient's chart and labs.  Questions were answered to the patient's satisfaction.  He is well with no fever. No complaints. Urine has been clear -- yellow.    Jerilee FieldMatthew Hesper Venturella

## 2017-12-27 ENCOUNTER — Encounter (HOSPITAL_BASED_OUTPATIENT_CLINIC_OR_DEPARTMENT_OTHER): Payer: Self-pay | Admitting: Urology

## 2017-12-28 NOTE — Anesthesia Postprocedure Evaluation (Signed)
Anesthesia Post Note  Patient: Matthew [name]  Procedure(s) Performed: Morton PetersHULIUM LASER TURP (TRANSURETHRAL RESECTION OF PROSTATE) (N/A Prostate)     Patient location during evaluation: PACU Anesthesia Type: General Level of consciousness: awake and alert Pain management: pain level controlled Vital Signs Assessment: post-procedure vital signs reviewed and stable Respiratory status: spontaneous breathing, nonlabored ventilation, respiratory function stable and patient connected to nasal cannula oxygen Cardiovascular status: blood pressure returned to baseline and stable Postop Assessment: no apparent nausea or vomiting Anesthetic complications: no    Last Vitals:  Vitals:   12/26/17 1345 12/26/17 1455  BP: 109/75 108/80  Pulse: 65 (!) 58  Resp: (!) 9 14  Temp:  36.6 C  SpO2: 100% 100%    Last Pain:  Vitals:   12/26/17 1455  TempSrc: Axillary  PainSc: 0-No pain                 Ozell Ferrera S

## 2017-12-29 ENCOUNTER — Other Ambulatory Visit: Payer: Self-pay | Admitting: Family Medicine

## 2018-01-08 NOTE — Addendum Note (Signed)
Addendum  created 01/08/18 1002 by Tyrone Nine, CRNA   Intraprocedure Meds edited

## 2018-03-02 ENCOUNTER — Telehealth: Payer: Self-pay | Admitting: Family Medicine

## 2018-03-02 DIAGNOSIS — N529 Male erectile dysfunction, unspecified: Secondary | ICD-10-CM

## 2018-03-02 DIAGNOSIS — E785 Hyperlipidemia, unspecified: Secondary | ICD-10-CM

## 2018-03-02 DIAGNOSIS — I10 Essential (primary) hypertension: Secondary | ICD-10-CM

## 2018-03-05 ENCOUNTER — Other Ambulatory Visit: Payer: Self-pay | Admitting: *Deleted

## 2018-03-05 ENCOUNTER — Other Ambulatory Visit: Payer: BC Managed Care – PPO

## 2018-03-05 DIAGNOSIS — Z1211 Encounter for screening for malignant neoplasm of colon: Secondary | ICD-10-CM

## 2018-03-05 DIAGNOSIS — Z9079 Acquired absence of other genital organ(s): Secondary | ICD-10-CM

## 2018-03-05 NOTE — Addendum Note (Signed)
Addended by: Margorie JohnJOHNSON, Audi Wettstein M on: 03/05/2018 11:21 AM   Modules accepted: Orders

## 2018-03-05 NOTE — Telephone Encounter (Signed)
Pt aware lab orders placed and he also requests urine culture since he had the TURP procedure and was told by Dr Mena GoesEskridge to have culture. Urine culture ordered as well.

## 2018-03-05 NOTE — Telephone Encounter (Signed)
Lab orders have been placed

## 2018-03-06 LAB — FECAL OCCULT BLOOD, IMMUNOCHEMICAL: Fecal Occult Bld: NEGATIVE

## 2018-03-07 ENCOUNTER — Other Ambulatory Visit: Payer: Self-pay | Admitting: Family Medicine

## 2018-03-07 ENCOUNTER — Other Ambulatory Visit: Payer: BC Managed Care – PPO

## 2018-03-07 DIAGNOSIS — E785 Hyperlipidemia, unspecified: Secondary | ICD-10-CM

## 2018-03-07 DIAGNOSIS — I10 Essential (primary) hypertension: Secondary | ICD-10-CM

## 2018-03-07 DIAGNOSIS — Z9079 Acquired absence of other genital organ(s): Secondary | ICD-10-CM

## 2018-03-07 DIAGNOSIS — N529 Male erectile dysfunction, unspecified: Secondary | ICD-10-CM

## 2018-03-08 LAB — CBC WITH DIFFERENTIAL/PLATELET
Basophils Absolute: 0 10*3/uL (ref 0.0–0.2)
Basos: 1 %
EOS (ABSOLUTE): 0.2 10*3/uL (ref 0.0–0.4)
Eos: 3 %
Hematocrit: 42.5 % (ref 37.5–51.0)
Hemoglobin: 14.5 g/dL (ref 13.0–17.7)
Immature Grans (Abs): 0 10*3/uL (ref 0.0–0.1)
Immature Granulocytes: 0 %
Lymphocytes Absolute: 2.1 10*3/uL (ref 0.7–3.1)
Lymphs: 41 %
MCH: 29.2 pg (ref 26.6–33.0)
MCHC: 34.1 g/dL (ref 31.5–35.7)
MCV: 86 fL (ref 79–97)
Monocytes Absolute: 0.4 10*3/uL (ref 0.1–0.9)
Monocytes: 7 %
Neutrophils Absolute: 2.4 10*3/uL (ref 1.4–7.0)
Neutrophils: 48 %
Platelets: 230 10*3/uL (ref 150–450)
RBC: 4.97 x10E6/uL (ref 4.14–5.80)
RDW: 12.5 % (ref 12.3–15.4)
WBC: 5.1 10*3/uL (ref 3.4–10.8)

## 2018-03-08 LAB — CMP14+EGFR
ALT: 19 IU/L (ref 0–44)
AST: 20 IU/L (ref 0–40)
Albumin/Globulin Ratio: 1.8 (ref 1.2–2.2)
Albumin: 4.3 g/dL (ref 3.6–4.8)
Alkaline Phosphatase: 61 IU/L (ref 39–117)
BUN/Creatinine Ratio: 17 (ref 10–24)
BUN: 30 mg/dL — ABNORMAL HIGH (ref 8–27)
Bilirubin Total: 0.4 mg/dL (ref 0.0–1.2)
CO2: 24 mmol/L (ref 20–29)
Calcium: 9.5 mg/dL (ref 8.6–10.2)
Chloride: 105 mmol/L (ref 96–106)
Creatinine, Ser: 1.8 mg/dL — ABNORMAL HIGH (ref 0.76–1.27)
GFR calc Af Amer: 46 mL/min/{1.73_m2} — ABNORMAL LOW (ref >59)
GFR calc non Af Amer: 40 mL/min/{1.73_m2} — ABNORMAL LOW (ref >59)
Globulin, Total: 2.4 g/dL (ref 1.5–4.5)
Glucose: 103 mg/dL — ABNORMAL HIGH (ref 65–99)
Potassium: 4.9 mmol/L (ref 3.5–5.2)
Sodium: 143 mmol/L (ref 134–144)
Total Protein: 6.7 g/dL (ref 6.0–8.5)

## 2018-03-08 LAB — LIPID PANEL
Chol/HDL Ratio: 3.9 ratio (ref 0.0–5.0)
Cholesterol, Total: 150 mg/dL (ref 100–199)
HDL: 38 mg/dL — ABNORMAL LOW (ref >39)
LDL Calculated: 89 mg/dL (ref 0–99)
Triglycerides: 113 mg/dL (ref 0–149)
VLDL Cholesterol Cal: 23 mg/dL (ref 5–40)

## 2018-03-09 ENCOUNTER — Other Ambulatory Visit: Payer: Self-pay | Admitting: *Deleted

## 2018-03-09 ENCOUNTER — Encounter: Payer: Self-pay | Admitting: Family Medicine

## 2018-03-09 DIAGNOSIS — N289 Disorder of kidney and ureter, unspecified: Secondary | ICD-10-CM

## 2018-03-09 MED ORDER — CEPHALEXIN 500 MG PO CAPS: 500.0000 mg | ORAL_CAPSULE | Freq: Four times a day (QID) | ORAL | 0 refills | Status: DC

## 2018-03-09 NOTE — Telephone Encounter (Signed)
Last seen 09/22/17 

## 2018-03-09 NOTE — Progress Notes (Signed)
Spoke with patient about increasing kidney functions. He is agreeable to nephrology referral. Order placed.

## 2018-03-09 NOTE — Telephone Encounter (Signed)
I was hoping the sensitivity would come back today but it does not look like it will be today go ahead and treat him.

## 2018-03-09 NOTE — Telephone Encounter (Signed)
-----   Message from Gwenith DailyKristen N Hudy, RN sent at 03/09/2018  2:39 PM EST ----- Spoke with patient about results of other labs and he asked about the culture. Advised that the preliminary report is available and that it did grow a significant amount of gram negative bacteria. Advised that we are waiting for the final report with sensitivity. He is symptomatic with back pain and pressure and states that this feels like previous UTIs. Do you want to treat now and change or wait for sensitivity? He prefers [facility] if you do want to prescribe something now.

## 2018-03-11 LAB — URINE CULTURE

## 2018-03-12 MED ORDER — SULFAMETHOXAZOLE-TRIMETHOPRIM 800-160 MG PO TABS: 1.0000 | ORAL_TABLET | Freq: Two times a day (BID) | ORAL | 0 refills | Status: DC

## 2018-03-26 ENCOUNTER — Ambulatory Visit: Payer: BC Managed Care – PPO | Admitting: Family Medicine

## 2018-05-07 ENCOUNTER — Other Ambulatory Visit: Payer: Self-pay | Admitting: Family Medicine

## 2018-05-07 NOTE — Telephone Encounter (Signed)
This is a controlled substance, from here on outpatient needs to refill these in an office visit.

## 2018-05-08 NOTE — Telephone Encounter (Signed)
Attempted to contact patient - NVM 

## 2018-06-26 ENCOUNTER — Other Ambulatory Visit: Payer: Self-pay | Admitting: *Deleted

## 2018-06-26 DIAGNOSIS — E785 Hyperlipidemia, unspecified: Secondary | ICD-10-CM

## 2018-06-26 MED ORDER — ROSUVASTATIN CALCIUM 10 MG PO TABS: 10.0000 mg | ORAL_TABLET | Freq: Every day | ORAL | 0 refills | Status: DC

## 2018-08-10 ENCOUNTER — Other Ambulatory Visit: Payer: Self-pay | Admitting: *Deleted

## 2018-08-13 MED ORDER — SILDENAFIL CITRATE 20 MG PO TABS: ORAL_TABLET | ORAL | 5 refills | Status: DC

## 2018-08-22 ENCOUNTER — Encounter: Payer: Self-pay | Admitting: Family Medicine

## 2018-08-22 ENCOUNTER — Ambulatory Visit (INDEPENDENT_AMBULATORY_CARE_PROVIDER_SITE_OTHER): Payer: BC Managed Care – PPO | Admitting: Family Medicine

## 2018-08-22 ENCOUNTER — Other Ambulatory Visit: Payer: Self-pay

## 2018-08-22 DIAGNOSIS — J309 Allergic rhinitis, unspecified: Secondary | ICD-10-CM

## 2018-08-22 DIAGNOSIS — M546 Pain in thoracic spine: Secondary | ICD-10-CM | POA: Diagnosis not present

## 2018-08-22 MED ORDER — CYCLOBENZAPRINE HCL 10 MG PO TABS: 10.0000 mg | ORAL_TABLET | Freq: Three times a day (TID) | ORAL | 1 refills | Status: DC | PRN

## 2018-08-22 NOTE — Progress Notes (Signed)
Virtual Visit via telephone Note  I connected with Matthew [name] on 08/22/18 at 1409 by telephone and verified that I am speaking with the correct person using two identifiers. Matthew [name] is currently located at home and no other people are currently with her during visit. The provider, [name] Chantel Teti, MD is located in their office at time of visit.  Call ended at 1423  I discussed the limitations, risks, security and privacy concerns of performing an evaluation and management service by telephone and the availability of in person appointments. I also discussed with the patient that there may be a patient responsible charge related to this service. The patient expressed understanding and agreed to proceed.   History and Present Illness: Patient is calling in complaining back discomfort that is worse at night. His back is better when walking.  He says it is in the center of his midback. And he has been sitting a lot and working from home. He is doing a lot of [employer].  He denies any radiation or numbness or weakness. He has used ibuprofen and it seems to help.  He works the pain out when he moves  Patient is having nasal congestion and denies any fevers or chills or shortness of breath.  Denies any covid exposure.  He wakes up in the morning and has to clear. He is not getting relief from his flonase.    No diagnosis found.  Outpatient Encounter Medications as of 08/22/2018  Medication Sig  . benazepril (LOTENSIN) 40 MG tablet Take 1 tablet (40 mg total) by mouth daily.  . cephALEXin (KEFLEX) 500 MG capsule Take 1 capsule (500 mg total) by mouth 4 (four) times daily.  . fluticasone (FLONASE) 50 MCG/ACT nasal spray SPRAY 2 SPRAYS INTO EACH NOSTRIL EVERY DAY (Patient taking differently: as needed. SPRAY 2 SPRAYS INTO EACH NOSTRIL EVERY DAY)  . indomethacin (INDOCIN) 50 MG capsule TAKE 1 CAPSULE TWICE A DAY WITH MEALS  . polyethylene glycol (MIRALAX / GLYCOLAX) packet Take 17  g by mouth daily.  . ranitidine (ZANTAC) 150 MG capsule Take 1 capsule by mouth 2 (two) times daily.  . rosuvastatin (CRESTOR) 10 MG tablet Take 1 tablet (10 mg total) by mouth daily.  . sildenafil (REVATIO) 20 MG tablet 2-5 tabs once daily as needed for ED  . sulfamethoxazole-trimethoprim (BACTRIM DS,SEPTRA DS) 800-160 MG tablet Take 1 tablet by mouth 2 (two) times daily.  . tamsulosin (FLOMAX) 0.4 MG CAPS capsule Take 1 capsule (0.4 mg total) by mouth daily. (Patient taking differently: Take 0.4 mg by mouth every evening. )  . Testosterone 20.25 MG/ACT (1.62%) GEL APPLY 2 PUMPS TOPICALLY DAILY.   No facility-administered encounter medications on file as of 08/22/2018.     Review of Systems  Constitutional: Negative for chills and fever.  Respiratory: Negative for shortness of breath and wheezing.   Cardiovascular: Negative for chest pain and leg swelling.  Genitourinary: Negative for dysuria.  Musculoskeletal: Positive for back pain. Negative for gait problem.  Skin: Negative for rash.  All other systems reviewed and are negative.   Observations/Objective: Patient sounds comfortable and in no acute distress  Assessment and Plan: Problem List Items Addressed This Visit    None    Visit Diagnoses    Acute midline thoracic back pain    -  Primary   Relevant Medications   cyclobenzaprine (FLEXERIL) 10 MG tablet   Allergic rhinitis, unspecified seasonality, unspecified trigger  Follow Up Instructions:  As needed   I discussed the assessment and treatment plan with the patient. The patient was provided an opportunity to ask questions and all were answered. The patient agreed with the plan and demonstrated an understanding of the instructions.   The patient was advised to call back or seek an in-person evaluation if the symptoms worsen or if the condition fails to improve as anticipated.  The above assessment and management plan was discussed with the patient. The  patient verbalized understanding of and has agreed to the management plan. Patient is aware to call the clinic if symptoms persist or worsen. Patient is aware when to return to the clinic for a follow-up visit. Patient educated on when it is appropriate to go to the emergency department.    I provided 14 minutes of non-face-to-face time during this encounter.    Nils PyleJoshua A Larra Crunkleton, MD

## 2018-09-08 ENCOUNTER — Other Ambulatory Visit: Payer: Self-pay | Admitting: Family Medicine

## 2018-09-08 DIAGNOSIS — E785 Hyperlipidemia, unspecified: Secondary | ICD-10-CM

## 2018-10-04 ENCOUNTER — Other Ambulatory Visit: Payer: Self-pay | Admitting: Family Medicine

## 2018-10-04 DIAGNOSIS — E785 Hyperlipidemia, unspecified: Secondary | ICD-10-CM

## 2018-10-10 ENCOUNTER — Other Ambulatory Visit: Payer: Self-pay | Admitting: *Deleted

## 2018-10-10 DIAGNOSIS — I10 Essential (primary) hypertension: Secondary | ICD-10-CM

## 2018-10-10 MED ORDER — BENAZEPRIL HCL 40 MG PO TABS: 40.0000 mg | ORAL_TABLET | Freq: Every day | ORAL | 3 refills | Status: DC

## 2018-10-10 NOTE — Progress Notes (Unsigned)
Sent refill for the patient 

## 2018-10-11 NOTE — Progress Notes (Signed)
Left message- rx has been sent to pharmacy.  

## 2018-10-16 ENCOUNTER — Other Ambulatory Visit: Payer: Self-pay | Admitting: Family Medicine

## 2018-10-16 DIAGNOSIS — M546 Pain in thoracic spine: Secondary | ICD-10-CM

## 2018-10-16 NOTE — Telephone Encounter (Signed)
Last seen 08/22/18  Last rx'd 08/22/18 #60 with 1 refill

## 2018-10-28 ENCOUNTER — Other Ambulatory Visit: Payer: Self-pay | Admitting: Family Medicine

## 2018-10-28 DIAGNOSIS — E785 Hyperlipidemia, unspecified: Secondary | ICD-10-CM

## 2018-10-29 ENCOUNTER — Other Ambulatory Visit: Payer: Self-pay | Admitting: *Deleted

## 2018-10-29 DIAGNOSIS — I10 Essential (primary) hypertension: Secondary | ICD-10-CM

## 2018-10-29 MED ORDER — BENAZEPRIL HCL 40 MG PO TABS: 40.0000 mg | ORAL_TABLET | Freq: Every day | ORAL | 3 refills | Status: DC

## 2018-11-21 ENCOUNTER — Other Ambulatory Visit: Payer: Self-pay | Admitting: Family Medicine

## 2018-11-21 DIAGNOSIS — E785 Hyperlipidemia, unspecified: Secondary | ICD-10-CM

## 2018-11-21 NOTE — Telephone Encounter (Signed)
Patient aware.

## 2018-11-21 NOTE — Telephone Encounter (Signed)
[place]tinger. NTBS 30 days given 10/29/18

## 2018-11-22 ENCOUNTER — Encounter: Payer: Self-pay | Admitting: Family Medicine

## 2018-11-22 ENCOUNTER — Other Ambulatory Visit: Payer: Self-pay | Admitting: *Deleted

## 2018-11-22 DIAGNOSIS — E785 Hyperlipidemia, unspecified: Secondary | ICD-10-CM

## 2018-11-22 DIAGNOSIS — I10 Essential (primary) hypertension: Secondary | ICD-10-CM

## 2018-11-22 DIAGNOSIS — J309 Allergic rhinitis, unspecified: Secondary | ICD-10-CM

## 2018-11-22 DIAGNOSIS — N529 Male erectile dysfunction, unspecified: Secondary | ICD-10-CM

## 2018-11-22 DIAGNOSIS — N401 Enlarged prostate with lower urinary tract symptoms: Secondary | ICD-10-CM

## 2018-11-22 MED ORDER — ROSUVASTATIN CALCIUM 10 MG PO TABS: 10.0000 mg | ORAL_TABLET | Freq: Every day | ORAL | 0 refills | Status: DC

## 2018-11-22 MED ORDER — FLUTICASONE PROPIONATE 50 MCG/ACT NA SUSP: NASAL | 1 refills | Status: DC

## 2018-12-04 ENCOUNTER — Other Ambulatory Visit: Payer: BC Managed Care – PPO

## 2018-12-04 ENCOUNTER — Other Ambulatory Visit: Payer: Self-pay

## 2018-12-04 DIAGNOSIS — E785 Hyperlipidemia, unspecified: Secondary | ICD-10-CM

## 2018-12-04 DIAGNOSIS — R351 Nocturia: Secondary | ICD-10-CM

## 2018-12-04 DIAGNOSIS — N401 Enlarged prostate with lower urinary tract symptoms: Secondary | ICD-10-CM

## 2018-12-04 DIAGNOSIS — N529 Male erectile dysfunction, unspecified: Secondary | ICD-10-CM

## 2018-12-04 DIAGNOSIS — I10 Essential (primary) hypertension: Secondary | ICD-10-CM

## 2018-12-06 LAB — CBC WITH DIFFERENTIAL/PLATELET
Basophils Absolute: 0.1 10*3/uL (ref 0.0–0.2)
Basos: 1 %
EOS (ABSOLUTE): 0.2 10*3/uL (ref 0.0–0.4)
Eos: 2 %
Hematocrit: 50.1 % (ref 37.5–51.0)
Hemoglobin: 16.8 g/dL (ref 13.0–17.7)
Immature Grans (Abs): 0 10*3/uL (ref 0.0–0.1)
Immature Granulocytes: 0 %
Lymphocytes Absolute: 2.5 10*3/uL (ref 0.7–3.1)
Lymphs: 36 %
MCH: 29.2 pg (ref 26.6–33.0)
MCHC: 33.5 g/dL (ref 31.5–35.7)
MCV: 87 fL (ref 79–97)
Monocytes Absolute: 0.5 10*3/uL (ref 0.1–0.9)
Monocytes: 7 %
Neutrophils Absolute: 3.7 10*3/uL (ref 1.4–7.0)
Neutrophils: 54 %
Platelets: 254 10*3/uL (ref 150–450)
RBC: 5.75 x10E6/uL (ref 4.14–5.80)
RDW: 12.8 % (ref 11.6–15.4)
WBC: 6.9 10*3/uL (ref 3.4–10.8)

## 2018-12-06 LAB — CMP14+EGFR
ALT: 24 IU/L (ref 0–44)
AST: 24 IU/L (ref 0–40)
Albumin/Globulin Ratio: 2.3 — ABNORMAL HIGH (ref 1.2–2.2)
Albumin: 4.5 g/dL (ref 3.8–4.8)
Alkaline Phosphatase: 56 IU/L (ref 39–117)
BUN/Creatinine Ratio: 16 (ref 10–24)
BUN: 26 mg/dL (ref 8–27)
Bilirubin Total: 0.4 mg/dL (ref 0.0–1.2)
CO2: 23 mmol/L (ref 20–29)
Calcium: 9.3 mg/dL (ref 8.6–10.2)
Chloride: 102 mmol/L (ref 96–106)
Creatinine, Ser: 1.65 mg/dL — ABNORMAL HIGH (ref 0.76–1.27)
GFR calc Af Amer: 51 mL/min/{1.73_m2} — ABNORMAL LOW (ref >59)
GFR calc non Af Amer: 44 mL/min/{1.73_m2} — ABNORMAL LOW (ref >59)
Globulin, Total: 2 g/dL (ref 1.5–4.5)
Glucose: 93 mg/dL (ref 65–99)
Potassium: 4.5 mmol/L (ref 3.5–5.2)
Sodium: 139 mmol/L (ref 134–144)
Total Protein: 6.5 g/dL (ref 6.0–8.5)

## 2018-12-06 LAB — TESTOSTERONE,FREE AND TOTAL
Testosterone, Free: 8.9 pg/mL (ref 6.6–18.1)
Testosterone: 432 ng/dL (ref 264–916)

## 2018-12-06 LAB — LIPID PANEL
Chol/HDL Ratio: 4.8 ratio (ref 0.0–5.0)
Cholesterol, Total: 162 mg/dL (ref 100–199)
HDL: 34 mg/dL — ABNORMAL LOW (ref >39)
LDL Calculated: 56 mg/dL (ref 0–99)
Triglycerides: 362 mg/dL — ABNORMAL HIGH (ref 0–149)
VLDL Cholesterol Cal: 72 mg/dL — ABNORMAL HIGH (ref 5–40)

## 2018-12-06 LAB — PSA, TOTAL AND FREE
PSA, Free Pct: 36 %
PSA, Free: 0.18 ng/mL
Prostate Specific Ag, Serum: 0.5 ng/mL (ref 0.0–4.0)

## 2018-12-12 ENCOUNTER — Other Ambulatory Visit: Payer: Self-pay

## 2018-12-13 ENCOUNTER — Encounter: Payer: Self-pay | Admitting: Family Medicine

## 2018-12-13 ENCOUNTER — Ambulatory Visit (INDEPENDENT_AMBULATORY_CARE_PROVIDER_SITE_OTHER): Payer: BC Managed Care – PPO | Admitting: Family Medicine

## 2018-12-13 VITALS — BP 124/79 | HR 125 | Temp 98.6°F | Ht 70.0 in | Wt 235.8 lb

## 2018-12-13 DIAGNOSIS — E785 Hyperlipidemia, unspecified: Secondary | ICD-10-CM

## 2018-12-13 DIAGNOSIS — K219 Gastro-esophageal reflux disease without esophagitis: Secondary | ICD-10-CM

## 2018-12-13 DIAGNOSIS — Z0001 Encounter for general adult medical examination with abnormal findings: Secondary | ICD-10-CM

## 2018-12-13 DIAGNOSIS — Z0000 Encounter for general adult medical examination without abnormal findings: Secondary | ICD-10-CM

## 2018-12-13 DIAGNOSIS — I10 Essential (primary) hypertension: Secondary | ICD-10-CM | POA: Diagnosis not present

## 2018-12-13 DIAGNOSIS — Z23 Encounter for immunization: Secondary | ICD-10-CM

## 2018-12-13 DIAGNOSIS — K921 Melena: Secondary | ICD-10-CM

## 2018-12-13 MED ORDER — TAMSULOSIN HCL 0.4 MG PO CAPS: 0.4000 mg | ORAL_CAPSULE | Freq: Every day | ORAL | 3 refills | Status: DC

## 2018-12-13 MED ORDER — ROSUVASTATIN CALCIUM 10 MG PO TABS: 10.0000 mg | ORAL_TABLET | Freq: Every day | ORAL | 3 refills | Status: DC

## 2018-12-13 MED ORDER — INDOMETHACIN 50 MG PO CAPS: ORAL_CAPSULE | ORAL | 3 refills | Status: DC

## 2018-12-13 MED ORDER — SILDENAFIL CITRATE 20 MG PO TABS: ORAL_TABLET | ORAL | 5 refills | Status: DC

## 2018-12-13 NOTE — Progress Notes (Signed)
BP 124/79   Pulse (!) 125   Temp 98.6 F (37 C) (Temporal)   Ht 5\' 10"  (1.778 m)   Wt 235 lb 12.8 oz (107 kg)   BMI 33.83 kg/m    Subjective:   Patient ID: Matthew [name], male    DOB: 04/23/1955, 63 y.o.   MRN: [id number]  HPI: Matthew [name] is a 63 y.o. male presenting on 12/13/2018 for Annual Exam, Back Pain (phone visit 5/13- patient states it is a little better and states that it is worse at night.), and Blood In Stools (x 2 weeks)   HPI Well adult exam and physical Patient for well adult exam and physical and recheck on chronic issues.  He says is been having blood in stool occasionally over the past couple weeks.  He thinks it is related to hemorrhoids but wants to do a stool blood card just in case.  Patient has been having back pain that comes through to his upper abdomen that it sometimes feels a tightness in there but then goes through.  He says is mainly at night when he lays down.  He is on an acid reflux medication and Pepcid and does not know if it is that.  He is also been taking Flexeril and Advil which helps some.  Hypertension Patient is currently on benazepril, and their blood pressure today is 124/79. Patient denies any lightheadedness or dizziness. Patient denies headaches, blurred vision, chest pains, shortness of breath, or weakness. Denies any side effects from medication and is content with current medication.   GERD Patient is currently on famotidine Memon you wait a few weeks.  She denies any major symptoms or abdominal pain or belching or burping. She denies any blood in her stool or lightheadedness or dizziness.   Relevant past medical, surgical, family and social history reviewed and updated as indicated. Interim medical history since our last visit reviewed. Allergies and medications reviewed and updated.  Review of Systems  Constitutional: Negative for chills and fever.  Eyes: Negative for visual disturbance.  Respiratory: Negative for shortness of  breath and wheezing.   Cardiovascular: Negative for chest pain and leg swelling.  Gastrointestinal: Positive for abdominal pain. Negative for abdominal distention, constipation, diarrhea, nausea and vomiting.  Musculoskeletal: Positive for back pain. Negative for gait problem.  Skin: Negative for rash.  Neurological: Negative for dizziness, weakness and light-headedness.  All other systems reviewed and are negative.   Per HPI unless specifically indicated above   Allergies as of 12/13/2018   No Known Allergies     Medication List       Accurate as of December 13, 2018 10:57 AM. If you have any questions, ask your nurse or doctor.        benazepril 40 MG tablet Commonly known as: LOTENSIN Take 1 tablet (40 mg total) by mouth daily.   cyclobenzaprine 10 MG tablet Commonly known as: FLEXERIL TAKE 1 TABLET BY MOUTH THREE TIMES A DAY AS NEEDED FOR MUSCLE SPASMS   famotidine 20 MG tablet Commonly known as: PEPCID Take 20 mg by mouth 2 (two) times daily.   fluticasone 50 MCG/ACT nasal spray Commonly known as: FLONASE SPRAY 2 SPRAYS INTO EACH NOSTRIL EVERY DAY   indomethacin 50 MG capsule Commonly known as: INDOCIN TAKE 1 CAPSULE TWICE A DAY WITH MEALS   polyethylene glycol 17 g packet Commonly known as: MIRALAX / GLYCOLAX Take 17 g by mouth daily.   rosuvastatin 10 MG tablet Commonly known as: CRESTOR  Take 1 tablet (10 mg total) by mouth daily.   sildenafil 20 MG tablet Commonly known as: Revatio 2-5 tabs once daily as needed for ED   tamsulosin 0.4 MG Caps capsule Commonly known as: FLOMAX Take 1 capsule (0.4 mg total) by mouth daily. What changed: when to take this   Testosterone 20.25 MG/ACT (1.62%) Gel APPLY 2 PUMPS TOPICALLY DAILY.        Objective:   BP 124/79   Pulse (!) 125   Temp 98.6 F (37 C) (Temporal)   Ht 5\' 10"  (1.778 m)   Wt 235 lb 12.8 oz (107 kg)   BMI 33.83 kg/m   Wt Readings from Last 3 Encounters:  12/13/18 235 lb 12.8 oz (107  kg)  12/26/17 213 lb 6.4 oz (96.8 kg)  09/22/17 215 lb (97.5 kg)    Physical Exam Vitals signs and nursing note reviewed.  Constitutional:      General: He is not in acute distress.    Appearance: He is well-developed. He is not diaphoretic.  Eyes:     General: No scleral icterus.    Conjunctiva/sclera: Conjunctivae normal.  Neck:     Musculoskeletal: Neck supple.     Thyroid: No thyromegaly.  Cardiovascular:     Rate and Rhythm: Normal rate and regular rhythm.     Heart sounds: Normal heart sounds. No murmur.  Pulmonary:     Effort: Pulmonary effort is normal. No respiratory distress.     Breath sounds: Normal breath sounds. No wheezing.  Musculoskeletal: Normal range of motion.  Lymphadenopathy:     Cervical: No cervical adenopathy.  Skin:    General: Skin is warm and dry.     Findings: No rash.  Neurological:     Mental Status: He is alert and oriented to person, place, and time.     Coordination: Coordination normal.  Psychiatric:        Behavior: Behavior normal.       Assessment & Plan:   Problem List Items Addressed This Visit      Cardiovascular and Mediastinum   Hypertension - Primary   Relevant Medications   rosuvastatin (CRESTOR) 10 MG tablet   sildenafil (REVATIO) 20 MG tablet     Other   Hyperlipidemia with target LDL less than 100   Relevant Medications   rosuvastatin (CRESTOR) 10 MG tablet   sildenafil (REVATIO) 20 MG tablet    Other Visit Diagnoses    Need for immunization against influenza       Relevant Orders   Flu Vaccine QUAD 36+ mos IM (Completed)   Blood in stool       Gastroesophageal reflux disease without esophagitis          The back pain seems like it could be related to GERD, avoid too much Advil, will add omeprazole to see if it could help and continue Pepcid. Follow up plan: Return in about 6 months (around 06/12/2019), or if symptoms worsen or fail to improve.  Counseling provided for all of the vaccine components Orders  Placed This Encounter  Procedures  . Flu Vaccine QUAD 36+ mos IM    [name], MD [facility] 12/13/2018, 10:57 AM

## 2018-12-14 ENCOUNTER — Encounter: Payer: Self-pay | Admitting: Family Medicine

## 2018-12-14 ENCOUNTER — Other Ambulatory Visit: Payer: Self-pay

## 2018-12-14 ENCOUNTER — Other Ambulatory Visit: Payer: BC Managed Care – PPO

## 2018-12-14 DIAGNOSIS — Z1211 Encounter for screening for malignant neoplasm of colon: Secondary | ICD-10-CM

## 2018-12-14 MED ORDER — OMEPRAZOLE 20 MG PO CPDR: 20.0000 mg | DELAYED_RELEASE_CAPSULE | Freq: Every day | ORAL | 5 refills | Status: DC

## 2018-12-14 NOTE — Addendum Note (Signed)
Addended by: Liliane Bade on: 12/14/2018 10:57 AM   Modules accepted: Orders

## 2018-12-15 LAB — FECAL OCCULT BLOOD, IMMUNOCHEMICAL: Fecal Occult Bld: NEGATIVE

## 2019-01-23 ENCOUNTER — Encounter: Payer: Self-pay | Admitting: Family Medicine

## 2019-01-23 DIAGNOSIS — N289 Disorder of kidney and ureter, unspecified: Secondary | ICD-10-CM

## 2019-02-21 ENCOUNTER — Encounter: Payer: Self-pay | Admitting: Family Medicine

## 2019-02-21 ENCOUNTER — Telehealth: Payer: Self-pay | Admitting: Family Medicine

## 2019-02-21 NOTE — Telephone Encounter (Signed)
Apt scheduled.  

## 2019-02-22 ENCOUNTER — Telehealth: Payer: Self-pay | Admitting: Family Medicine

## 2019-02-22 ENCOUNTER — Other Ambulatory Visit: Payer: Self-pay

## 2019-02-25 ENCOUNTER — Ambulatory Visit (INDEPENDENT_AMBULATORY_CARE_PROVIDER_SITE_OTHER): Payer: BC Managed Care – PPO | Admitting: Family Medicine

## 2019-02-25 ENCOUNTER — Ambulatory Visit (INDEPENDENT_AMBULATORY_CARE_PROVIDER_SITE_OTHER): Payer: BC Managed Care – PPO

## 2019-02-25 ENCOUNTER — Encounter: Payer: Self-pay | Admitting: Family Medicine

## 2019-02-25 VITALS — BP 128/80 | HR 89 | Temp 97.1°F | Ht 70.0 in | Wt 224.4 lb

## 2019-02-25 DIAGNOSIS — M546 Pain in thoracic spine: Secondary | ICD-10-CM | POA: Diagnosis not present

## 2019-02-25 DIAGNOSIS — G8929 Other chronic pain: Secondary | ICD-10-CM

## 2019-02-25 MED ORDER — METHYLPREDNISOLONE ACETATE 80 MG/ML IJ SUSP
80.0000 mg | Freq: Once | INTRAMUSCULAR | Status: AC
  Administered 2019-02-25: 80 mg via INTRAMUSCULAR

## 2019-02-25 MED ORDER — OMEPRAZOLE 20 MG PO CPDR: 20.0000 mg | DELAYED_RELEASE_CAPSULE | Freq: Two times a day (BID) | ORAL | 3 refills | Status: DC

## 2019-02-25 NOTE — Progress Notes (Signed)
BP 128/80   Pulse 89   Temp (!) 97.1 F (36.2 C) (Temporal)   Ht 5\' 10"  (1.778 m)   Wt 224 lb 6.4 oz (101.8 kg)   SpO2 100%   BMI 32.20 kg/m    Subjective:   Patient ID: Matthew [name], male    DOB: [birth year], 63 y.o.   MRN: [id number]  HPI: Matthew [name] is a 63 y.o. male presenting on 02/25/2019 for Back Pain (Patient states that it has been going on since March and still hurts at night)   HPI Patient is coming in with continued back pain that still been bothering him since about the past 8 months.  He was seen in May and again in July for this.  There was some concerns that might be related to his uncontrolled acid reflux.  He says the pain does mostly hurt him at night and does have a lot of belching and burping and indigestion.  He was taking famotidine it was not working and he switched omeprazole a still not eating complete relief and needed we will double the omeprazole.  He says his pain is mostly between the shoulder blades in his back and does not radiate anywhere else.  He says it is mild but more of a nagging feeling and he just wants to make sure that nothing else is going on there.  He denies any numbness or weakness.  He rates the pain is mild but just nagging and is happening almost every night and sometimes wakes him up at night.  Relevant past medical, surgical, family and social history reviewed and updated as indicated. Interim medical history since our last visit reviewed. Allergies and medications reviewed and updated.  Review of Systems  Constitutional: Negative for chills and fever.  Respiratory: Negative for shortness of breath and wheezing.   Cardiovascular: Negative for chest pain and leg swelling.  Gastrointestinal: Positive for abdominal pain and nausea.  Musculoskeletal: Positive for back pain. Negative for gait problem.  Skin: Negative for rash.  All other systems reviewed and are negative.   Per HPI unless specifically indicated above     Objective:   BP 128/80   Pulse 89   Temp (!) 97.1 F (36.2 C) (Temporal)   Ht 5\' 10"  (1.778 m)   Wt 224 lb 6.4 oz (101.8 kg)   SpO2 100%   BMI 32.20 kg/m   Wt Readings from Last 3 Encounters:  02/25/19 224 lb 6.4 oz (101.8 kg)  12/13/18 235 lb 12.8 oz (107 kg)  12/26/17 213 lb 6.4 oz (96.8 kg)    Physical Exam Vitals signs and nursing note reviewed.  Constitutional:      General: He is not in acute distress.    Appearance: He is well-developed. He is not diaphoretic.  Eyes:     General: No scleral icterus.    Conjunctiva/sclera: Conjunctivae normal.  Neck:     Thyroid: No thyromegaly.  Musculoskeletal: Normal range of motion.        General: No tenderness (No tenderness to palpation on exam).  Skin:    General: Skin is warm and dry.     Findings: No rash.  Neurological:     Mental Status: He is alert and oriented to person, place, and time.     Coordination: Coordination normal.  Psychiatric:        Behavior: Behavior normal.     Thoracic Xr: Scoliosis, mild degenerative changes but no major acute bony abnormalities.  We will  wait radiology for final read  Assessment & Plan:   Problem List Items Addressed This Visit    None    Visit Diagnoses    Chronic midline thoracic back pain    -  Primary   Relevant Medications   methylPREDNISolone acetate (DEPO-MEDROL) injection 80 mg (Start on 02/25/2019 12:45 PM)   Other Relevant Orders   DG Thoracic Spine 2 View      Will double up on Prilosec because of still uncontrolled indigestion. Follow up plan: Return if symptoms worsen or fail to improve.  Counseling provided for all of the vaccine components No orders of the defined types were placed in this encounter.   Arville Care, MD [facility] 02/25/2019, 12:00 PM

## 2019-03-05 ENCOUNTER — Encounter: Payer: Self-pay | Admitting: Family Medicine

## 2019-03-28 ENCOUNTER — Other Ambulatory Visit: Payer: Self-pay | Admitting: Family Medicine

## 2019-06-12 ENCOUNTER — Ambulatory Visit: Payer: BC Managed Care – PPO | Admitting: Family Medicine

## 2019-07-04 ENCOUNTER — Encounter: Payer: Self-pay | Admitting: Family Medicine

## 2019-07-05 ENCOUNTER — Ambulatory Visit: Payer: BC Managed Care – PPO | Admitting: Family Medicine

## 2019-07-05 ENCOUNTER — Other Ambulatory Visit: Payer: Self-pay

## 2019-07-05 ENCOUNTER — Encounter: Payer: Self-pay | Admitting: Family Medicine

## 2019-07-05 VITALS — BP 127/85 | HR 88 | Temp 99.3°F | Ht 70.0 in | Wt 220.2 lb

## 2019-07-05 DIAGNOSIS — M1A9XX0 Chronic gout, unspecified, without tophus (tophi): Secondary | ICD-10-CM | POA: Diagnosis not present

## 2019-07-05 DIAGNOSIS — E785 Hyperlipidemia, unspecified: Secondary | ICD-10-CM | POA: Diagnosis not present

## 2019-07-05 DIAGNOSIS — N289 Disorder of kidney and ureter, unspecified: Secondary | ICD-10-CM

## 2019-07-05 DIAGNOSIS — I10 Essential (primary) hypertension: Secondary | ICD-10-CM

## 2019-07-05 DIAGNOSIS — I208 Other forms of angina pectoris: Secondary | ICD-10-CM

## 2019-07-05 MED ORDER — SILDENAFIL CITRATE 20 MG PO TABS: ORAL_TABLET | ORAL | 5 refills | Status: DC

## 2019-07-05 NOTE — Progress Notes (Signed)
BP 127/85   Pulse 88   Temp 99.3 F (37.4 C)   Ht 5\' 10"  (1.778 m)   Wt 220 lb 4 oz (99.9 kg)   SpO2 96%   BMI 31.60 kg/m    Subjective:   Patient ID: Matthew [name], male    DOB: 09-Mar-1956, 64 y.o.   MRN: [id number]  HPI: Matthew [name] is a 64 y.o. male presenting on 07/05/2019 for Medical Management of Chronic Issues, Chronic Kidney Disease, Hypertension, and Hyperlipidemia   HPI Hypertension Patient is currently on benazepril, and their blood pressure today is 127/85. Patient denies any lightheadedness or dizziness. Patient denies headaches, blurred vision, chest pains, shortness of breath, or weakness. Denies any side effects from medication and is content with current medication.  Patient has been having shortness of breath and some chest tightness that occurs when he is sleeping sometimes it during the day sometimes when he is walking on exertion.  This visibly has been fighting for some time.  Hyperlipidemia Patient is coming in for recheck of his hyperlipidemia. The patient is currently taking Crestor. They deny any issues with myalgias or history of liver damage from it. They deny any focal numbness or weakness or chest pain.   Patient has CKD and seen Dr. 07/07/2019.  Relevant past medical, surgical, family and social history reviewed and updated as indicated. Interim medical history since our last visit reviewed. Allergies and medications reviewed and updated.  Review of Systems  Constitutional: Negative for chills and fever.  Respiratory: Negative for shortness of breath and wheezing.   Cardiovascular: Negative for chest pain and leg swelling.  Musculoskeletal: Negative for back pain and gait problem.  Skin: Negative for rash.  Neurological: Negative for dizziness, weakness and light-headedness.  All other systems reviewed and are negative.   Per HPI unless specifically indicated above   Allergies as of 07/05/2019   No Known Allergies     Medication List      Accurate as of July 05, 2019  2:19 PM. If you have any questions, ask your nurse or doctor.        STOP taking these medications   famotidine 20 MG tablet Commonly known as: PEPCID Stopped by: July 07, 2019 Dana Dorner, MD     TAKE these medications   benazepril 40 MG tablet Commonly known as: LOTENSIN Take 1 tablet (40 mg total) by mouth daily.   cyclobenzaprine 10 MG tablet Commonly known as: FLEXERIL TAKE 1 TABLET BY MOUTH THREE TIMES A DAY AS NEEDED FOR MUSCLE SPASMS   fluticasone 50 MCG/ACT nasal spray Commonly known as: FLONASE SPRAY 2 SPRAYS INTO EACH NOSTRIL EVERY DAY   indomethacin 50 MG capsule Commonly known as: INDOCIN TAKE 1 CAPSULE TWICE A DAY WITH MEALS   omeprazole 20 MG capsule Commonly known as: PRILOSEC Take 1 capsule (20 mg total) by mouth 2 (two) times daily before a meal.   polyethylene glycol 17 g packet Commonly known as: MIRALAX / GLYCOLAX Take 17 g by mouth daily.   rosuvastatin 10 MG tablet Commonly known as: CRESTOR Take 1 tablet (10 mg total) by mouth daily.   sildenafil 20 MG tablet Commonly known as: Revatio 2-5 tabs once daily as needed for ED   tamsulosin 0.4 MG Caps capsule Commonly known as: FLOMAX Take 1 capsule (0.4 mg total) by mouth daily.   Testosterone 20.25 MG/ACT (1.62%) Gel APPLY 2 PUMPS TOPICALLY DAILY.   testosterone cypionate 100 MG/ML injection Commonly known as: DEPOTESTOTERONE CYPIONATE Inject into the  muscle every 14 (fourteen) days. For IM use only        Objective:   BP 127/85   Pulse 88   Temp 99.3 F (37.4 C)   Ht 5\' 10"  (1.778 m)   Wt 220 lb 4 oz (99.9 kg)   SpO2 96%   BMI 31.60 kg/m   Wt Readings from Last 3 Encounters:  07/05/19 220 lb 4 oz (99.9 kg)  02/25/19 224 lb 6.4 oz (101.8 kg)  12/13/18 235 lb 12.8 oz (107 kg)    Physical Exam Vitals and nursing note reviewed.  Constitutional:      General: He is not in acute distress.    Appearance: He is well-developed. He is not  diaphoretic.  Eyes:     General: No scleral icterus.    Conjunctiva/sclera: Conjunctivae normal.  Neck:     Thyroid: No thyromegaly.  Cardiovascular:     Rate and Rhythm: Normal rate and regular rhythm.     Heart sounds: Normal heart sounds. No murmur.  Pulmonary:     Effort: Pulmonary effort is normal. No respiratory distress.     Breath sounds: Normal breath sounds. No wheezing.  Musculoskeletal:     Cervical back: Neck supple.  Lymphadenopathy:     Cervical: No cervical adenopathy.  Skin:    General: Skin is warm and dry.     Findings: No rash.  Neurological:     Mental Status: He is alert and oriented to person, place, and time.     Coordination: Coordination normal.  Psychiatric:        Behavior: Behavior normal.       Assessment & Plan:   Problem List Items Addressed This Visit      Cardiovascular and Mediastinum   Hypertension - Primary   Relevant Medications   sildenafil (REVATIO) 20 MG tablet     Other   Gout   Hyperlipidemia with target LDL less than 100   Relevant Medications   sildenafil (REVATIO) 20 MG tablet    Other Visit Diagnoses    Kidney function abnormal       Atypical angina (HCC)       Relevant Medications   sildenafil (REVATIO) 20 MG tablet   Other Relevant Orders   Ambulatory referral to Cardiology    Patient had extensive panel with kidney doctor on January 2 months ago, will hold off on blood work till next time.  Will refer to cardiology because of exertional dyspnea.  Follow up plan: Return in about 6 months (around 01/05/2020), or if symptoms worsen or fail to improve, for Hypertension and cholesterol.  Counseling provided for all of the vaccine components Orders Placed This Encounter  Procedures  . Ambulatory referral to Cardiology    [name], MD [facility] 07/05/2019, 2:19 PM

## 2019-07-17 ENCOUNTER — Ambulatory Visit: Payer: BC Managed Care – PPO | Admitting: Family Medicine

## 2019-08-13 ENCOUNTER — Encounter: Payer: Self-pay | Admitting: Cardiology

## 2019-08-13 DIAGNOSIS — Z7189 Other specified counseling: Secondary | ICD-10-CM | POA: Insufficient documentation

## 2019-08-13 DIAGNOSIS — R072 Precordial pain: Secondary | ICD-10-CM | POA: Insufficient documentation

## 2019-08-13 NOTE — Progress Notes (Signed)
Cardiology Office Note   Date:  08/14/2019   ID:  Matthew [name], DOB [birth year], MRN [id number]  PCP:  Dettinger, [name], MD  Cardiologist:   No primary care provider on file. Referring:  Dettinger, [name], MD  Chief Complaint  Patient presents with  . Back Pain      History of Present Illness: Matthew [name] is a 64 y.o. male who is referred by Dettinger, [name], MD presents for back pain.  He has had no prior cardiac history although he has a strong family history of early coronary artery disease.  He has been having back pain for about a year.  This sometimes radiates through to his upper epigastric or lower chest area.  He thought it was acid but he does not think it feels like reflux he has had previously.  It only happens at night.  Its not when he first goes to bed.  He has to move around in the bed to try to get comfortable and he does not think he is sleeping well.  Its moderate in intensity.  It goes away when he gets up.  He has tried omeprazole and switching from ranitidine to that without improvement.  He previously had work-up.  To include colonoscopy and EGD but this was a while ago.  He had a chest x-ray that was unremarkable.  He does walk 2 to 3 miles a day but he does not describe symptoms with this and in fact feels good when he walks.  When he does get the discomfort is not having any shortness of breath, PND or orthopnea.  Is not describing palpitations, presyncope or syncope.  He said he does not have food intolerances.  He said no change in bowel.  I did note that he has had a high triglyceride.  He has renal insufficiency and has had an extensive blood work history but I was able to review on Care Everywhere..   Past Medical History:  Diagnosis Date  . Allergic rhinitis   . BPH (benign prostatic hyperplasia)   . Chronic constipation   . ED (erectile dysfunction)   . GERD (gastroesophageal reflux disease)   . Gout   . Heme positive stool    history of,  normal colonoscopy  . History of sleep apnea    corrected after T&A procedure  . Hyperlipidemia   . Hypertension   . Microscopic hematuria   . Nocturia   . Urinary catheter in place    2018 x one month    Past Surgical History:  Procedure Laterality Date  . COLONOSCOPY  06/2017  . laser vaporization of prostate    . SHOULDER SURGERY Right   . THULIUM LASER TURP (TRANSURETHRAL RESECTION OF PROSTATE) N/A 12/26/2017   Procedure: THULIUM LASER TURP (TRANSURETHRAL RESECTION OF PROSTATE);  Surgeon: Jerilee Field, MD;  Location: [facility];  Service: Urology;  Laterality: N/A;  . TONSILLECTOMY AND ADENOIDECTOMY  2009  . UPPER GI ENDOSCOPY       Current Outpatient Medications  Medication Sig Dispense Refill  . benazepril (LOTENSIN) 40 MG tablet Take 1 tablet (40 mg total) by mouth daily. 90 tablet 3  . cyclobenzaprine (FLEXERIL) 10 MG tablet TAKE 1 TABLET BY MOUTH THREE TIMES A DAY AS NEEDED FOR MUSCLE SPASMS 60 tablet 1  . fluticasone (FLONASE) 50 MCG/ACT nasal spray SPRAY 2 SPRAYS INTO EACH NOSTRIL EVERY DAY 48 g 1  . indomethacin (INDOCIN) 50 MG capsule TAKE 1 CAPSULE  TWICE A DAY WITH MEALS 180 capsule 3  . omeprazole (PRILOSEC) 20 MG capsule Take 1 capsule (20 mg total) by mouth 2 (two) times daily before a meal. 180 capsule 3  . polyethylene glycol (MIRALAX / GLYCOLAX) packet Take 17 g by mouth daily.    . rosuvastatin (CRESTOR) 10 MG tablet Take 1 tablet (10 mg total) by mouth daily. 90 tablet 3  . sildenafil (REVATIO) 20 MG tablet 2-5 tabs once daily as needed for ED 50 tablet 5  . tamsulosin (FLOMAX) 0.4 MG CAPS capsule Take 1 capsule (0.4 mg total) by mouth daily. 90 capsule 3  . Testosterone 20.25 MG/ACT (1.62%) GEL APPLY 2 PUMPS TOPICALLY DAILY. 75 g 1  . testosterone cypionate (DEPOTESTOTERONE CYPIONATE) 100 MG/ML injection Inject into the muscle every 14 (fourteen) days. For IM use only     No current facility-administered medications for this visit.     Allergies:   Patient has no known allergies.    Social History:  The patient  reports that he has never smoked. He has never used smokeless tobacco. He reports current alcohol use. He reports that he does not use drugs.   Family History:  The patient's family history includes Cancer in his mother; Heart attack (age of onset: 44) in his father; Prostate cancer in his paternal uncle.    ROS:  Please see the history of present illness.   Otherwise, review of systems are positive for none.   All other systems are reviewed and negative.    PHYSICAL EXAM: VS:  BP 140/90   Pulse 78   Ht 5\' 10"  (1.778 m)   Wt 221 lb (100.2 kg)   BMI 31.71 kg/m  , BMI Body mass index is 31.71 kg/m. GENERAL:  Well appearing HEENT:  Pupils equal round and reactive, fundi not visualized, oral mucosa unremarkable NECK:  No jugular venous distention, waveform within normal limits, carotid upstroke brisk and symmetric, no bruits, no thyromegaly LYMPHATICS:  No cervical, inguinal adenopathy LUNGS:  Clear to auscultation bilaterally BACK:  No CVA tenderness CHEST:  Unremarkable HEART:  PMI not displaced or sustained,S1 and S2 within normal limits, no S3, no S4, no clicks, no rubs, no murmurs ABD:  Flat, positive bowel sounds normal in frequency in pitch, no bruits, no rebound, no guarding, no midline pulsatile mass, no hepatomegaly, no splenomegaly EXT:  2 plus pulses throughout, no edema, no cyanosis no clubbing SKIN:  No rashes no nodules NEURO:  Cranial nerves II through XII grossly intact, motor grossly intact throughout PSYCH:  Cognitively intact, oriented to person place and time    EKG:  EKG is ordered today. The ekg ordered today demonstrates sinus rhythm,    Recent Labs: 12/04/2018: ALT 24; BUN 26; Creatinine, Ser 1.65; Hemoglobin 16.8; Platelets 254; Potassium 4.5; Sodium 139    Lipid Panel    Component Value Date/Time   CHOL 162 12/04/2018 0802   CHOL 151 02/19/2013 0933   TRIG 362 (H)  12/04/2018 0802   TRIG 149 07/08/2014 0859   TRIG 195 (H) 02/19/2013 0933   HDL 34 (L) 12/04/2018 0802   HDL 42 07/08/2014 0859   HDL 43 02/19/2013 0933   CHOLHDL 4.8 12/04/2018 0802   LDLCALC 56 12/04/2018 0802   LDLCALC 69 02/19/2013 0933      Wt Readings from Last 3 Encounters:  08/14/19 221 lb (100.2 kg)  07/05/19 220 lb 4 oz (99.9 kg)  02/25/19 224 lb 6.4 oz (101.8 kg)  Other studies Reviewed: Additional studies/ records that were reviewed today include: Labs. Review of the above records demonstrates:  Please see elsewhere in the note.     ASSESSMENT AND PLAN:  Chest pain:   Chest pain is atypical.  However, he has significant cardiovascular risk factors.  I would like to check a coronary calcium score. I will bring the patient back for a POET (Plain Old Exercise Test). This will allow me to screen for obstructive coronary disease, risk stratify and very importantly provide a prescription for exercise.  HTN: His blood pressure is controlled although it is upper limits of normal.  He says it is usually better than this.  No change in therapy.  Dyslipidemia: Triglycerides were 362 most recently and I like to repeat this.  Covid education: He has had his vaccines.  Current medicines are reviewed at length with the patient today.  The patient does not have concerns regarding medicines.  The following changes have been made:  no change  Labs/ tests ordered today include:   Orders Placed This Encounter  Procedures  . CT CARDIAC SCORING  . Lipid panel  . EXERCISE TOLERANCE TEST (ETT)  . EKG 12-Lead     Disposition:   FU with me as needed based on the results of the above   Signed, Rollene Rotunda, MD  08/14/2019 2:23 PM    [facility]

## 2019-08-14 ENCOUNTER — Other Ambulatory Visit: Payer: Self-pay

## 2019-08-14 ENCOUNTER — Ambulatory Visit (INDEPENDENT_AMBULATORY_CARE_PROVIDER_SITE_OTHER): Payer: BC Managed Care – PPO | Admitting: Cardiology

## 2019-08-14 ENCOUNTER — Encounter: Payer: Self-pay | Admitting: Cardiology

## 2019-08-14 VITALS — BP 140/90 | HR 78 | Ht 70.0 in | Wt 221.0 lb

## 2019-08-14 DIAGNOSIS — Z7189 Other specified counseling: Secondary | ICD-10-CM

## 2019-08-14 DIAGNOSIS — I10 Essential (primary) hypertension: Secondary | ICD-10-CM

## 2019-08-14 DIAGNOSIS — R072 Precordial pain: Secondary | ICD-10-CM

## 2019-08-14 DIAGNOSIS — E785 Hyperlipidemia, unspecified: Secondary | ICD-10-CM

## 2019-08-14 NOTE — Patient Instructions (Addendum)
Medication Instructions:  The current medical regimen is effective;  continue present plan and medications.  *If you need a refill on your cardiac medications before your next appointment, please call your pharmacy*   Lab Work: Please have fasting Lipid panel  If you have labs (blood work) drawn today and your tests are completely normal, you will receive your results only by: Marland Kitchen MyChart Message (if you have MyChart) OR . A paper copy in the mail If you have any lab test that is abnormal or we need to change your treatment, we will call you to review the results.   Testing/Procedures: Your physician has requested that you have Coronary Calcium score which is completed by CT. Cardiac computed tomography (CT) is a painless test that uses an x-ray machine to take clear, detailed pictures of your heart. There are no prior instructions for this testing.   Your physician has requested that you have an exercise tolerance test. For further information please visit [email or web address] Please also follow instruction sheet, as given.  You will need Covid testing before this test. (2 days before - then self quarantine until the day of tests).   Follow-Up: At [facility], you and your health needs are our priority.  As part of our continuing mission to provide you with exceptional heart care, we have created designated Provider Care Teams.  These Care Teams include your primary Cardiologist (physician) and Advanced Practice Providers (APPs -  Physician Assistants and Nurse Practitioners) who all work together to provide you with the care you need, when you need it.  We recommend signing up for the patient portal called "MyChart".  Sign up information is provided on this After Visit Summary.  MyChart is used to connect with patients for Virtual Visits (Telemedicine).  Patients are able to view lab/test results, encounter notes, upcoming appointments, etc.  Non-urgent messages can be sent to your  provider as well.   To learn more about what you can do with MyChart, go to [email or web address].    Follow up as needed after the above testing.    Thank you for choosing [facility]!!

## 2019-09-15 ENCOUNTER — Other Ambulatory Visit: Payer: Self-pay | Admitting: Family Medicine

## 2019-09-15 DIAGNOSIS — I10 Essential (primary) hypertension: Secondary | ICD-10-CM

## 2019-09-20 ENCOUNTER — Other Ambulatory Visit (HOSPITAL_COMMUNITY)
Admission: RE | Admit: 2019-09-20 | Discharge: 2019-09-20 | Disposition: A | Discharge status: Home / Self Care | Payer: BC Managed Care – PPO | Source: Ambulatory Visit | Attending: Cardiology | Admitting: Cardiology

## 2019-09-20 ENCOUNTER — Other Ambulatory Visit: Payer: Self-pay

## 2019-09-20 DIAGNOSIS — Z20822 Contact with and (suspected) exposure to covid-19: Secondary | ICD-10-CM | POA: Diagnosis not present

## 2019-09-20 DIAGNOSIS — Z01812 Encounter for preprocedural laboratory examination: Secondary | ICD-10-CM | POA: Diagnosis not present

## 2019-09-21 LAB — SARS CORONAVIRUS 2 (TAT 6-24 HRS): SARS Coronavirus 2: NEGATIVE

## 2019-09-24 ENCOUNTER — Ambulatory Visit (INDEPENDENT_AMBULATORY_CARE_PROVIDER_SITE_OTHER)
Admission: RE | Admit: 2019-09-24 | Discharge: 2019-09-24 | Disposition: A | Discharge status: Home / Self Care | Payer: Self-pay | Source: Ambulatory Visit | Attending: Cardiology | Admitting: Cardiology

## 2019-09-24 ENCOUNTER — Other Ambulatory Visit: Payer: BC Managed Care – PPO

## 2019-09-24 ENCOUNTER — Other Ambulatory Visit: Payer: Self-pay

## 2019-09-24 ENCOUNTER — Ambulatory Visit (INDEPENDENT_AMBULATORY_CARE_PROVIDER_SITE_OTHER): Payer: BC Managed Care – PPO

## 2019-09-24 DIAGNOSIS — I10 Essential (primary) hypertension: Secondary | ICD-10-CM | POA: Diagnosis not present

## 2019-09-24 DIAGNOSIS — E785 Hyperlipidemia, unspecified: Secondary | ICD-10-CM

## 2019-09-24 DIAGNOSIS — R072 Precordial pain: Secondary | ICD-10-CM | POA: Diagnosis not present

## 2019-09-24 LAB — EXERCISE TOLERANCE TEST
Estimated workload: 10.1 METS
Exercise duration (min): 8 min
Exercise duration (sec): 0 s
MPHR: 158 {beats}/min
Peak HR: 157 {beats}/min
Percent HR: 99 %
RPE: 15
Rest HR: 80 {beats}/min

## 2019-09-24 LAB — LIPID PANEL
Chol/HDL Ratio: 4.7 ratio (ref 0.0–5.0)
Cholesterol, Total: 166 mg/dL (ref 100–199)
HDL: 35 mg/dL — ABNORMAL LOW (ref >39)
LDL Chol Calc (NIH): 99 mg/dL (ref 0–99)
Triglycerides: 182 mg/dL — ABNORMAL HIGH (ref 0–149)
VLDL Cholesterol Cal: 32 mg/dL (ref 5–40)

## 2019-10-03 ENCOUNTER — Telehealth: Payer: Self-pay | Admitting: *Deleted

## 2019-10-03 ENCOUNTER — Telehealth: Payer: Self-pay | Admitting: Cardiology

## 2019-10-03 MED ORDER — ROSUVASTATIN CALCIUM 20 MG PO TABS
20.0000 mg | ORAL_TABLET | Freq: Every day | ORAL | 3 refills | Status: DC
Start: 2019-10-03 — End: 2019-11-13

## 2019-10-03 NOTE — Telephone Encounter (Signed)
I spoke with patient and reviewed lab results/recommendations with him. He is seeing Dr Antoine Poche on 11/13/19. I told patient arrangements for follow up lab work could be made at that appointment.  Will send new prescription to [facility] in [facility]

## 2019-10-03 NOTE — Telephone Encounter (Signed)
Rollene Rotunda, MD  09/28/2019 8:25 PM EDT     He is not at target with his lipids. Please increase the Crestor to 20 mg po daily. Repeat a BMET in 8 months. Call Mr. Andres with the results and send results to Dettinger, [name], MD   I placed call to patient and left message to call office

## 2019-10-03 NOTE — Telephone Encounter (Signed)
      I went in pt's chart to see who called him today. It was Charlotte Crumb, I sent the call to her and she took the call.

## 2019-11-12 DIAGNOSIS — R931 Abnormal findings on diagnostic imaging of heart and coronary circulation: Secondary | ICD-10-CM | POA: Insufficient documentation

## 2019-11-12 NOTE — Progress Notes (Signed)
Cardiology Office Note   Date:  11/13/2019   ID:  Matthew [name], DOB 09-Aug-1955, MRN [id number]  PCP:  Dettinger, [name], MD  Cardiologist:   No primary care provider on file.   Chief Complaint  Patient presents with  . Coronary Calcium      History of Present Illness: Matthew [name] is a 64 y.o. male who is referred by Dettinger, [name], MD presents for back pain.    Because of cardiovascular risk factors I sent him for a POET (Plain Old Exercise Treadmill) which was negative for ischemia.   He did however have significant coronary calcium at 842 which was 94th percentile for his age and gender.     Since I last saw him he has done well.  He previously had describes some back pain that he has at night and we wondered if this could be an anginal equivalent.  However, this actually gets better when he exercises and he did not have any of this when he had his treadmill test.  He had a very good exercise tolerance and I reviewed this again and there were no ischemic changes.  He has no chest discomfort.  He is able to do things like play pickle ball.  He walks 2 to 3 miles daily.  He does not bring on any symptoms with this.  He has no shortness of breath, PND or orthopnea.  Is not having any palpitations, presyncope or syncope.   Past Medical History:  Diagnosis Date  . Allergic rhinitis   . BPH (benign prostatic hyperplasia)   . Chronic constipation   . ED (erectile dysfunction)   . GERD (gastroesophageal reflux disease)   . Gout   . Heme positive stool    history of, normal colonoscopy  . History of sleep apnea    corrected after T&A procedure  . Hyperlipidemia   . Hypertension   . Microscopic hematuria   . Nocturia   . Urinary catheter in place    2018 x one month    Past Surgical History:  Procedure Laterality Date  . COLONOSCOPY  06/2017  . laser vaporization of prostate    . SHOULDER SURGERY Right   . THULIUM LASER TURP (TRANSURETHRAL RESECTION OF PROSTATE)  N/A 12/26/2017   Procedure: THULIUM LASER TURP (TRANSURETHRAL RESECTION OF PROSTATE);  Surgeon: Jerilee Field, MD;  Location: [facility];  Service: Urology;  Laterality: N/A;  . TONSILLECTOMY AND ADENOIDECTOMY  2009  . UPPER GI ENDOSCOPY       Current Outpatient Medications  Medication Sig Dispense Refill  . benazepril (LOTENSIN) 40 MG tablet TAKE 1 TABLET BY MOUTH EVERY DAY 90 tablet 1  . cyclobenzaprine (FLEXERIL) 10 MG tablet TAKE 1 TABLET BY MOUTH THREE TIMES A DAY AS NEEDED FOR MUSCLE SPASMS 60 tablet 1  . fluticasone (FLONASE) 50 MCG/ACT nasal spray Place into both nostrils as needed for allergies or rhinitis.    Marland Kitchen omeprazole (PRILOSEC) 40 MG capsule Take 40 mg by mouth every morning.    . polyethylene glycol (MIRALAX / GLYCOLAX) packet Take 17 g by mouth daily.    . rosuvastatin (CRESTOR) 20 MG tablet Take 40 mg by mouth daily.    . sildenafil (REVATIO) 20 MG tablet 2-5 tabs once daily as needed for ED 50 tablet 5  . tamsulosin (FLOMAX) 0.4 MG CAPS capsule Take 1 capsule (0.4 mg total) by mouth daily. 90 capsule 3  . Testosterone 20.25 MG/ACT (1.62%) GEL APPLY 2  PUMPS TOPICALLY DAILY. 75 g 1  . testosterone cypionate (DEPOTESTOTERONE CYPIONATE) 100 MG/ML injection Inject into the muscle every 14 (fourteen) days. For IM use only     No current facility-administered medications for this visit.    Allergies:   Patient has no known allergies.    ROS:  Please see the history of present illness.   Otherwise, review of systems are positive for none.   All other systems are reviewed and negative.    PHYSICAL EXAM: VS:  BP 132/85   Pulse 78   Ht 5\' 10"  (1.778 m)   Wt 219 lb (99.3 kg)   SpO2 99%   BMI 31.42 kg/m  , BMI Body mass index is 31.42 kg/m. GENERAL:  Well appearing NECK:  No jugular venous distention, waveform within normal limits, carotid upstroke brisk and symmetric, no bruits, no thyromegaly LUNGS:  Clear to auscultation bilaterally CHEST:   Unremarkable HEART:  PMI not displaced or sustained,S1 and S2 within normal limits, no S3, no S4, no clicks, no rubs, no murmurs ABD:  Flat, positive bowel sounds normal in frequency in pitch, no bruits, no rebound, no guarding, no midline pulsatile mass, no hepatomegaly, no splenomegaly EXT:  2 plus pulses throughout, no edema, no cyanosis no clubbing   EKG:  EKG is not ordered today. The ekg ordered today demonstrates sinus rhythm,    Recent Labs: 12/04/2018: ALT 24; BUN 26; Creatinine, Ser 1.65; Hemoglobin 16.8; Platelets 254; Potassium 4.5; Sodium 139    Lipid Panel    Component Value Date/Time   CHOL 166 09/24/2019 0820   CHOL 151 02/19/2013 0933   TRIG 182 (H) 09/24/2019 0820   TRIG 149 07/08/2014 0859   TRIG 195 (H) 02/19/2013 0933   HDL 35 (L) 09/24/2019 0820   HDL 42 07/08/2014 0859   HDL 43 02/19/2013 0933   CHOLHDL 4.7 09/24/2019 0820   LDLCALC 99 09/24/2019 0820   LDLCALC 69 02/19/2013 0933      Wt Readings from Last 3 Encounters:  11/13/19 219 lb (99.3 kg)  08/14/19 221 lb (100.2 kg)  07/05/19 220 lb 4 oz (99.9 kg)      Other studies Reviewed: Additional studies/ records that were reviewed today include: POET (Plain Old Exercise Treadmill), labs. Review of the above records demonstrates:  Please see elsewhere in the note.     ASSESSMENT AND PLAN:  Chest pain:   He had a negative POET (Plain Old Exercise Treadmill) with elevated coronary calcium.    We had a long discussion about this.  He has no symptoms consistent with angina.  Excellent exercise tolerance.  There is no suggestion of obstructive coronary artery disease though he has a high calcium score.  He is comfortable with this and comfortable with letting me know if he has any symptoms going forward.  I probably will screen him with another treadmill test in a year and until that time we will practice very aggressive risk reduction.  HTN: His blood pressure is controlled though upper limits here it is  very well controlled at home.  He will keep an eye on this and I suggested 5 pounds of weight loss for better management of his blood pressure.   Dyslipidemia:   He had his Crestor increased.  I will check a lipid profile in 8 weeks with a goal LDL less than 70.  Covid education: He has had his vaccines.  Current medicines are reviewed at length with the patient today.  The patient does not  have concerns regarding medicines.  The following changes have been made:  no change  Labs/ tests ordered today include:   Orders Placed This Encounter  Procedures  . Lipid panel  . Hepatic function panel     Disposition:   FU with me as needed based on the results of the above   Signed, Rollene Rotunda, MD  11/13/2019 2:27 PM    [facility]

## 2019-11-13 ENCOUNTER — Encounter: Payer: Self-pay | Admitting: Cardiology

## 2019-11-13 ENCOUNTER — Other Ambulatory Visit: Payer: Self-pay

## 2019-11-13 ENCOUNTER — Ambulatory Visit (INDEPENDENT_AMBULATORY_CARE_PROVIDER_SITE_OTHER): Payer: BC Managed Care – PPO | Admitting: Cardiology

## 2019-11-13 VITALS — BP 132/85 | HR 78 | Ht 70.0 in | Wt 219.0 lb

## 2019-11-13 DIAGNOSIS — I10 Essential (primary) hypertension: Secondary | ICD-10-CM

## 2019-11-13 DIAGNOSIS — R931 Abnormal findings on diagnostic imaging of heart and coronary circulation: Secondary | ICD-10-CM

## 2019-11-13 DIAGNOSIS — Z79899 Other long term (current) drug therapy: Secondary | ICD-10-CM | POA: Diagnosis not present

## 2019-11-13 DIAGNOSIS — E785 Hyperlipidemia, unspecified: Secondary | ICD-10-CM | POA: Diagnosis not present

## 2019-11-13 NOTE — Patient Instructions (Signed)
Medication Instructions:  The current medical regimen is effective;  continue present plan and medications.  *If you need a refill on your cardiac medications before your next appointment, please call your pharmacy*  Lab Work: Please have fasting lab work at [facility] in 8 weeks (Around 9/29) Lipid/liver profile.  If you have labs (blood work) drawn today and your tests are completely normal, you will receive your results only by: Marland Kitchen MyChart Message (if you have MyChart) OR . A paper copy in the mail If you have any lab test that is abnormal or we need to change your treatment, we will call you to review the results.  Follow-Up: At [facility], you and your health needs are our priority.  As part of our continuing mission to provide you with exceptional heart care, we have created designated Provider Care Teams.  These Care Teams include your primary Cardiologist (physician) and Advanced Practice Providers (APPs -  Physician Assistants and Nurse Practitioners) who all work together to provide you with the care you need, when you need it.  We recommend signing up for the patient portal called "MyChart".  Sign up information is provided on this After Visit Summary.  MyChart is used to connect with patients for Virtual Visits (Telemedicine).  Patients are able to view lab/test results, encounter notes, upcoming appointments, etc.  Non-urgent messages can be sent to your provider as well.   To learn more about what you can do with MyChart, go to [email or web address].    Your next appointment:   12 month(s)  The format for your next appointment:   In Person  Provider:   Rollene Rotunda, MD   Thank you for choosing [facility]!!

## 2019-12-09 ENCOUNTER — Encounter (INDEPENDENT_AMBULATORY_CARE_PROVIDER_SITE_OTHER): Payer: BC Managed Care – PPO | Admitting: Ophthalmology

## 2019-12-09 ENCOUNTER — Other Ambulatory Visit: Payer: Self-pay

## 2019-12-09 DIAGNOSIS — H33303 Unspecified retinal break, bilateral: Secondary | ICD-10-CM

## 2019-12-09 DIAGNOSIS — H43813 Vitreous degeneration, bilateral: Secondary | ICD-10-CM

## 2019-12-09 DIAGNOSIS — I10 Essential (primary) hypertension: Secondary | ICD-10-CM

## 2019-12-09 DIAGNOSIS — H35033 Hypertensive retinopathy, bilateral: Secondary | ICD-10-CM

## 2019-12-09 DIAGNOSIS — H35413 Lattice degeneration of retina, bilateral: Secondary | ICD-10-CM

## 2019-12-10 ENCOUNTER — Encounter (INDEPENDENT_AMBULATORY_CARE_PROVIDER_SITE_OTHER): Payer: BC Managed Care – PPO | Admitting: Ophthalmology

## 2019-12-10 DIAGNOSIS — H33302 Unspecified retinal break, left eye: Secondary | ICD-10-CM | POA: Diagnosis not present

## 2019-12-25 ENCOUNTER — Encounter (INDEPENDENT_AMBULATORY_CARE_PROVIDER_SITE_OTHER): Payer: BC Managed Care – PPO | Admitting: Ophthalmology

## 2019-12-25 ENCOUNTER — Other Ambulatory Visit: Payer: Self-pay

## 2019-12-25 DIAGNOSIS — H33303 Unspecified retinal break, bilateral: Secondary | ICD-10-CM

## 2020-01-06 ENCOUNTER — Other Ambulatory Visit: Payer: Self-pay

## 2020-01-06 ENCOUNTER — Ambulatory Visit: Payer: BC Managed Care – PPO | Admitting: Family Medicine

## 2020-01-06 ENCOUNTER — Encounter: Payer: Self-pay | Admitting: Family Medicine

## 2020-01-06 ENCOUNTER — Telehealth: Payer: Self-pay | Admitting: *Deleted

## 2020-01-06 VITALS — BP 147/83 | HR 101 | Temp 98.0°F | Ht 70.0 in | Wt 219.0 lb

## 2020-01-06 DIAGNOSIS — E349 Endocrine disorder, unspecified: Secondary | ICD-10-CM

## 2020-01-06 DIAGNOSIS — M546 Pain in thoracic spine: Secondary | ICD-10-CM | POA: Diagnosis not present

## 2020-01-06 DIAGNOSIS — I10 Essential (primary) hypertension: Secondary | ICD-10-CM | POA: Diagnosis not present

## 2020-01-06 DIAGNOSIS — E785 Hyperlipidemia, unspecified: Secondary | ICD-10-CM

## 2020-01-06 DIAGNOSIS — G8929 Other chronic pain: Secondary | ICD-10-CM

## 2020-01-06 MED ORDER — TESTOSTERONE 20.25 MG/ACT (1.62%) TD GEL: 2.0000 | Freq: Every day | TRANSDERMAL | 1 refills | Status: DC

## 2020-01-06 NOTE — Progress Notes (Signed)
BP (!) 147/83   Pulse (!) 101   Temp 98 F (36.7 C)   Ht 5\' 10"  (1.778 m)   Wt 219 lb (99.3 kg)   SpO2 98%   BMI 31.42 kg/m    Subjective:   Patient ID: Matthew [name], male    DOB: 12-08-1955, 64 y.o.   MRN: [id number]  HPI: [name] is a 65 y.o. male presenting on 01/06/2020 for Medical Management of Chronic Issues and Hypertension   HPI Hypertension Patient is currently on benazepril, and their blood pressure today is 147/83, he says it runs a lot better at home. Patient denies any lightheadedness or dizziness. Patient denies headaches, blurred vision, chest pains, shortness of breath, or weakness. Denies any side effects from medication and is content with current medication.   Hyperlipidemia Patient is coming in for recheck of his hyperlipidemia. The patient is currently taking Crestor. They deny any issues with myalgias or history of liver damage from it. They deny any focal numbness or weakness or chest pain.   Patient is coming in for recheck of testosterone deficiency, uses a testosterone gel and also gets injections.  He feels like it is helping him and doing well, will check blood work today.  Patient still complains of back pain mid back thoracic, has been using muscle relaxer and did a short course of prednisone earlier and it still bothering him, it does bother him most nights at bed most sleeping and sometimes keeps him up.  He says it does not bother him too much in the day but mostly at night.  He denies any radiation or numbness or weakness.  Relevant past medical, surgical, family and social history reviewed and updated as indicated. Interim medical history since our last visit reviewed. Allergies and medications reviewed and updated.  Review of Systems  Constitutional: Negative for chills and fever.  HENT: Negative for ear pain and tinnitus.   Respiratory: Negative for cough, shortness of breath and wheezing.   Cardiovascular: Negative for chest pain,  palpitations and leg swelling.  Gastrointestinal: Negative for abdominal pain, blood in stool, constipation and diarrhea.  Genitourinary: Negative for dysuria and hematuria.  Musculoskeletal: Negative for back pain and myalgias.  Skin: Negative for rash.  Neurological: Negative for dizziness, weakness and headaches.  Psychiatric/Behavioral: Negative for suicidal ideas.    Per HPI unless specifically indicated above   Allergies as of 01/06/2020   No Known Allergies     Medication List       Accurate as of January 06, 2020 11:11 AM. If you have any questions, ask your nurse or doctor.        benazepril 40 MG tablet Commonly known as: LOTENSIN TAKE 1 TABLET BY MOUTH EVERY DAY   cyclobenzaprine 10 MG tablet Commonly known as: FLEXERIL TAKE 1 TABLET BY MOUTH THREE TIMES A DAY AS NEEDED FOR MUSCLE SPASMS   fluticasone 50 MCG/ACT nasal spray Commonly known as: FLONASE Place into both nostrils as needed for allergies or rhinitis.   omeprazole 40 MG capsule Commonly known as: PRILOSEC Take 40 mg by mouth every morning.   polyethylene glycol 17 g packet Commonly known as: MIRALAX / GLYCOLAX Take 17 g by mouth daily.   rosuvastatin 20 MG tablet Commonly known as: CRESTOR Take 40 mg by mouth daily.   sildenafil 20 MG tablet Commonly known as: Revatio 2-5 tabs once daily as needed for ED   tamsulosin 0.4 MG Caps capsule Commonly known as: FLOMAX Take 1 capsule (  0.4 mg total) by mouth daily.   Testosterone 20.25 MG/ACT (1.62%) Gel APPLY 2 PUMPS TOPICALLY DAILY.   testosterone cypionate 100 MG/ML injection Commonly known as: DEPOTESTOTERONE CYPIONATE Inject into the muscle every 14 (fourteen) days. For IM use only        Objective:   BP (!) 147/83   Pulse (!) 101   Temp 98 F (36.7 C)   Ht _0  (1.778 m)   Wt 219 lb (99.3 kg)   SpO2 98%   BMI 31.42 kg/m   Wt Readings from Last 3 Encounters:  01/06/20 219 lb (99.3 kg)  11/13/19 219 lb (99.3 kg)   08/14/19 221 lb (100.2 kg)    Physical Exam Vitals and nursing note reviewed.  Constitutional:      General: He is not in acute distress.    Appearance: He is well-developed. He is not diaphoretic.  Eyes:     General: No scleral icterus.    Conjunctiva/sclera: Conjunctivae normal.  Neck:     Thyroid: No thyromegaly.  Cardiovascular:     Rate and Rhythm: Normal rate and regular rhythm.     Heart sounds: Normal heart sounds. No murmur heard.   Pulmonary:     Effort: Pulmonary effort is normal. No respiratory distress.     Breath sounds: Normal breath sounds. No wheezing.  Musculoskeletal:        General: No tenderness (No tenderness on exam and back). Normal range of motion.     Cervical back: Neck supple.  Lymphadenopathy:     Cervical: No cervical adenopathy.  Skin:    General: Skin is warm and dry.     Findings: No rash.  Neurological:     Mental Status: He is alert and oriented to person, place, and time.     Coordination: Coordination normal.  Psychiatric:        Behavior: Behavior normal.       Assessment & Plan:   Problem List Items Addressed This Visit      Cardiovascular and Mediastinum   Hypertension - Primary   Relevant Orders   CBC with Differential/Platelet   CMP14+EGFR     Other   Hyperlipidemia with target LDL less than 100   Relevant Orders   Lipid panel    Other Visit Diagnoses    Testosterone deficiency       Relevant Orders   Testosterone,Free and Total   Chronic midline thoracic back pain          Been over a year, had x-ray in November, still bothering him most every night. Follow up plan: Return in about 6 months (around 07/05/2020), or if symptoms worsen or fail to improve, for Hyperlipidemia and hypertension.  Counseling provided for all of the vaccine components No orders of the defined types were placed in this encounter.   [name], MD [facility] 01/06/2020, 11:11 AM

## 2020-01-06 NOTE — Telephone Encounter (Signed)
Matthew [name] [name] - PA Case ID: [id number] - Rx #: [id number]  Drug Testosterone 20.25 MG/ACT(1.62%) gel Form [employer] Electronic PA Form [phone] NCPDP) Original Claim Info 75 PRIOR AUTH REQ-MD CALL [phone].DRUG REQUIRES PRIOR AUTHORIZATION

## 2020-01-07 NOTE — Telephone Encounter (Signed)
Key: [facility] - PA Case ID: [id number] Need help? Call us at [phone] Status Sent to [employer] Drug Testosterone 20.25 MG/ACT(1.62%) gel Form [employer] Electronic PA Form [phone] NCPDP)   Your information has been submitted to [employer]. To check for an updated outcome later, reopen this PA request from your dashboard.  If [employer] has not responded to your request within 24 hours, contact [employer] at [phone]. If you think there may be a problem with your PA request, use our live chat feature at the bottom right.

## 2020-01-08 ENCOUNTER — Other Ambulatory Visit: Payer: BC Managed Care – PPO

## 2020-01-08 ENCOUNTER — Other Ambulatory Visit: Payer: Self-pay

## 2020-01-08 NOTE — Telephone Encounter (Signed)
Approved.  

## 2020-01-13 LAB — CBC WITH DIFFERENTIAL/PLATELET
Basophils Absolute: 0 10*3/uL (ref 0.0–0.2)
Basos: 1 %
EOS (ABSOLUTE): 0.2 10*3/uL (ref 0.0–0.4)
Eos: 4 %
Hematocrit: 47.4 % (ref 37.5–51.0)
Hemoglobin: 15.6 g/dL (ref 13.0–17.7)
Immature Grans (Abs): 0 10*3/uL (ref 0.0–0.1)
Immature Granulocytes: 0 %
Lymphocytes Absolute: 2.4 10*3/uL (ref 0.7–3.1)
Lymphs: 45 %
MCH: 29.1 pg (ref 26.6–33.0)
MCHC: 32.9 g/dL (ref 31.5–35.7)
MCV: 88 fL (ref 79–97)
Monocytes Absolute: 0.6 10*3/uL (ref 0.1–0.9)
Monocytes: 12 %
Neutrophils Absolute: 2 10*3/uL (ref 1.4–7.0)
Neutrophils: 38 %
Platelets: 224 10*3/uL (ref 150–450)
RBC: 5.36 x10E6/uL (ref 4.14–5.80)
RDW: 12.7 % (ref 11.6–15.4)
WBC: 5.3 10*3/uL (ref 3.4–10.8)

## 2020-01-13 LAB — CMP14+EGFR
ALT: 19 IU/L (ref 0–44)
AST: 23 IU/L (ref 0–40)
Albumin/Globulin Ratio: 1.8 (ref 1.2–2.2)
Albumin: 4.5 g/dL (ref 3.8–4.8)
Alkaline Phosphatase: 67 IU/L (ref 44–121)
BUN/Creatinine Ratio: 13 (ref 10–24)
BUN: 23 mg/dL (ref 8–27)
Bilirubin Total: 0.4 mg/dL (ref 0.0–1.2)
CO2: 24 mmol/L (ref 20–29)
Calcium: 9.4 mg/dL (ref 8.6–10.2)
Chloride: 104 mmol/L (ref 96–106)
Creatinine, Ser: 1.72 mg/dL — ABNORMAL HIGH (ref 0.76–1.27)
GFR calc Af Amer: 48 mL/min/{1.73_m2} — ABNORMAL LOW (ref >59)
GFR calc non Af Amer: 42 mL/min/{1.73_m2} — ABNORMAL LOW (ref >59)
Globulin, Total: 2.5 g/dL (ref 1.5–4.5)
Glucose: 89 mg/dL (ref 65–99)
Potassium: 4.4 mmol/L (ref 3.5–5.2)
Sodium: 144 mmol/L (ref 134–144)
Total Protein: 7 g/dL (ref 6.0–8.5)

## 2020-01-13 LAB — LIPID PANEL
Chol/HDL Ratio: 3.2 ratio (ref 0.0–5.0)
Cholesterol, Total: 126 mg/dL (ref 100–199)
HDL: 39 mg/dL — ABNORMAL LOW (ref >39)
LDL Chol Calc (NIH): 67 mg/dL (ref 0–99)
Triglycerides: 110 mg/dL (ref 0–149)
VLDL Cholesterol Cal: 20 mg/dL (ref 5–40)

## 2020-01-13 LAB — TESTOSTERONE,FREE AND TOTAL
Testosterone, Free: 16.1 pg/mL (ref 6.6–18.1)
Testosterone: 698 ng/dL (ref 264–916)

## 2020-01-14 ENCOUNTER — Encounter (INDEPENDENT_AMBULATORY_CARE_PROVIDER_SITE_OTHER): Payer: BC Managed Care – PPO | Admitting: Ophthalmology

## 2020-01-30 ENCOUNTER — Other Ambulatory Visit: Payer: Self-pay

## 2020-01-30 ENCOUNTER — Other Ambulatory Visit: Payer: BC Managed Care – PPO

## 2020-04-17 ENCOUNTER — Encounter: Payer: Self-pay | Admitting: Family Medicine

## 2020-04-18 ENCOUNTER — Other Ambulatory Visit: Payer: Self-pay | Admitting: Nurse Practitioner

## 2020-04-18 MED ORDER — OSELTAMIVIR PHOSPHATE 75 MG PO CAPS
75.0000 mg | ORAL_CAPSULE | Freq: Every day | ORAL | 0 refills | Status: DC
Start: 2020-04-18 — End: 2020-07-07

## 2020-04-18 NOTE — Progress Notes (Signed)
flu

## 2020-04-29 ENCOUNTER — Encounter (INDEPENDENT_AMBULATORY_CARE_PROVIDER_SITE_OTHER): Payer: BC Managed Care – PPO | Admitting: Ophthalmology

## 2020-05-04 ENCOUNTER — Encounter (INDEPENDENT_AMBULATORY_CARE_PROVIDER_SITE_OTHER): Payer: BC Managed Care – PPO | Admitting: Ophthalmology

## 2020-05-04 ENCOUNTER — Other Ambulatory Visit: Payer: Self-pay

## 2020-05-04 DIAGNOSIS — H33303 Unspecified retinal break, bilateral: Secondary | ICD-10-CM | POA: Diagnosis not present

## 2020-05-04 DIAGNOSIS — H43813 Vitreous degeneration, bilateral: Secondary | ICD-10-CM

## 2020-05-04 DIAGNOSIS — I10 Essential (primary) hypertension: Secondary | ICD-10-CM

## 2020-05-04 DIAGNOSIS — H35033 Hypertensive retinopathy, bilateral: Secondary | ICD-10-CM | POA: Diagnosis not present

## 2020-05-04 DIAGNOSIS — H2513 Age-related nuclear cataract, bilateral: Secondary | ICD-10-CM

## 2020-05-13 ENCOUNTER — Other Ambulatory Visit: Payer: Self-pay | Admitting: Family Medicine

## 2020-05-13 DIAGNOSIS — I10 Essential (primary) hypertension: Secondary | ICD-10-CM

## 2020-06-16 ENCOUNTER — Other Ambulatory Visit: Payer: Self-pay | Admitting: Family Medicine

## 2020-07-07 ENCOUNTER — Other Ambulatory Visit: Payer: Self-pay

## 2020-07-07 ENCOUNTER — Encounter: Payer: Self-pay | Admitting: Family Medicine

## 2020-07-07 ENCOUNTER — Ambulatory Visit: Payer: BC Managed Care – PPO | Admitting: Family Medicine

## 2020-07-07 VITALS — BP 117/64 | HR 71 | Temp 97.8°F | Ht 70.0 in | Wt 225.2 lb

## 2020-07-07 DIAGNOSIS — Z1211 Encounter for screening for malignant neoplasm of colon: Secondary | ICD-10-CM

## 2020-07-07 DIAGNOSIS — N529 Male erectile dysfunction, unspecified: Secondary | ICD-10-CM

## 2020-07-07 DIAGNOSIS — I10 Essential (primary) hypertension: Secondary | ICD-10-CM | POA: Diagnosis not present

## 2020-07-07 DIAGNOSIS — N1832 Chronic kidney disease, stage 3b: Secondary | ICD-10-CM

## 2020-07-07 DIAGNOSIS — E349 Endocrine disorder, unspecified: Secondary | ICD-10-CM

## 2020-07-07 DIAGNOSIS — E785 Hyperlipidemia, unspecified: Secondary | ICD-10-CM | POA: Diagnosis not present

## 2020-07-07 MED ORDER — TESTOSTERONE 20.25 MG/ACT (1.62%) TD GEL: 2.0000 | Freq: Every day | TRANSDERMAL | 1 refills | Status: DC

## 2020-07-07 MED ORDER — SILDENAFIL CITRATE 20 MG PO TABS: ORAL_TABLET | ORAL | 5 refills | Status: DC

## 2020-07-07 NOTE — Progress Notes (Signed)
BP 117/64   Pulse 71   Temp 97.8 F (36.6 C) (Temporal)   Ht 5' 10"  (1.778 m)   Wt 225 lb 3.2 oz (102.2 kg)   BMI 32.31 kg/m    Subjective:   Patient ID: Matthew [name], male    DOB: 1955/06/28, 65 y.o.   MRN: [id number]  HPI: Matthew [name] is a 65 y.o. male presenting on 07/07/2020 for Medical Management of Chronic Issues and Hypertension   HPI Hypertension Patient is currently on benazepril, and their blood pressure today is 117/64. Patient denies any lightheadedness or dizziness. Patient denies headaches, blurred vision, chest pains, shortness of breath, or weakness. Denies any side effects from medication and is content with current medication.   Hyperlipidemia Patient is coming in for recheck of his hyperlipidemia. The patient is currently taking Crestor. They deny any issues with myalgias or history of liver damage from it. They deny any focal numbness or weakness or chest pain.  He sees cardiology as well and has had cardiac rule out including stress test recently within the past year.  He also had elevated calcium score and they are monitoring  Patient has testosterone deficiency and renal dysfunction due to see urology once a year.  The only real complaint is he still gets this upper abdominal pain and indigestion but sometimes goes right through to his back that is worse at night.  Is not every day and all the time but it just happens sometimes.  Relevant past medical, surgical, family and social history reviewed and updated as indicated. Interim medical history since our last visit reviewed. Allergies and medications reviewed and updated.  Review of Systems  Constitutional: Negative for chills and fever.  Eyes: Negative for visual disturbance.  Respiratory: Negative for shortness of breath and wheezing.   Cardiovascular: Positive for chest pain. Negative for leg swelling.  Gastrointestinal: Positive for abdominal pain.  Musculoskeletal: Positive for back pain.  Negative for gait problem.  Skin: Negative for rash.  Neurological: Negative for dizziness, weakness and light-headedness.  All other systems reviewed and are negative.   Per HPI unless specifically indicated above   Allergies as of 07/07/2020   No Known Allergies     Medication List       Accurate as of July 07, 2020  9:51 AM. If you have any questions, ask your nurse or doctor.        STOP taking these medications   oseltamivir 75 MG capsule Commonly known as: Tamiflu Stopped by: Fransisca Kaufmann Fedra Lanter, MD     TAKE these medications   benazepril 40 MG tablet Commonly known as: LOTENSIN TAKE 1 TABLET BY MOUTH EVERY DAY   cyclobenzaprine 10 MG tablet Commonly known as: FLEXERIL TAKE 1 TABLET BY MOUTH THREE TIMES A DAY AS NEEDED FOR MUSCLE SPASMS   fluticasone 50 MCG/ACT nasal spray Commonly known as: FLONASE Place into both nostrils as needed for allergies or rhinitis.   omeprazole 40 MG capsule Commonly known as: PRILOSEC Take 40 mg by mouth every morning.   polyethylene glycol 17 g packet Commonly known as: MIRALAX / GLYCOLAX Take 17 g by mouth daily.   rosuvastatin 20 MG tablet Commonly known as: CRESTOR Take 40 mg by mouth daily.   sildenafil 20 MG tablet Commonly known as: REVATIO Take 2-5 tablets once daily as needed for e.d.   tamsulosin 0.4 MG Caps capsule Commonly known as: FLOMAX Take 1 capsule (0.4 mg total) by mouth daily.   Testosterone 20.25 MG/ACT (  1.62%) Gel Apply 2 Pump topically daily.   testosterone cypionate 100 MG/ML injection Commonly known as: DEPOTESTOTERONE CYPIONATE Inject into the muscle every 14 (fourteen) days. For IM use only        Objective:   BP 117/64   Pulse 71   Temp 97.8 F (36.6 C) (Temporal)   Ht 5' 10"  (1.778 m)   Wt 225 lb 3.2 oz (102.2 kg)   BMI 32.31 kg/m   Wt Readings from Last 3 Encounters:  07/07/20 225 lb 3.2 oz (102.2 kg)  01/06/20 219 lb (99.3 kg)  11/13/19 219 lb (99.3 kg)    Physical  Exam Vitals and nursing note reviewed.  Constitutional:      General: He is not in acute distress.    Appearance: He is well-developed. He is not diaphoretic.  Eyes:     General: No scleral icterus.    Conjunctiva/sclera: Conjunctivae normal.  Neck:     Thyroid: No thyromegaly.  Cardiovascular:     Rate and Rhythm: Normal rate and regular rhythm.     Heart sounds: Normal heart sounds. No murmur heard.   Pulmonary:     Effort: Pulmonary effort is normal. No respiratory distress.     Breath sounds: Normal breath sounds. No wheezing.  Abdominal:     General: Abdomen is flat. Bowel sounds are normal. There is no distension.     Tenderness: There is no abdominal tenderness. There is no guarding or rebound.  Musculoskeletal:        General: Normal range of motion.     Cervical back: Neck supple.  Lymphadenopathy:     Cervical: No cervical adenopathy.  Skin:    General: Skin is warm and dry.     Findings: No rash.  Neurological:     Mental Status: He is alert and oriented to person, place, and time.     Coordination: Coordination normal.  Psychiatric:        Behavior: Behavior normal.       Assessment & Plan:   Problem List Items Addressed This Visit      Cardiovascular and Mediastinum   Hypertension - Primary   Relevant Medications   sildenafil (REVATIO) 20 MG tablet   Other Relevant Orders   CBC with Differential/Platelet   CMP14+EGFR     Other   Hyperlipidemia with target LDL less than 100   Relevant Medications   sildenafil (REVATIO) 20 MG tablet   Other Relevant Orders   Lipid panel   Erectile dysfunction    Other Visit Diagnoses    Stage 3b chronic kidney disease ([place])       Colon cancer screening       Relevant Orders   Fecal occult blood, imunochemical   Testosterone deficiency       Relevant Orders   CBC with Differential/Platelet   Testosterone,Free and Total      No abdominal pain on exam, recommend try taking omeprazole in the evening rather  than in the morning and see if that makes a difference, if not call his gastroenterologist.  Continue current medication, will check blood work. Follow up plan: Return in about 6 months (around 01/07/2021), or if symptoms worsen or fail to improve, for Hypertension and hyperlipidemia.  Counseling provided for all of the vaccine components Orders Placed This Encounter  Procedures  . Fecal occult blood, imunochemical  . CBC with Differential/Platelet  . CMP14+EGFR  . Lipid panel  . Testosterone,Free and Total    [name], MD Tristan Schroeder  [place] 07/07/2020, 9:51 AM

## 2020-07-08 LAB — LIPID PANEL
Chol/HDL Ratio: 3.5 ratio (ref 0.0–5.0)
Cholesterol, Total: 138 mg/dL (ref 100–199)
HDL: 39 mg/dL — ABNORMAL LOW (ref >39)
LDL Chol Calc (NIH): 72 mg/dL (ref 0–99)
Triglycerides: 156 mg/dL — ABNORMAL HIGH (ref 0–149)
VLDL Cholesterol Cal: 27 mg/dL (ref 5–40)

## 2020-07-08 LAB — CBC WITH DIFFERENTIAL/PLATELET
Basophils Absolute: 0.1 10*3/uL (ref 0.0–0.2)
Basos: 1 %
EOS (ABSOLUTE): 0.2 10*3/uL (ref 0.0–0.4)
Eos: 2 %
Hematocrit: 47.3 % (ref 37.5–51.0)
Hemoglobin: 15.4 g/dL (ref 13.0–17.7)
Immature Grans (Abs): 0.1 10*3/uL (ref 0.0–0.1)
Immature Granulocytes: 1 %
Lymphocytes Absolute: 2.7 10*3/uL (ref 0.7–3.1)
Lymphs: 32 %
MCH: 28.4 pg (ref 26.6–33.0)
MCHC: 32.6 g/dL (ref 31.5–35.7)
MCV: 87 fL (ref 79–97)
Monocytes Absolute: 0.7 10*3/uL (ref 0.1–0.9)
Monocytes: 8 %
Neutrophils Absolute: 4.9 10*3/uL (ref 1.4–7.0)
Neutrophils: 56 %
Platelets: 345 10*3/uL (ref 150–450)
RBC: 5.43 x10E6/uL (ref 4.14–5.80)
RDW: 13.2 % (ref 11.6–15.4)
WBC: 8.6 10*3/uL (ref 3.4–10.8)

## 2020-07-08 LAB — CMP14+EGFR
ALT: 25 IU/L (ref 0–44)
AST: 30 IU/L (ref 0–40)
Albumin/Globulin Ratio: 1.8 (ref 1.2–2.2)
Albumin: 4.3 g/dL (ref 3.8–4.8)
Alkaline Phosphatase: 61 IU/L (ref 44–121)
BUN/Creatinine Ratio: 13 (ref 10–24)
BUN: 22 mg/dL (ref 8–27)
Bilirubin Total: 0.5 mg/dL (ref 0.0–1.2)
CO2: 25 mmol/L (ref 20–29)
Calcium: 9.5 mg/dL (ref 8.6–10.2)
Chloride: 101 mmol/L (ref 96–106)
Creatinine, Ser: 1.71 mg/dL — ABNORMAL HIGH (ref 0.76–1.27)
Globulin, Total: 2.4 g/dL (ref 1.5–4.5)
Glucose: 81 mg/dL (ref 65–99)
Potassium: 4.6 mmol/L (ref 3.5–5.2)
Sodium: 140 mmol/L (ref 134–144)
Total Protein: 6.7 g/dL (ref 6.0–8.5)
eGFR: 44 mL/min/{1.73_m2} — ABNORMAL LOW (ref >59)

## 2020-07-08 LAB — TESTOSTERONE,FREE AND TOTAL
Testosterone, Free: 13.5 pg/mL (ref 6.6–18.1)
Testosterone: 516 ng/dL (ref 264–916)

## 2020-07-09 ENCOUNTER — Other Ambulatory Visit: Payer: BC Managed Care – PPO

## 2020-07-09 DIAGNOSIS — Z1211 Encounter for screening for malignant neoplasm of colon: Secondary | ICD-10-CM

## 2020-07-10 LAB — FECAL OCCULT BLOOD, IMMUNOCHEMICAL: Fecal Occult Bld: NEGATIVE

## 2020-07-26 ENCOUNTER — Other Ambulatory Visit: Payer: Self-pay | Admitting: Family Medicine

## 2020-07-26 DIAGNOSIS — I10 Essential (primary) hypertension: Secondary | ICD-10-CM

## 2020-08-13 ENCOUNTER — Encounter: Payer: Self-pay | Admitting: Family Medicine

## 2020-08-13 ENCOUNTER — Ambulatory Visit: Payer: BC Managed Care – PPO | Admitting: Family Medicine

## 2020-08-13 ENCOUNTER — Other Ambulatory Visit: Payer: Self-pay

## 2020-08-13 VITALS — BP 128/79 | HR 72 | Temp 98.0°F

## 2020-08-13 DIAGNOSIS — B349 Viral infection, unspecified: Secondary | ICD-10-CM

## 2020-08-13 DIAGNOSIS — R1013 Epigastric pain: Secondary | ICD-10-CM | POA: Diagnosis not present

## 2020-08-13 LAB — VERITOR FLU A/B WAIVED
Influenza A: NEGATIVE
Influenza B: NEGATIVE

## 2020-08-13 MED ORDER — FAMOTIDINE 20 MG PO TABS: 20.0000 mg | ORAL_TABLET | Freq: Two times a day (BID) | ORAL | 0 refills | Status: DC

## 2020-08-13 MED ORDER — ONDANSETRON HCL 4 MG PO TABS: 4.0000 mg | ORAL_TABLET | Freq: Three times a day (TID) | ORAL | 0 refills | Status: DC | PRN

## 2020-08-13 NOTE — Progress Notes (Signed)
Acute Office Visit  Subjective:    Patient ID: Matthew [name], male    DOB: 26-Sep-1955, 65 y.o.   MRN: [id number]  Chief Complaint  Patient presents with  . Fever    Headache, body aches, stomach pain,     HPI Patient is in today for headache x 6 days. The headache is like a pressure behind his eyes. He had a fever on Friday and Saturday as high 101.2. No fever since. Reports dry cough. He also reports some stomach pain, decreased appetite, and fatigue. The stomach pain is a cramping in is the epigastric area. It is constant. Reports nausea but no vomiting.  He has had body aches as well. He has been having some constipation. Last BM was 3 days ago. He takes miralax daily. His urine is also dark. Denies dysuria. He has been staying well hydrated. Denies shortness of breath or chest pain. He has had 2 negative home Covid tests. He has been vaccinated and boosted against Covid. He has been immunized against flu as well.   Past Medical History:  Diagnosis Date  . Allergic rhinitis   . BPH (benign prostatic hyperplasia)   . Chronic constipation   . ED (erectile dysfunction)   . GERD (gastroesophageal reflux disease)   . Gout   . Heme positive stool    history of, normal colonoscopy  . History of sleep apnea    corrected after T&A procedure  . Hyperlipidemia   . Hypertension   . Microscopic hematuria   . Nocturia   . Urinary catheter in place    2018 x one month    Past Surgical History:  Procedure Laterality Date  . COLONOSCOPY  06/2017  . laser vaporization of prostate    . SHOULDER SURGERY Right   . THULIUM LASER TURP (TRANSURETHRAL RESECTION OF PROSTATE) N/A 12/26/2017   Procedure: THULIUM LASER TURP (TRANSURETHRAL RESECTION OF PROSTATE);  Surgeon: Festus Aloe, MD;  Location: [facility];  Service: Urology;  Laterality: N/A;  . TONSILLECTOMY AND ADENOIDECTOMY  2009  . UPPER GI ENDOSCOPY      Family History  Problem Relation Age of Onset  . Cancer  Mother   . Heart attack Father 62       Died age 79  . Prostate cancer Paternal Uncle     Social History   Socioeconomic History  . Marital status: Married    Spouse name: Not on file  . Number of children: Not on file  . Years of education: Not on file  . Highest education level: Not on file  Occupational History  . Not on file  Tobacco Use  . Smoking status: Never Smoker  . Smokeless tobacco: Never Used  Vaping Use  . Vaping Use: Never used  Substance and Sexual Activity  . Alcohol use: Yes    Comment: 2 per month  . Drug use: No  . Sexual activity: Not on file  Other Topics Concern  . Not on file  Social History Narrative  . Not on file   Social Determinants of Health   Financial Resource Strain: Not on file  Food Insecurity: Not on file  Transportation Needs: Not on file  Physical Activity: Not on file  Stress: Not on file  Social Connections: Not on file  Intimate Partner Violence: Not on file    Outpatient Medications Prior to Visit  Medication Sig Dispense Refill  . benazepril (LOTENSIN) 40 MG tablet TAKE 1 TABLET BY MOUTH EVERY DAY  90 tablet 1  . cyclobenzaprine (FLEXERIL) 10 MG tablet TAKE 1 TABLET BY MOUTH THREE TIMES A DAY AS NEEDED FOR MUSCLE SPASMS 60 tablet 1  . fluticasone (FLONASE) 50 MCG/ACT nasal spray Place into both nostrils as needed for allergies or rhinitis.    Marland Kitchen omeprazole (PRILOSEC) 40 MG capsule Take 40 mg by mouth every morning.    . polyethylene glycol (MIRALAX / GLYCOLAX) packet Take 17 g by mouth daily.    . rosuvastatin (CRESTOR) 20 MG tablet Take 40 mg by mouth daily.    . sildenafil (REVATIO) 20 MG tablet Take 2-5 tablets once daily as needed for e.d. 50 tablet 5  . tamsulosin (FLOMAX) 0.4 MG CAPS capsule Take 1 capsule (0.4 mg total) by mouth daily. 90 capsule 3  . Testosterone 20.25 MG/ACT (1.62%) GEL Apply 2 Pump topically daily. 75 g 1  . testosterone cypionate (DEPOTESTOTERONE CYPIONATE) 100 MG/ML injection Inject into the  muscle every 14 (fourteen) days. For IM use only     No facility-administered medications prior to visit.    No Known Allergies  Review of Systems As per HPI.     Objective:    Physical Exam Vitals and nursing note reviewed.  Constitutional:      General: He is not in acute distress.    Appearance: Normal appearance. He is not ill-appearing, toxic-appearing or diaphoretic.  HENT:     Head: Normocephalic and atraumatic.     Right Ear: Tympanic membrane, ear canal and external ear normal.     Left Ear: Tympanic membrane, ear canal and external ear normal.     Nose: Nose normal.     Mouth/Throat:     Mouth: Mucous membranes are moist.     Pharynx: Oropharynx is clear. No oropharyngeal exudate or posterior oropharyngeal erythema.  Cardiovascular:     Rate and Rhythm: Normal rate and regular rhythm.     Heart sounds: Normal heart sounds. No murmur heard.   Pulmonary:     Effort: Pulmonary effort is normal. No respiratory distress.     Breath sounds: Normal breath sounds. No wheezing, rhonchi or rales.  Chest:     Chest wall: No tenderness.  Abdominal:     General: Bowel sounds are normal. There is no distension.     Palpations: Abdomen is soft.     Tenderness: There is no abdominal tenderness. There is no right CVA tenderness, left CVA tenderness, guarding or rebound.  Musculoskeletal:     Right lower leg: No edema.     Left lower leg: No edema.  Skin:    General: Skin is warm and dry.  Neurological:     General: No focal deficit present.     Mental Status: He is alert and oriented to person, place, and time.  Psychiatric:        Mood and Affect: Mood normal.        Behavior: Behavior normal.     BP 128/79   Pulse 72   Temp 98 F (36.7 C) (Temporal)   SpO2 96%  Wt Readings from Last 3 Encounters:  07/07/20 225 lb 3.2 oz (102.2 kg)  01/06/20 219 lb (99.3 kg)  11/13/19 219 lb (99.3 kg)    There are no preventive care reminders to display for this  patient.  There are no preventive care reminders to display for this patient.   Lab Results  Component Value Date   TSH 1.860 07/08/2014   Lab Results  Component Value Date  WBC 8.6 07/07/2020   HGB 15.4 07/07/2020   HCT 47.3 07/07/2020   MCV 87 07/07/2020   PLT 345 07/07/2020   Lab Results  Component Value Date   NA 140 07/07/2020   K 4.6 07/07/2020   CO2 25 07/07/2020   GLUCOSE 81 07/07/2020   BUN 22 07/07/2020   CREATININE 1.71 (H) 07/07/2020   BILITOT 0.5 07/07/2020   ALKPHOS 61 07/07/2020   AST 30 07/07/2020   ALT 25 07/07/2020   PROT 6.7 07/07/2020   ALBUMIN 4.3 07/07/2020   CALCIUM 9.5 07/07/2020   EGFR 44 (L) 07/07/2020   Lab Results  Component Value Date   CHOL 138 07/07/2020   Lab Results  Component Value Date   HDL 39 (L) 07/07/2020   Lab Results  Component Value Date   LDLCALC 72 07/07/2020   Lab Results  Component Value Date   TRIG 156 (H) 07/07/2020   Lab Results  Component Value Date   CHOLHDL 3.5 07/07/2020   No results found for: HGBA1C     Assessment & Plan:   Mizael was seen today for fever.  Diagnoses and all orders for this visit:  Viral illness Rapid flu negative. Covid test pending, quarantine until results. Zofran as needed for nausea. Mucinex for congestion. Tylenol for headache or fever. Stay well hydrated, rest. Return to office for new or worsening symptoms, or if symptoms persist.  -     Novel Coronavirus, NAA ([facility]) -     Veritor Flu A/B Waived -     ondansetron (ZOFRAN) 4 MG tablet; Take 1 tablet (4 mg total) by mouth every 8 (eight) hours as needed for nausea or vomiting.  Epigastric pain Benign abdominal exam today. Pepcid as below.  -     famotidine (PEPCID) 20 MG tablet; Take 1 tablet (20 mg total) by mouth 2 (two) times daily.  Return to office for new or worsening symptoms, or if symptoms persist.   The patient indicates understanding of these issues and agrees with the plan.  Gwenlyn Perking, FNP

## 2020-08-14 ENCOUNTER — Ambulatory Visit: Payer: BC Managed Care – PPO | Admitting: Nurse Practitioner

## 2020-08-14 LAB — NOVEL CORONAVIRUS, NAA: SARS-CoV-2, NAA: NOT DETECTED

## 2020-08-14 LAB — SARS-COV-2, NAA 2 DAY TAT

## 2020-08-20 ENCOUNTER — Other Ambulatory Visit: Payer: Self-pay | Admitting: Family Medicine

## 2020-08-20 DIAGNOSIS — B349 Viral infection, unspecified: Secondary | ICD-10-CM

## 2020-08-20 DIAGNOSIS — R1013 Epigastric pain: Secondary | ICD-10-CM

## 2020-09-02 ENCOUNTER — Other Ambulatory Visit: Payer: Self-pay | Admitting: Family Medicine

## 2020-09-02 DIAGNOSIS — R1013 Epigastric pain: Secondary | ICD-10-CM

## 2020-09-02 DIAGNOSIS — B349 Viral infection, unspecified: Secondary | ICD-10-CM

## 2020-09-14 ENCOUNTER — Other Ambulatory Visit: Payer: Self-pay | Admitting: Family Medicine

## 2020-09-14 DIAGNOSIS — R1013 Epigastric pain: Secondary | ICD-10-CM

## 2020-09-14 DIAGNOSIS — B349 Viral infection, unspecified: Secondary | ICD-10-CM

## 2020-10-17 ENCOUNTER — Other Ambulatory Visit: Payer: Self-pay | Admitting: Cardiology

## 2020-10-25 ENCOUNTER — Other Ambulatory Visit: Payer: Self-pay | Admitting: Family Medicine

## 2020-10-25 DIAGNOSIS — R1013 Epigastric pain: Secondary | ICD-10-CM

## 2020-10-25 DIAGNOSIS — B349 Viral infection, unspecified: Secondary | ICD-10-CM

## 2020-11-08 ENCOUNTER — Other Ambulatory Visit: Payer: Self-pay | Admitting: Family Medicine

## 2020-11-08 DIAGNOSIS — R1013 Epigastric pain: Secondary | ICD-10-CM

## 2020-11-08 DIAGNOSIS — B349 Viral infection, unspecified: Secondary | ICD-10-CM

## 2020-11-16 ENCOUNTER — Encounter: Payer: Self-pay | Admitting: Family Medicine

## 2020-11-16 ENCOUNTER — Encounter: Payer: Self-pay | Admitting: Family

## 2020-11-16 ENCOUNTER — Ambulatory Visit: Payer: BC Managed Care – PPO | Admitting: Family

## 2020-11-16 DIAGNOSIS — U071 COVID-19: Secondary | ICD-10-CM | POA: Diagnosis not present

## 2020-11-16 MED ORDER — MOLNUPIRAVIR EUA 200MG CAPSULE: 4.0000 | ORAL_CAPSULE | Freq: Two times a day (BID) | ORAL | 0 refills | Status: AC

## 2020-11-16 NOTE — Progress Notes (Signed)
Virtual Visit Consent   Matthew [name], you are scheduled for a virtual visit with a [facility] provider today.     Just as with appointments in the office, your consent must be obtained to participate.  Your consent will be active for this visit and any virtual visit you may have with one of our providers in the next 365 days.     If you have a MyChart account, a copy of this consent can be sent to you electronically.  All virtual visits are billed to your insurance company just like a traditional visit in the office.    As this is a virtual visit, video technology does not allow for your provider to perform a traditional examination.  This may limit your provider's ability to fully assess your condition.  If your provider identifies any concerns that need to be evaluated in person or the need to arrange testing (such as labs, EKG, etc.), we will make arrangements to do so.     Although advances in technology are sophisticated, we cannot ensure that it will always work on either your end or our end.  If the connection with a video visit is poor, the visit may have to be switched to a telephone visit.  With either a video or telephone visit, we are not always able to ensure that we have a secure connection.     I need to obtain your verbal consent now.   Are you willing to proceed with your visit today?    Matthew [name] has provided verbal consent on 11/16/2020 for a virtual visit (video or telephone).   Jannifer Rodney, FNP   Date: 11/16/2020 12:09 PM   Virtual Visit via Video Note   I, Jannifer Rodney, connected with  Matthew [name]  ([id number], 05-02-1955) on 11/16/20 at 11:40 AM EDT by a video-enabled telemedicine application and verified that I am speaking with the correct person using two identifiers.  Location: Patient: Virtual Visit Location Patient: Home Provider: Virtual Visit Location Provider: Office/Clinic   I discussed the limitations of evaluation and management by  telemedicine and the availability of in person appointments. The patient expressed understanding and agreed to proceed.    History of Present Illness: Matthew [name] is a 65 y.o. who identifies as a male who was assigned male at birth, and is being seen today for COVID. He reports his symptoms started yesterday and tested positive yesterday.   HPI: Cough This is a new problem. The current episode started in the past 7 days. The problem has been waxing and waning. The problem occurs every few minutes. The cough is Non-productive. Associated symptoms include chills, a fever, headaches, nasal congestion and postnasal drip. Pertinent negatives include no ear congestion, ear pain, myalgias, shortness of breath or wheezing. He has tried rest (motrin) for the symptoms. The treatment provided mild relief.   Problems:  Patient Active Problem List   Diagnosis Date Noted   Elevated coronary artery calcium score 11/12/2019   Precordial chest pain 08/13/2019   Gout 03/15/2013   Hypertension 03/15/2013   Hyperlipidemia with target LDL less than 100 03/15/2013   Erectile dysfunction 03/15/2013    Allergies: No Known Allergies Medications:  Current Outpatient Medications:    molnupiravir EUA 200 mg CAPS, Take 4 capsules (800 mg total) by mouth 2 (two) times daily for 5 days., Disp: 40 capsule, Rfl: 0   benazepril (LOTENSIN) 40 MG tablet, TAKE 1 TABLET BY MOUTH EVERY DAY, Disp: 90 tablet,  Rfl: 1   cyclobenzaprine (FLEXERIL) 10 MG tablet, TAKE 1 TABLET BY MOUTH THREE TIMES A DAY AS NEEDED FOR MUSCLE SPASMS, Disp: 60 tablet, Rfl: 1   famotidine (PEPCID) 20 MG tablet, TAKE 1 TABLET BY MOUTH TWICE A DAY, Disp: 60 tablet, Rfl: 0   fluticasone (FLONASE) 50 MCG/ACT nasal spray, Place into both nostrils as needed for allergies or rhinitis., Disp: , Rfl:    omeprazole (PRILOSEC) 40 MG capsule, Take 40 mg by mouth every morning., Disp: , Rfl:    ondansetron (ZOFRAN) 4 MG tablet, Take 1 tablet (4 mg total) by mouth  every 8 (eight) hours as needed for nausea or vomiting., Disp: 20 tablet, Rfl: 0   polyethylene glycol (MIRALAX / GLYCOLAX) packet, Take 17 g by mouth daily., Disp: , Rfl:    rosuvastatin (CRESTOR) 20 MG tablet, Take 40 mg by mouth daily., Disp: , Rfl:    sildenafil (REVATIO) 20 MG tablet, Take 2-5 tablets once daily as needed for e.d., Disp: 50 tablet, Rfl: 5   tamsulosin (FLOMAX) 0.4 MG CAPS capsule, Take 1 capsule (0.4 mg total) by mouth daily., Disp: 90 capsule, Rfl: 3   Testosterone 20.25 MG/ACT (1.62%) GEL, Apply 2 Pump topically daily., Disp: 75 g, Rfl: 1   testosterone cypionate (DEPOTESTOTERONE CYPIONATE) 100 MG/ML injection, Inject into the muscle every 14 (fourteen) days. For IM use only, Disp: , Rfl:   Observations/Objective: Patient is well-developed, well-nourished in no acute distress.  Resting comfortably at home.  Head is normocephalic, atraumatic.  No labored breathing.  Speech is clear and coherent with logical content.  Patient is alert and oriented at baseline.    Assessment and Plan: 1. COVID-19 - molnupiravir EUA 200 mg CAPS; Take 4 capsules (800 mg total) by mouth 2 (two) times daily for 5 days.  Dispense: 40 capsule; Refill: 0 COVID positive, rest, force fluids, tylenol as needed, Quarantine for at least 5 days and fever free, report any worsening symptoms such as increased shortness of breath, swelling, or continued high fevers.  Possible adverse effects discussed  Follow Up Instructions: I discussed the assessment and treatment plan with the patient. The patient was provided an opportunity to ask questions and all were answered. The patient agreed with the plan and demonstrated an understanding of the instructions.  A copy of instructions were sent to the patient via MyChart.  The patient was advised to call back or seek an in-person evaluation if the symptoms worsen or if the condition fails to improve as anticipated.  Time:  I spent 10 minutes with the  patient via telehealth technology discussing the above problems/concerns.    Jannifer Rodney, FNP

## 2020-11-29 ENCOUNTER — Other Ambulatory Visit: Payer: Self-pay | Admitting: Cardiology

## 2020-12-06 ENCOUNTER — Other Ambulatory Visit: Payer: Self-pay | Admitting: Family Medicine

## 2020-12-06 DIAGNOSIS — R1013 Epigastric pain: Secondary | ICD-10-CM

## 2020-12-06 DIAGNOSIS — B349 Viral infection, unspecified: Secondary | ICD-10-CM

## 2021-01-02 ENCOUNTER — Other Ambulatory Visit: Payer: Self-pay | Admitting: Family Medicine

## 2021-01-02 DIAGNOSIS — B349 Viral infection, unspecified: Secondary | ICD-10-CM

## 2021-01-02 DIAGNOSIS — R1013 Epigastric pain: Secondary | ICD-10-CM

## 2021-01-08 ENCOUNTER — Other Ambulatory Visit: Payer: Self-pay

## 2021-01-08 ENCOUNTER — Ambulatory Visit (INDEPENDENT_AMBULATORY_CARE_PROVIDER_SITE_OTHER): Payer: BC Managed Care – PPO | Admitting: Family Medicine

## 2021-01-08 ENCOUNTER — Encounter: Payer: Self-pay | Admitting: Family Medicine

## 2021-01-08 VITALS — BP 127/84 | HR 83 | Ht 70.0 in | Wt 219.0 lb

## 2021-01-08 DIAGNOSIS — Z23 Encounter for immunization: Secondary | ICD-10-CM

## 2021-01-08 DIAGNOSIS — Z125 Encounter for screening for malignant neoplasm of prostate: Secondary | ICD-10-CM

## 2021-01-08 DIAGNOSIS — E785 Hyperlipidemia, unspecified: Secondary | ICD-10-CM | POA: Diagnosis not present

## 2021-01-08 DIAGNOSIS — I10 Essential (primary) hypertension: Secondary | ICD-10-CM

## 2021-01-08 DIAGNOSIS — R1013 Epigastric pain: Secondary | ICD-10-CM | POA: Diagnosis not present

## 2021-01-08 DIAGNOSIS — E349 Endocrine disorder, unspecified: Secondary | ICD-10-CM | POA: Diagnosis not present

## 2021-01-08 MED ORDER — TESTOSTERONE 20.25 MG/ACT (1.62%) TD GEL: 2.0000 | Freq: Every day | TRANSDERMAL | 1 refills | Status: DC

## 2021-01-08 MED ORDER — BENAZEPRIL HCL 40 MG PO TABS: 40.0000 mg | ORAL_TABLET | Freq: Every day | ORAL | 3 refills | Status: DC

## 2021-01-08 MED ORDER — TAMSULOSIN HCL 0.4 MG PO CAPS: 0.4000 mg | ORAL_CAPSULE | Freq: Every day | ORAL | 3 refills | Status: DC

## 2021-01-08 MED ORDER — FAMOTIDINE 20 MG PO TABS: 20.0000 mg | ORAL_TABLET | Freq: Two times a day (BID) | ORAL | 3 refills | Status: DC

## 2021-01-08 NOTE — Progress Notes (Signed)
BP 127/84   Pulse 83   Ht _0  (1.778 m)   Wt 219 lb (99.3 kg)   SpO2 98%   BMI 31.42 kg/m    Subjective:   Patient ID: Matthew [name], male    DOB: Aug 01, 1955, 65 y.o.   MRN: [id number]  HPI: Matthew [name] is a 65 y.o. male presenting on 01/08/2021 for Medical Management of Chronic Issues (CPE) and Hypertension   HPI Adult well exam and physical Patient denies any chest pain, shortness of breath, headaches or vision issues, diarrhea, nausea, vomiting, or joint issues.  He still has epigastric abdominal complaints but is seeing a gastroenterologist for this and is slotted for EGD.  Hyperlipidemia Patient is coming in for recheck of his hyperlipidemia. The patient is currently taking Crestor. They deny any issues with myalgias or history of liver damage from it. They deny any focal numbness or weakness or chest pain.   Hypertension Patient is currently on benazepril, and their blood pressure today is 127/84. Patient denies any lightheadedness or dizziness. Patient denies headaches, blurred vision, chest pains, shortness of breath, or weakness. Denies any side effects from medication and is content with current medication.   Testosterone deficiency recheck Patient currently takes testosterone and is coming in for testosterone deficiency recheck.  He says it does work well for him most of the time.  Relevant past medical, surgical, family and social history reviewed and updated as indicated. Interim medical history since our last visit reviewed. Allergies and medications reviewed and updated.  Review of Systems  Constitutional:  Negative for chills and fever.  HENT:  Negative for ear pain and tinnitus.   Eyes:  Negative for pain.  Respiratory:  Negative for cough, shortness of breath and wheezing.   Cardiovascular:  Negative for chest pain, palpitations and leg swelling.  Gastrointestinal:  Negative for abdominal pain, blood in stool, constipation and diarrhea.   Genitourinary:  Negative for dysuria and hematuria.  Musculoskeletal:  Negative for back pain and myalgias.  Skin:  Negative for rash.  Neurological:  Negative for dizziness, weakness and headaches.  Psychiatric/Behavioral:  Negative for suicidal ideas.    Per HPI unless specifically indicated above   Allergies as of 01/08/2021   No Known Allergies      Medication List        Accurate as of January 08, 2021 10:02 AM. If you have any questions, ask your nurse or doctor.          benazepril 40 MG tablet Commonly known as: LOTENSIN Take 1 tablet (40 mg total) by mouth daily.   cyclobenzaprine 10 MG tablet Commonly known as: FLEXERIL TAKE 1 TABLET BY MOUTH THREE TIMES A DAY AS NEEDED FOR MUSCLE SPASMS   famotidine 20 MG tablet Commonly known as: PEPCID Take 1 tablet (20 mg total) by mouth 2 (two) times daily.   fluticasone 50 MCG/ACT nasal spray Commonly known as: FLONASE Place into both nostrils as needed for allergies or rhinitis.   omeprazole 40 MG capsule Commonly known as: PRILOSEC Take 40 mg by mouth every morning.   ondansetron 4 MG tablet Commonly known as: Zofran Take 1 tablet (4 mg total) by mouth every 8 (eight) hours as needed for nausea or vomiting.   polyethylene glycol 17 g packet Commonly known as: MIRALAX / GLYCOLAX Take 17 g by mouth daily.   rosuvastatin 20 MG tablet Commonly known as: CRESTOR TAKE 1 TABLET BY MOUTH EVERY DAY   sildenafil 20 MG tablet Commonly  known as: REVATIO Take 2-5 tablets once daily as needed for e.d.   tamsulosin 0.4 MG Caps capsule Commonly known as: FLOMAX Take 1 capsule (0.4 mg total) by mouth daily.   Testosterone 20.25 MG/ACT (1.62%) Gel Apply 2 Pump topically daily.   testosterone cypionate 100 MG/ML injection Commonly known as: DEPOTESTOTERONE CYPIONATE Inject into the muscle every 14 (fourteen) days. For IM use only         Objective:   BP 127/84   Pulse 83   Ht _0  (1.778 m)   Wt 219  lb (99.3 kg)   SpO2 98%   BMI 31.42 kg/m   Wt Readings from Last 3 Encounters:  01/08/21 219 lb (99.3 kg)  07/07/20 225 lb 3.2 oz (102.2 kg)  01/06/20 219 lb (99.3 kg)    Physical Exam Vitals and nursing note reviewed.  Constitutional:      General: He is not in acute distress.    Appearance: He is well-developed. He is not diaphoretic.  HENT:     Right Ear: Tympanic membrane and ear canal normal.     Left Ear: Tympanic membrane and ear canal normal.     Mouth/Throat:     Mouth: Mucous membranes are moist.     Pharynx: No oropharyngeal exudate or posterior oropharyngeal erythema.  Eyes:     General: No scleral icterus.    Conjunctiva/sclera: Conjunctivae normal.  Neck:     Thyroid: No thyromegaly.  Cardiovascular:     Rate and Rhythm: Normal rate and regular rhythm.     Heart sounds: Normal heart sounds. No murmur heard. Pulmonary:     Effort: Pulmonary effort is normal. No respiratory distress.     Breath sounds: Normal breath sounds. No wheezing.  Abdominal:     General: Abdomen is flat. Bowel sounds are normal. There is no distension.     Tenderness: There is no abdominal tenderness. There is no right CVA tenderness, left CVA tenderness, guarding or rebound.  Musculoskeletal:        General: No swelling. Normal range of motion.     Cervical back: Neck supple.  Lymphadenopathy:     Cervical: No cervical adenopathy.  Skin:    General: Skin is warm and dry.     Findings: No rash.  Neurological:     Mental Status: He is alert and oriented to person, place, and time.     Coordination: Coordination normal.  Psychiatric:        Behavior: Behavior normal.      Assessment & Plan:   Problem List Items Addressed This Visit       Cardiovascular and Mediastinum   Hypertension   Relevant Medications   benazepril (LOTENSIN) 40 MG tablet     Other   Hyperlipidemia with target LDL less than 100   Relevant Medications   benazepril (LOTENSIN) 40 MG tablet   Other  Relevant Orders   CBC with Differential/Platelet   Lipid panel   Other Visit Diagnoses     Testosterone deficiency    -  Primary   Relevant Medications   Testosterone 20.25 MG/ACT (1.62%) GEL   Other Relevant Orders   CBC with Differential/Platelet   Testosterone,Free and Total   Essential hypertension       Relevant Medications   benazepril (LOTENSIN) 40 MG tablet   Other Relevant Orders   CBC with Differential/Platelet   CMP14+EGFR   Lipid panel   Epigastric pain       Relevant Medications  famotidine (PEPCID) 20 MG tablet   Prostate cancer screening       Relevant Orders   PSA, total and free       Blood pressure looks good, will await blood work.  Continue current medicine.  He continues to follow with both his nephrologist and his gastroenterologist, he is hoping that they can find some answers with the EGD coming up soon. Follow up plan: Return in about 6 months (around 07/08/2021), or if symptoms worsen or fail to improve, for Hypertension and testosterone and cholesterol recheck.  Counseling provided for all of the vaccine components Orders Placed This Encounter  Procedures   CBC with Differential/Platelet   CMP14+EGFR   Lipid panel   Testosterone,Free and Total   PSA, total and free    [name], MD [facility] 01/08/2021, 10:02 AM

## 2021-01-12 LAB — CBC WITH DIFFERENTIAL/PLATELET
Basophils Absolute: 0 10*3/uL (ref 0.0–0.2)
Basos: 1 %
EOS (ABSOLUTE): 0.1 10*3/uL (ref 0.0–0.4)
Eos: 2 %
Hematocrit: 50.5 % (ref 37.5–51.0)
Hemoglobin: 16.7 g/dL (ref 13.0–17.7)
Immature Grans (Abs): 0 10*3/uL (ref 0.0–0.1)
Immature Granulocytes: 0 %
Lymphocytes Absolute: 2.1 10*3/uL (ref 0.7–3.1)
Lymphs: 32 %
MCH: 28.1 pg (ref 26.6–33.0)
MCHC: 33.1 g/dL (ref 31.5–35.7)
MCV: 85 fL (ref 79–97)
Monocytes Absolute: 0.4 10*3/uL (ref 0.1–0.9)
Monocytes: 6 %
Neutrophils Absolute: 3.8 10*3/uL (ref 1.4–7.0)
Neutrophils: 59 %
Platelets: 280 10*3/uL (ref 150–450)
RBC: 5.95 x10E6/uL — ABNORMAL HIGH (ref 4.14–5.80)
RDW: 12.4 % (ref 11.6–15.4)
WBC: 6.4 10*3/uL (ref 3.4–10.8)

## 2021-01-12 LAB — CMP14+EGFR
ALT: 26 IU/L (ref 0–44)
AST: 25 IU/L (ref 0–40)
Albumin/Globulin Ratio: 2 (ref 1.2–2.2)
Albumin: 4.7 g/dL (ref 3.8–4.8)
Alkaline Phosphatase: 61 IU/L (ref 44–121)
BUN/Creatinine Ratio: 13 (ref 10–24)
BUN: 21 mg/dL (ref 8–27)
Bilirubin Total: 0.5 mg/dL (ref 0.0–1.2)
CO2: 24 mmol/L (ref 20–29)
Calcium: 9.9 mg/dL (ref 8.6–10.2)
Chloride: 101 mmol/L (ref 96–106)
Creatinine, Ser: 1.59 mg/dL — ABNORMAL HIGH (ref 0.76–1.27)
Globulin, Total: 2.3 g/dL (ref 1.5–4.5)
Glucose: 94 mg/dL (ref 70–99)
Potassium: 4.9 mmol/L (ref 3.5–5.2)
Sodium: 140 mmol/L (ref 134–144)
Total Protein: 7 g/dL (ref 6.0–8.5)
eGFR: 48 mL/min/{1.73_m2} — ABNORMAL LOW (ref >59)

## 2021-01-12 LAB — TESTOSTERONE,FREE AND TOTAL
Testosterone, Free: 8.7 pg/mL (ref 6.6–18.1)
Testosterone: 496 ng/dL (ref 264–916)

## 2021-01-12 LAB — PSA, TOTAL AND FREE
PSA, Free Pct: 18 %
PSA, Free: 0.36 ng/mL
Prostate Specific Ag, Serum: 2 ng/mL (ref 0.0–4.0)

## 2021-01-12 LAB — LIPID PANEL
Chol/HDL Ratio: 3.4 ratio (ref 0.0–5.0)
Cholesterol, Total: 141 mg/dL (ref 100–199)
HDL: 41 mg/dL (ref >39)
LDL Chol Calc (NIH): 68 mg/dL (ref 0–99)
Triglycerides: 190 mg/dL — ABNORMAL HIGH (ref 0–149)
VLDL Cholesterol Cal: 32 mg/dL (ref 5–40)

## 2021-01-14 ENCOUNTER — Encounter: Payer: Self-pay | Admitting: Family Medicine

## 2021-03-18 ENCOUNTER — Other Ambulatory Visit: Payer: BC Managed Care – PPO

## 2021-03-19 ENCOUNTER — Other Ambulatory Visit: Payer: Self-pay | Admitting: Nephrology

## 2021-03-19 ENCOUNTER — Other Ambulatory Visit (HOSPITAL_COMMUNITY): Payer: Self-pay | Admitting: Urology

## 2021-03-19 ENCOUNTER — Other Ambulatory Visit: Payer: Self-pay | Admitting: Urology

## 2021-03-19 ENCOUNTER — Other Ambulatory Visit (HOSPITAL_COMMUNITY): Payer: Self-pay | Admitting: Nephrology

## 2021-03-19 DIAGNOSIS — Z79899 Other long term (current) drug therapy: Secondary | ICD-10-CM

## 2021-03-19 DIAGNOSIS — R7303 Prediabetes: Secondary | ICD-10-CM

## 2021-03-19 DIAGNOSIS — R3914 Feeling of incomplete bladder emptying: Secondary | ICD-10-CM

## 2021-03-19 DIAGNOSIS — N189 Chronic kidney disease, unspecified: Secondary | ICD-10-CM

## 2021-03-23 ENCOUNTER — Other Ambulatory Visit (HOSPITAL_COMMUNITY): Payer: BC Managed Care – PPO

## 2021-03-25 ENCOUNTER — Telehealth: Payer: Self-pay | Admitting: Family Medicine

## 2021-03-25 NOTE — Telephone Encounter (Signed)
Left message for pt to return call.  Probably needs to be seen. [facility] does not have any openings. Is he willing to see another provider.

## 2021-03-26 ENCOUNTER — Other Ambulatory Visit: Payer: Self-pay

## 2021-03-26 ENCOUNTER — Ambulatory Visit: Payer: BC Managed Care – PPO | Admitting: Family Medicine

## 2021-03-26 ENCOUNTER — Encounter: Payer: Self-pay | Admitting: Family Medicine

## 2021-03-26 VITALS — BP 124/78 | HR 79 | Temp 98.4°F | Ht 70.0 in | Wt 226.0 lb

## 2021-03-26 DIAGNOSIS — M1000 Idiopathic gout, unspecified site: Secondary | ICD-10-CM

## 2021-03-26 MED ORDER — COLCHICINE 0.6 MG PO TABS: ORAL_TABLET | ORAL | 2 refills | Status: DC

## 2021-03-26 MED ORDER — FEBUXOSTAT 80 MG PO TABS: 80.0000 mg | ORAL_TABLET | Freq: Every day | ORAL | 1 refills | Status: DC

## 2021-03-26 NOTE — Progress Notes (Signed)
Chief Complaint  Patient presents with   Foot Pain    HPI  Patient presents today for foot pain for two weeks. Intense pain was noted at the base of the 1st toe at mtp joint bilaterally. Was bright red at first, but has started to fade. Still can't let sheets touch his feet. Hurts to wear shoes.  PMH: Smoking status noted ROS: Review of Systems  Constitutional: Negative.   Respiratory: Negative.    Musculoskeletal:  Positive for joint pain. Negative for back pain and myalgias.    Objective: BP 124/78    Pulse 79    Temp 98.4 F (36.9 C) (Temporal)    Ht 5\' 10"  (1.778 m)    Wt 226 lb (102.5 kg)    SpO2 99%    BMI 32.43 kg/m  Gen: NAD, alert, cooperative with exam HEENT: NCAT, EOMI, PERRL CV: RRR, good S1/S2, no murmur Resp: CTABL, no wheezes, non-labored Ext: No edema, warm. Tender at MTP bilaterally for mild percussion.  Neuro: Alert and oriented, No gross deficits  Assessment and plan:  1. Acute idiopathic gout, unspecified site     Meds ordered this encounter  Medications   colchicine 0.6 MG tablet    Sig: Take twice daily for gout attack. (may take every two hours up to 6 doses at acute onset)    Dispense:  60 tablet    Refill:  2   Febuxostat (ULORIC) 80 MG TABS    Sig: Take 1 tablet (80 mg total) by mouth daily.    Dispense:  90 tablet    Refill:  1    No orders of the defined types were placed in this encounter.   Follow up as needed.  , MD

## 2021-03-26 NOTE — Telephone Encounter (Signed)
Has appt scheduled.

## 2021-03-28 ENCOUNTER — Encounter: Payer: Self-pay | Admitting: Family Medicine

## 2021-04-21 ENCOUNTER — Encounter: Payer: Self-pay | Admitting: Family Medicine

## 2021-04-21 ENCOUNTER — Ambulatory Visit: Payer: BC Managed Care – PPO | Admitting: Family Medicine

## 2021-04-21 VITALS — HR 77 | Ht 70.0 in

## 2021-04-21 DIAGNOSIS — G8929 Other chronic pain: Secondary | ICD-10-CM | POA: Diagnosis not present

## 2021-04-21 DIAGNOSIS — G629 Polyneuropathy, unspecified: Secondary | ICD-10-CM

## 2021-04-21 DIAGNOSIS — M546 Pain in thoracic spine: Secondary | ICD-10-CM

## 2021-04-21 NOTE — Progress Notes (Signed)
Pulse 77    Ht 5\' 10"  (1.778 m)    SpO2 99%    BMI 32.43 kg/m    Subjective:   Patient ID: Matthew [name], male    DOB: Aug 15, 1955, 66 y.o.   MRN: [id number]  HPI: Matthew [name] is a 66 y.o. male presenting on 04/21/2021 for Gout (1 month follow up) and Back Pain   HPI Comes in for back pain with numbness and tingling the 3 middle toes on his feet he also has some numbness and tingling in both sides.  He has been fighting this back issues and then the numbness and tingling Since November 2020 but it has worsened over the past 6 months.  Patient had an x-ray showed thoracic spondylosis and scoliosis.  Relevant past medical, surgical, family and social history reviewed and updated as indicated. Interim medical history since our last visit reviewed. Allergies and medications reviewed and updated.  Review of Systems  Constitutional:  Negative for chills and fever.  Eyes:  Negative for visual disturbance.  Respiratory:  Negative for shortness of breath and wheezing.   Cardiovascular:  Negative for chest pain and leg swelling.  Musculoskeletal:  Positive for back pain. Negative for gait problem.  Skin:  Negative for rash.  Neurological:  Positive for numbness. Negative for dizziness, weakness and light-headedness.  All other systems reviewed and are negative.  Per HPI unless specifically indicated above   Allergies as of 04/21/2021   No Known Allergies      Medication List        Accurate as of April 21, 2021  4:50 PM. If you have any questions, ask your nurse or doctor.          STOP taking these medications    cyclobenzaprine 10 MG tablet Commonly known as: FLEXERIL Stopped by: April 23, 2021 Matthew Posa, MD   ondansetron 4 MG tablet Commonly known as: Zofran Stopped by: Matthew [name] Matthew Paternostro, MD       TAKE these medications    benazepril 40 MG tablet Commonly known as: LOTENSIN Take 1 tablet (40 mg total) by mouth daily.   colchicine 0.6 MG tablet Take twice  daily for gout attack. (may take every two hours up to 6 doses at acute onset)   famotidine 20 MG tablet Commonly known as: PEPCID Take 1 tablet (20 mg total) by mouth 2 (two) times daily.   Febuxostat 80 MG Tabs Commonly known as: Uloric Take 1 tablet (80 mg total) by mouth daily.   fluticasone 50 MCG/ACT nasal spray Commonly known as: FLONASE Place into both nostrils as needed for allergies or rhinitis.   omeprazole 40 MG capsule Commonly known as: PRILOSEC Take 40 mg by mouth every morning.   polyethylene glycol 17 g packet Commonly known as: MIRALAX / GLYCOLAX Take 17 g by mouth daily.   rosuvastatin 20 MG tablet Commonly known as: CRESTOR TAKE 1 TABLET BY MOUTH EVERY DAY   sildenafil 20 MG tablet Commonly known as: REVATIO Take 2-5 tablets once daily as needed for e.d.   tamsulosin 0.4 MG Caps capsule Commonly known as: FLOMAX Take 1 capsule (0.4 mg total) by mouth daily.   Testosterone 20.25 MG/ACT (1.62%) Gel Apply 2 Pump topically daily.   testosterone cypionate 100 MG/ML injection Commonly known as: DEPOTESTOTERONE CYPIONATE Inject into the muscle every 14 (fourteen) days. For IM use only         Objective:   Pulse 77    Ht 5\' 10"  (1.778 m)  SpO2 99%    BMI 32.43 kg/m   Wt Readings from Last 3 Encounters:  03/26/21 226 lb (102.5 kg)  01/08/21 219 lb (99.3 kg)  07/07/20 225 lb 3.2 oz (102.2 kg)    Physical Exam Vitals and nursing note reviewed.  Constitutional:      General: He is not in acute distress.    Appearance: He is well-developed. He is not diaphoretic.  Eyes:     General: No scleral icterus.    Conjunctiva/sclera: Conjunctivae normal.  Neck:     Thyroid: No thyromegaly.  Cardiovascular:     Rate and Rhythm: Normal rate and regular rhythm.     Heart sounds: Normal heart sounds. No murmur heard. Pulmonary:     Effort: Pulmonary effort is normal. No respiratory distress.     Breath sounds: Normal breath sounds. No wheezing.   Musculoskeletal:        General: Normal range of motion.     Cervical back: Neck supple.  Lymphadenopathy:     Cervical: No cervical adenopathy.  Skin:    General: Skin is warm and dry.     Findings: No rash.  Neurological:     Mental Status: He is alert and oriented to person, place, and time.     Coordination: Coordination normal.  Psychiatric:        Behavior: Behavior normal.      Assessment & Plan:   Problem List Items Addressed This Visit   None Visit Diagnoses     Neuropathy    -  Primary   Relevant Orders   TSH   Vitamin B12   BMP8+EGFR   Bayer DCA Hb A1c Waived   MR Lumbar Spine Wo Contrast   Chronic midline thoracic back pain       Relevant Orders   MR Lumbar Spine Wo Contrast       Will try for MRI, will do blood work to test for neuropathy as well. Follow up plan: Return if symptoms worsen or fail to improve.  Counseling provided for all of the vaccine components Orders Placed This Encounter  Procedures   MR Lumbar Spine Wo Contrast   TSH   Vitamin B12   BMP8+EGFR   Bayer DCA Hb A1c [place], MD [facility] 04/21/2021, 4:50 PM

## 2021-04-22 LAB — BMP8+EGFR
BUN/Creatinine Ratio: 14 (ref 10–24)
BUN: 25 mg/dL (ref 8–27)
CO2: 22 mmol/L (ref 20–29)
Calcium: 9.5 mg/dL (ref 8.6–10.2)
Chloride: 102 mmol/L (ref 96–106)
Creatinine, Ser: 1.81 mg/dL — ABNORMAL HIGH (ref 0.76–1.27)
Glucose: 104 mg/dL — ABNORMAL HIGH (ref 70–99)
Potassium: 4.4 mmol/L (ref 3.5–5.2)
Sodium: 141 mmol/L (ref 134–144)
eGFR: 41 mL/min/{1.73_m2} — ABNORMAL LOW (ref >59)

## 2021-04-22 LAB — VITAMIN B12: Vitamin B-12: 737 pg/mL (ref 232–1245)

## 2021-04-22 LAB — TSH: TSH: 1.77 u[IU]/mL (ref 0.450–4.500)

## 2021-04-22 LAB — BAYER DCA HB A1C WAIVED: HB A1C (BAYER DCA - WAIVED): 5.7 % — ABNORMAL HIGH (ref 4.8–5.6)

## 2021-04-29 ENCOUNTER — Telehealth: Payer: Self-pay | Admitting: Family Medicine

## 2021-04-29 NOTE — Telephone Encounter (Signed)
Pt calling to check on the status of MRI appt. Please call back.

## 2021-05-05 ENCOUNTER — Other Ambulatory Visit: Payer: Self-pay | Admitting: Family Medicine

## 2021-05-05 DIAGNOSIS — G629 Polyneuropathy, unspecified: Secondary | ICD-10-CM

## 2021-05-05 DIAGNOSIS — G8929 Other chronic pain: Secondary | ICD-10-CM

## 2021-05-05 NOTE — Progress Notes (Signed)
Placed referral for the patient. 

## 2021-05-20 ENCOUNTER — Other Ambulatory Visit: Payer: BC Managed Care – PPO

## 2021-05-25 ENCOUNTER — Other Ambulatory Visit: Payer: Self-pay | Admitting: Cardiology

## 2021-05-28 ENCOUNTER — Encounter: Payer: Self-pay | Admitting: Family Medicine

## 2021-06-02 MED ORDER — ROSUVASTATIN CALCIUM 20 MG PO TABS: 20.0000 mg | ORAL_TABLET | Freq: Every day | ORAL | 3 refills | Status: DC

## 2021-06-11 ENCOUNTER — Other Ambulatory Visit: Payer: Self-pay

## 2021-06-11 MED ORDER — ROSUVASTATIN CALCIUM 20 MG PO TABS: 20.0000 mg | ORAL_TABLET | Freq: Every day | ORAL | 3 refills | Status: DC

## 2021-06-30 ENCOUNTER — Other Ambulatory Visit: Payer: BC Managed Care – PPO

## 2021-07-08 ENCOUNTER — Ambulatory Visit: Payer: BC Managed Care – PPO | Admitting: Family Medicine

## 2021-07-12 ENCOUNTER — Encounter: Payer: Self-pay | Admitting: Family Medicine

## 2021-07-12 ENCOUNTER — Ambulatory Visit: Payer: BC Managed Care – PPO | Admitting: Family Medicine

## 2021-07-12 VITALS — BP 140/83 | HR 69 | Temp 97.6°F | Ht 70.0 in | Wt 227.2 lb

## 2021-07-12 DIAGNOSIS — E785 Hyperlipidemia, unspecified: Secondary | ICD-10-CM

## 2021-07-12 DIAGNOSIS — K828 Other specified diseases of gallbladder: Secondary | ICD-10-CM

## 2021-07-12 DIAGNOSIS — R1013 Epigastric pain: Secondary | ICD-10-CM | POA: Diagnosis not present

## 2021-07-12 DIAGNOSIS — I10 Essential (primary) hypertension: Secondary | ICD-10-CM | POA: Diagnosis not present

## 2021-07-12 DIAGNOSIS — N401 Enlarged prostate with lower urinary tract symptoms: Secondary | ICD-10-CM

## 2021-07-12 DIAGNOSIS — R3912 Poor urinary stream: Secondary | ICD-10-CM

## 2021-07-12 MED ORDER — COLCHICINE 0.6 MG PO TABS: ORAL_TABLET | ORAL | 2 refills | Status: DC

## 2021-07-12 NOTE — Progress Notes (Signed)
? ?BP 140/83   Pulse 69   Temp 97.6 ?F (36.4 ?C) (Temporal)   Ht 5' 10"  (1.778 m)   Wt 227 lb 3.2 oz (103.1 kg)   SpO2 99%   BMI 32.60 kg/m?   ? ?Subjective:  ? ?Patient ID: Matthew [name], male    DOB: 1956-04-08, 66 y.o.   MRN: [id number] ? ?HPI: ?Matthew [name] is a 66 y.o. male presenting on 07/12/2021 for Medical Management of Chronic Issues (PSA re checked ) and Gastroesophageal Reflux (Only at night when he lays down/) ? ? ?HPI ?Epigastric abdominal pain ?Patient has had persistent epigastric abdominal pain that occurs at night, he says it basically occurs every night and it wakes him up and will be a burning sharp pain and then he has trouble going back to sleep for couple hours.  It does get better after he is up and moving around and walking around.  He did see gastroenterology in November and had an EGD that was normal.  They recommended to try adding famotidine at bedtime with his omeprazole and then if not better in 3 months to get a CT scan of his abdomen and pelvis.  He says it has not changed or gotten better at all. ? ?Hypertension ?Patient is currently on benazepril, and their blood pressure today is 140/83. Patient denies any lightheadedness or dizziness. Patient denies headaches, blurred vision, chest pains, shortness of breath, or weakness. Denies any side effects from medication and is content with current medication.  ? ?Patient sees urology for BPH and takes Flomax.  He is due for his PSA.  He is planning in the near future to go for TURP ? ?Hyperlipidemia ?Patient is coming in for recheck of his hyperlipidemia. The patient is currently taking Crestor. They deny any issues with myalgias or history of liver damage from it. They deny any focal numbness or weakness or chest pain.  ? ?Relevant past medical, surgical, family and social history reviewed and updated as indicated. Interim medical history since our last visit reviewed. ?Allergies and medications reviewed and updated. ? ?Review of  Systems  ?Constitutional:  Negative for chills and fever.  ?Eyes:  Negative for visual disturbance.  ?Respiratory:  Negative for shortness of breath and wheezing.   ?Cardiovascular:  Negative for chest pain and leg swelling.  ?Musculoskeletal:  Negative for back pain and gait problem.  ?Skin:  Negative for rash.  ?Neurological:  Negative for dizziness, weakness and light-headedness.  ?All other systems reviewed and are negative. ? ?Per HPI unless specifically indicated above ? ? ?Allergies as of 07/12/2021   ?No Known Allergies ?  ? ?  ?Medication List  ?  ? ?  ? Accurate as of July 12, 2021 11:05 AM. If you have any questions, ask your nurse or doctor.  ?  ?  ? ?  ? ?benazepril 40 MG tablet ?Commonly known as: LOTENSIN ?Take 1 tablet (40 mg total) by mouth daily. ?  ?colchicine 0.6 MG tablet ?Take twice daily for gout attack. (may take every two hours up to 6 doses at acute onset) ?  ?famotidine 20 MG tablet ?Commonly known as: PEPCID ?Take 1 tablet (20 mg total) by mouth 2 (two) times daily. ?  ?Febuxostat 80 MG Tabs ?Commonly known as: Uloric ?Take 1 tablet (80 mg total) by mouth daily. ?  ?fluticasone 50 MCG/ACT nasal spray ?Commonly known as: FLONASE ?Place into both nostrils as needed for allergies or rhinitis. ?  ?omeprazole 40 MG capsule ?Commonly  known as: PRILOSEC ?Take 40 mg by mouth every morning. ?  ?polyethylene glycol 17 g packet ?Commonly known as: MIRALAX / GLYCOLAX ?Take 17 g by mouth daily. ?  ?rosuvastatin 20 MG tablet ?Commonly known as: CRESTOR ?Take 1 tablet (20 mg total) by mouth daily. ?  ?sildenafil 20 MG tablet ?Commonly known as: REVATIO ?Take 2-5 tablets once daily as needed for e.d. ?  ?tamsulosin 0.4 MG Caps capsule ?Commonly known as: FLOMAX ?Take 1 capsule (0.4 mg total) by mouth daily. ?  ?Testosterone 20.25 MG/ACT (1.62%) Gel ?Apply 2 Pump topically daily. ?  ?testosterone cypionate 100 MG/ML injection ?Commonly known as: DEPOTESTOTERONE CYPIONATE ?Inject into the muscle every 14  (fourteen) days. For IM use only ?  ? ?  ? ? ? ?Objective:  ? ?BP 140/83   Pulse 69   Temp 97.6 ?F (36.4 ?C) (Temporal)   Ht 5' 10"  (1.778 m)   Wt 227 lb 3.2 oz (103.1 kg)   SpO2 99%   BMI 32.60 kg/m?   ?Wt Readings from Last 3 Encounters:  ?07/12/21 227 lb 3.2 oz (103.1 kg)  ?03/26/21 226 lb (102.5 kg)  ?01/08/21 219 lb (99.3 kg)  ?  ?Physical Exam ?Vitals and nursing note reviewed.  ?Constitutional:   ?   General: He is not in acute distress. ?   Appearance: He is well-developed. He is not diaphoretic.  ?Eyes:  ?   General: No scleral icterus. ?   Conjunctiva/sclera: Conjunctivae normal.  ?Neck:  ?   Thyroid: No thyromegaly.  ?Cardiovascular:  ?   Rate and Rhythm: Normal rate and regular rhythm.  ?   Heart sounds: Normal heart sounds. No murmur heard. ?Pulmonary:  ?   Effort: Pulmonary effort is normal. No respiratory distress.  ?   Breath sounds: Normal breath sounds. No wheezing.  ?Musculoskeletal:     ?   General: Normal range of motion.  ?   Cervical back: Neck supple.  ?Lymphadenopathy:  ?   Cervical: No cervical adenopathy.  ?Skin: ?   General: Skin is warm and dry.  ?   Findings: No rash.  ?Neurological:  ?   Mental Status: He is alert and oriented to person, place, and time.  ?   Coordination: Coordination normal.  ?Psychiatric:     ?   Behavior: Behavior normal.  ? ? ? ? ?Assessment & Plan:  ? ?Problem List Items Addressed This Visit   ? ?  ? Cardiovascular and Mediastinum  ? Hypertension - Primary  ? Relevant Orders  ? CBC with Differential/Platelet  ? CMP14+EGFR  ?  ? Other  ? Hyperlipidemia with target LDL less than 100  ? Relevant Orders  ? CMP14+EGFR  ? Lipid panel  ? ?Other Visit Diagnoses   ? ? Epigastric pain      ? Relevant Orders  ? CT Abdomen Pelvis W Contrast  ? CBC with Differential/Platelet  ? Gallbladder sludge      ? Relevant Orders  ? CT Abdomen Pelvis W Contrast  ? Benign prostatic hyperplasia with weak urinary stream      ? ?  ?Patient has persistent epigastric abdominal pain and  has an EGD and per GI after trying 3 months of these medicines for reflux he has not seen any improvement in the GI recommendation was the neck step was to do a CT scan.  May consider general surgeon referral in the future for possible gallbladder issues but symptoms do not primarily fit that. ? ?Otherwise doing well,  continue current medicine otherwise. ? ? ? ?Follow up plan: ?Return in about 6 months (around 01/11/2022), or if symptoms worsen or fail to improve, for Hypertension and cholesterol and BPH recheck. ? ?Counseling provided for all of the vaccine components ?Orders Placed This Encounter  ?Procedures  ? CT Abdomen Pelvis W Contrast  ? CBC with Differential/Platelet  ? CMP14+EGFR  ? Lipid panel  ? ? ?[name], MD ?[place] ?07/12/2021, 11:05 AM ? ? ? ? ?

## 2021-07-12 NOTE — Addendum Note (Signed)
Addended by: Arville Care on: 07/12/2021 11:06 AM ? ? Modules accepted: Orders ? ?

## 2021-07-13 LAB — CMP14+EGFR
ALT: 18 IU/L (ref 0–44)
AST: 25 IU/L (ref 0–40)
Albumin/Globulin Ratio: 2.6 — ABNORMAL HIGH (ref 1.2–2.2)
Albumin: 4.7 g/dL (ref 3.8–4.8)
Alkaline Phosphatase: 63 IU/L (ref 44–121)
BUN/Creatinine Ratio: 14 (ref 10–24)
BUN: 21 mg/dL (ref 8–27)
Bilirubin Total: 0.3 mg/dL (ref 0.0–1.2)
CO2: 21 mmol/L (ref 20–29)
Calcium: 9.1 mg/dL (ref 8.6–10.2)
Chloride: 103 mmol/L (ref 96–106)
Creatinine, Ser: 1.5 mg/dL — ABNORMAL HIGH (ref 0.76–1.27)
Globulin, Total: 1.8 g/dL (ref 1.5–4.5)
Glucose: 95 mg/dL (ref 70–99)
Potassium: 4.9 mmol/L (ref 3.5–5.2)
Sodium: 140 mmol/L (ref 134–144)
Total Protein: 6.5 g/dL (ref 6.0–8.5)
eGFR: 52 mL/min/{1.73_m2} — ABNORMAL LOW (ref >59)

## 2021-07-13 LAB — LIPID PANEL
Chol/HDL Ratio: 3.8 ratio (ref 0.0–5.0)
Cholesterol, Total: 134 mg/dL (ref 100–199)
HDL: 35 mg/dL — ABNORMAL LOW (ref >39)
LDL Chol Calc (NIH): 66 mg/dL (ref 0–99)
Triglycerides: 199 mg/dL — ABNORMAL HIGH (ref 0–149)
VLDL Cholesterol Cal: 33 mg/dL (ref 5–40)

## 2021-07-13 LAB — CBC WITH DIFFERENTIAL/PLATELET
Basophils Absolute: 0.1 10*3/uL (ref 0.0–0.2)
Basos: 1 %
EOS (ABSOLUTE): 0.1 10*3/uL (ref 0.0–0.4)
Eos: 2 %
Hematocrit: 48.2 % (ref 37.5–51.0)
Hemoglobin: 16 g/dL (ref 13.0–17.7)
Immature Grans (Abs): 0 10*3/uL (ref 0.0–0.1)
Immature Granulocytes: 0 %
Lymphocytes Absolute: 1.7 10*3/uL (ref 0.7–3.1)
Lymphs: 30 %
MCH: 28.3 pg (ref 26.6–33.0)
MCHC: 33.2 g/dL (ref 31.5–35.7)
MCV: 85 fL (ref 79–97)
Monocytes Absolute: 0.4 10*3/uL (ref 0.1–0.9)
Monocytes: 7 %
Neutrophils Absolute: 3.4 10*3/uL (ref 1.4–7.0)
Neutrophils: 60 %
Platelets: 252 10*3/uL (ref 150–450)
RBC: 5.65 x10E6/uL (ref 4.14–5.80)
RDW: 13.8 % (ref 11.6–15.4)
WBC: 5.6 10*3/uL (ref 3.4–10.8)

## 2021-07-13 LAB — PSA, TOTAL AND FREE
PSA, Free Pct: 38 %
PSA, Free: 0.19 ng/mL
Prostate Specific Ag, Serum: 0.5 ng/mL (ref 0.0–4.0)

## 2021-07-14 ENCOUNTER — Telehealth: Payer: Self-pay | Admitting: Family Medicine

## 2021-07-21 ENCOUNTER — Encounter (HOSPITAL_BASED_OUTPATIENT_CLINIC_OR_DEPARTMENT_OTHER): Payer: Self-pay

## 2021-07-21 ENCOUNTER — Ambulatory Visit (HOSPITAL_BASED_OUTPATIENT_CLINIC_OR_DEPARTMENT_OTHER)
Admission: RE | Admit: 2021-07-21 | Discharge: 2021-07-21 | Disposition: A | Discharge status: Home / Self Care | Payer: BC Managed Care – PPO | Source: Ambulatory Visit | Attending: Family Medicine | Admitting: Family Medicine

## 2021-07-21 DIAGNOSIS — K828 Other specified diseases of gallbladder: Secondary | ICD-10-CM | POA: Diagnosis present

## 2021-07-21 DIAGNOSIS — R1013 Epigastric pain: Secondary | ICD-10-CM | POA: Insufficient documentation

## 2021-07-21 MED ORDER — IOHEXOL 300 MG/ML  SOLN
100.0000 mL | Freq: Once | INTRAMUSCULAR | Status: AC | PRN
  Administered 2021-07-21: 85 mL via INTRAVENOUS

## 2021-08-24 NOTE — Progress Notes (Signed)
?Terrilee Files D.O. ? [facility] ?[address] [place] [address] ?Phone: [phone] ?Subjective:   ?I, Wilford Grist, am serving as a scribe for Dr. Antoine Primas. ? ?This visit occurred during the SARS-CoV-2 public health emergency.  Safety protocols were in place, including screening questions prior to the visit, additional usage of staff PPE, and extensive cleaning of exam room while observing appropriate contact time as indicated for disinfecting solutions.  ? ? ?I'm seeing this patient by the request  of:  Dettinger, [name], MD ? ?CC:  ? ?[id number]  ?Matthew [name] is a 66 y.o. male coming in with complaint of thoracic spine pain for 3 years. Was told that his pain is acid reflux. Patient notes having bone spurs. Pain is worse at night and wakes patient up. Walking around will help alleviate his pain. Tried ergonomics, exercises.  ? ?  ? ?Past Medical History:  ?Diagnosis Date  ? Allergic rhinitis   ? BPH (benign prostatic hyperplasia)   ? Chronic constipation   ? ED (erectile dysfunction)   ? GERD (gastroesophageal reflux disease)   ? Gout   ? Heme positive stool   ? history of, normal colonoscopy  ? History of sleep apnea   ? corrected after T&A procedure  ? Hyperlipidemia   ? Hypertension   ? Microscopic hematuria   ? Nocturia   ? Urinary catheter in place   ? 2018 x one month  ? ?Past Surgical History:  ?Procedure Laterality Date  ? COLONOSCOPY  06/2017  ? laser vaporization of prostate    ? SHOULDER SURGERY Right   ? THULIUM LASER TURP (TRANSURETHRAL RESECTION OF PROSTATE) N/A 12/26/2017  ? Procedure: THULIUM LASER TURP (TRANSURETHRAL RESECTION OF PROSTATE);  Surgeon: Jerilee Field, MD;  Location: [facility];  Service: Urology;  Laterality: N/A;  ? TONSILLECTOMY AND ADENOIDECTOMY  2009  ? UPPER GI ENDOSCOPY    ? ?Social History  ? ?Socioeconomic History  ? Marital status: Married  ?  Spouse name: Not on file  ? Number of children: Not on file  ? Years of  education: Not on file  ? Highest education level: Not on file  ?Occupational History  ? Not on file  ?Tobacco Use  ? Smoking status: Never  ? Smokeless tobacco: Never  ?Vaping Use  ? Vaping Use: Never used  ?Substance and Sexual Activity  ? Alcohol use: Yes  ?  Comment: 2 per month  ? Drug use: No  ? Sexual activity: Not on file  ?Other Topics Concern  ? Not on file  ?Social History Narrative  ? Not on file  ? ?Social Determinants of Health  ? ?Financial Resource Strain: Not on file  ?Food Insecurity: Not on file  ?Transportation Needs: Not on file  ?Physical Activity: Not on file  ?Stress: Not on file  ?Social Connections: Not on file  ? ?No Known Allergies ?Family History  ?Problem Relation Age of Onset  ? Cancer Mother   ? Heart attack Father 30  ?     Died age 24  ? Prostate cancer Paternal Uncle   ? ? ?Current Outpatient Medications (Endocrine & Metabolic):  ?  Testosterone 20.25 MG/ACT (1.62%) GEL, Apply 2 Pump topically daily. ?  testosterone cypionate (DEPOTESTOTERONE CYPIONATE) 100 MG/ML injection, Inject into the muscle every 14 (fourteen) days. For IM use only ? ?Current Outpatient Medications (Cardiovascular):  ?  benazepril (LOTENSIN) 40 MG tablet, Take 1 tablet (40 mg total) by mouth daily. ?  rosuvastatin (CRESTOR) 20 MG tablet, Take 1 tablet (20 mg total) by mouth daily. ?  sildenafil (REVATIO) 20 MG tablet, Take 2-5 tablets once daily as needed for e.d. ? ?Current Outpatient Medications (Respiratory):  ?  fluticasone (FLONASE) 50 MCG/ACT nasal spray, Place into both nostrils as needed for allergies or rhinitis. ? ?Current Outpatient Medications (Analgesics):  ?  colchicine 0.6 MG tablet, Take twice daily for gout attack. (may take every two hours up to 6 doses at acute onset) ?  Febuxostat (ULORIC) 80 MG TABS, Take 1 tablet (80 mg total) by mouth daily. ? ? ?Current Outpatient Medications (Other):  ?  famotidine (PEPCID) 20 MG tablet, Take 1 tablet (20 mg total) by mouth 2 (two) times daily. ?   omeprazole (PRILOSEC) 40 MG capsule, Take 40 mg by mouth every morning. ?  polyethylene glycol (MIRALAX / GLYCOLAX) packet, Take 17 g by mouth daily. ?  tamsulosin (FLOMAX) 0.4 MG CAPS capsule, Take 1 capsule (0.4 mg total) by mouth daily. ? ? ?Reviewed prior external information including notes and imaging from  ?primary care provider ?As well as notes that were available from care everywhere and other healthcare systems. ? ?Past medical history, social, surgical and family history all reviewed in electronic medical record.  No pertanent information unless stated regarding to the chief complaint.  ? ?Review of Systems: ? No headache, visual changes, nausea, vomiting, diarrhea, constipation, dizziness, abdominal pain, skin rash, fevers, chills, night sweats, weight loss, swollen lymph nodes, body aches, joint swelling, chest pain, shortness of breath, mood changes. POSITIVE muscle aches ? ?Objective  ?There were no vitals taken for this visit. ?  ?General: No apparent distress alert and oriented x3 mood and affect normal, dressed appropriately.  ?HEENT: Pupils equal, extraocular movements intact  ?Respiratory: Patient's speak in full sentences and does not appear short of breath  ?Cardiovascular: No lower extremity edema, non tender, no erythema  ?Gait normal with good balance and coordination.  ?MSK:  Non tender with full range of motion and good stability and symmetric strength and tone of shoulders, elbows, wrist, hip, knee and ankles bilaterally.  ? ?  ?Impression and Recommendations:  ?  ? ?The above documentation has been reviewed and is accurate and complete Lorne Skeens ? ? ?

## 2021-08-24 NOTE — Progress Notes (Signed)
?Terrilee Files D.O. ?[facility] ?[address] [place] [address] ?Phone: [phone] ?Subjective:   ? ?I'm seeing this patient by the request  of:  Dettinger, [name], MD ? ?CC: back pain  ? ?[id number]  ?Matthew [name] is a 66 y.o. male with past medical history of gout and chronic kidney disease coming in with complaint of upper neck pain.  Has had this for nearly 3 years.  Seems to be the neck and the thoracic spine.  Has had work-up. ? ?Recent imaging includes a CT abdomen and pelvis to be done in April of this year.  Found to have a very tiny hiatal hernia and aortic atherosclerosis but otherwise unremarkable. ? ?Patient has been treated for gout as well as esophageal symptoms such as GERD.  States that that that is made improvements with the treatment with the Prilosec of the stomach but not having any help on the back. ?  ? ?Patient has had some thoracic x-rays previously as well that were independently visualized by me showing very mild degenerative disc disease. ? ?Past Medical History:  ?Diagnosis Date  ? Allergic rhinitis   ? BPH (benign prostatic hyperplasia)   ? Chronic constipation   ? ED (erectile dysfunction)   ? GERD (gastroesophageal reflux disease)   ? Gout   ? Heme positive stool   ? history of, normal colonoscopy  ? History of sleep apnea   ? corrected after T&A procedure  ? Hyperlipidemia   ? Hypertension   ? Microscopic hematuria   ? Nocturia   ? Urinary catheter in place   ? 2018 x one month  ? ?Past Surgical History:  ?Procedure Laterality Date  ? COLONOSCOPY  06/2017  ? laser vaporization of prostate    ? SHOULDER SURGERY Right   ? THULIUM LASER TURP (TRANSURETHRAL RESECTION OF PROSTATE) N/A 12/26/2017  ? Procedure: THULIUM LASER TURP (TRANSURETHRAL RESECTION OF PROSTATE);  Surgeon: Jerilee Field, MD;  Location: [facility];  Service: Urology;  Laterality: N/A;  ? TONSILLECTOMY AND ADENOIDECTOMY  2009  ? UPPER GI ENDOSCOPY    ? ?Social History   ? ?Socioeconomic History  ? Marital status: Married  ?  Spouse name: Not on file  ? Number of children: Not on file  ? Years of education: Not on file  ? Highest education level: Not on file  ?Occupational History  ? Not on file  ?Tobacco Use  ? Smoking status: Never  ? Smokeless tobacco: Never  ?Vaping Use  ? Vaping Use: Never used  ?Substance and Sexual Activity  ? Alcohol use: Yes  ?  Comment: 2 per month  ? Drug use: No  ? Sexual activity: Not on file  ?Other Topics Concern  ? Not on file  ?Social History Narrative  ? Not on file  ? ?Social Determinants of Health  ? ?Financial Resource Strain: Not on file  ?Food Insecurity: Not on file  ?Transportation Needs: Not on file  ?Physical Activity: Not on file  ?Stress: Not on file  ?Social Connections: Not on file  ? ?No Known Allergies ?Family History  ?Problem Relation Age of Onset  ? Cancer Mother   ? Heart attack Father 30  ?     Died age 41  ? Prostate cancer Paternal Uncle   ? ? ?Current Outpatient Medications (Endocrine & Metabolic):  ?  Testosterone 20.25 MG/ACT (1.62%) GEL, Apply 2 Pump topically daily. ?  testosterone cypionate (DEPOTESTOTERONE CYPIONATE) 100 MG/ML injection, Inject into  the muscle every 14 (fourteen) days. For IM use only ? ?Current Outpatient Medications (Cardiovascular):  ?  benazepril (LOTENSIN) 40 MG tablet, Take 1 tablet (40 mg total) by mouth daily. ?  rosuvastatin (CRESTOR) 20 MG tablet, Take 1 tablet (20 mg total) by mouth daily. ?  sildenafil (REVATIO) 20 MG tablet, Take 2-5 tablets once daily as needed for e.d. ? ?Current Outpatient Medications (Respiratory):  ?  fluticasone (FLONASE) 50 MCG/ACT nasal spray, Place into both nostrils as needed for allergies or rhinitis. ? ?Current Outpatient Medications (Analgesics):  ?  colchicine 0.6 MG tablet, Take twice daily for gout attack. (may take every two hours up to 6 doses at acute onset) ?  Febuxostat (ULORIC) 80 MG TABS, Take 1 tablet (80 mg total) by mouth daily. ? ? ?Current  Outpatient Medications (Other):  ?  famotidine (PEPCID) 20 MG tablet, Take 1 tablet (20 mg total) by mouth 2 (two) times daily. ?  omeprazole (PRILOSEC) 40 MG capsule, Take 40 mg by mouth every morning. ?  polyethylene glycol (MIRALAX / GLYCOLAX) packet, Take 17 g by mouth daily. ?  tamsulosin (FLOMAX) 0.4 MG CAPS capsule, Take 1 capsule (0.4 mg total) by mouth daily. ? ? ?Reviewed prior external information including notes and imaging from  ?primary care provider ?As well as notes that were available from care everywhere and other healthcare systems. ? ?Past medical history, social, surgical and family history all reviewed in electronic medical record.  No pertanent information unless stated regarding to the chief complaint.  ? ?Review of Systems: ? No headache, visual changes, nausea, vomiting, diarrhea, constipation, dizziness, abdominal pain, skin rash, fevers, chills, night sweats, weight loss, swollen lymph nodes, body aches, joint swelling, chest pain, shortness of breath, mood changes. POSITIVE muscle aches ? ?Objective  ?Blood pressure 120/82, pulse 76, height 5\' 10"  (1.778 m), weight 219 lb (99.3 kg), SpO2 97 %. ?  ?General: No apparent distress alert and oriented x3 mood and affect normal, dressed appropriately.  ?HEENT: Pupils equal, extraocular movements intact  ?Respiratory: Patient's speak in full sentences and does not appear short of breath  ?Cardiovascular: No lower extremity edema, non tender, no erythema  ?Gait normal with good balance and coordination.  ?MSK: Mild scapular dyskinesis noted left greater than right.  Tightness noted in the neck actually as well left. ?.  No significant midline tenderness noted.  No masses appreciated.  Does have unfortunately poor posture noted as well ? ? ?Osteopathic findings ?C2 flexed rotated and side bent left ?C6 flexed rotated and side bent left ?T3 extended rotated and side bent right inhaled third rib ?T7 extended rotated and side bent left ?L2 flexed  rotated and side bent right ?Sacrum right on right ? ? ?  ?Impression and Recommendations:  ?  ? ?The above documentation has been reviewed and is accurate and complete , DO ? ? ? ?

## 2021-08-25 ENCOUNTER — Ambulatory Visit: Payer: BC Managed Care – PPO | Admitting: Family Medicine

## 2021-08-25 DIAGNOSIS — G2589 Other specified extrapyramidal and movement disorders: Secondary | ICD-10-CM | POA: Diagnosis not present

## 2021-08-25 DIAGNOSIS — M9902 Segmental and somatic dysfunction of thoracic region: Secondary | ICD-10-CM | POA: Diagnosis not present

## 2021-08-25 NOTE — Assessment & Plan Note (Signed)
Does have some scapular dyskinesis.  Attempted osteopathic manipulation and did have some mild improvement with range of motion especially of the neck.  Patient still complaining more the pain at night.  Has been worked up fairly thoroughly though for patient's gastroenterology pathology.  Patient is to increase activity slowly.  We discussed potential iron supplementation that could be beneficial we discussed potential laboratory work-up which patient declined.  Has had a normal hemoglobin recently.  In addition of this patient is being followed for testosterone as well and is on supplementation.  Patient will see how he responds and follow-up with me again 6 to 8 weeks ?

## 2021-08-25 NOTE — Assessment & Plan Note (Signed)
   Decision today to treat with OMT was based on Physical Exam  After verbal consent patient was treated with HVLA, ME, FPR techniques in cervical, thoracic, rib,areas, all areas are chronic   Patient tolerated the procedure well with improvement in symptoms  Patient given exercises, stretches and lifestyle modifications  See medications in patient instructions if given  Patient will follow up in 4-8 weeks 

## 2021-08-25 NOTE — Patient Instructions (Signed)
Scapula exercises ?Iron 65mg  with 500mg  Vit C ?See me again in 5-6 weeks ?

## 2021-08-26 ENCOUNTER — Encounter: Payer: Self-pay | Admitting: Family Medicine

## 2021-09-17 ENCOUNTER — Other Ambulatory Visit: Payer: Self-pay | Admitting: Family Medicine

## 2021-09-29 NOTE — Progress Notes (Unsigned)
  [name] [address] [place] [address] Phone: [phone] Subjective:    I'm seeing this patient by the request  of:  Dettinger, [name], MD  CC:   [id number]  Matthew [name] is a 66 y.o. male coming in with complaint of back and neck pain. OMT on 08/25/2021. Patient states   Medications patient has been prescribed: None  Taking:         Reviewed prior external information including notes and imaging from previsou exam, outside providers and external EMR if available.   As well as notes that were available from care everywhere and other healthcare systems.  Past medical history, social, surgical and family history all reviewed in electronic medical record.  No pertanent information unless stated regarding to the chief complaint.   Past Medical History:  Diagnosis Date   Allergic rhinitis    BPH (benign prostatic hyperplasia)    Chronic constipation    ED (erectile dysfunction)    GERD (gastroesophageal reflux disease)    Gout    Heme positive stool    history of, normal colonoscopy   History of sleep apnea    corrected after T&A procedure   Hyperlipidemia    Hypertension    Microscopic hematuria    Nocturia    Urinary catheter in place    2018 x one month    No Known Allergies   Review of Systems:  No headache, visual changes, nausea, vomiting, diarrhea, constipation, dizziness, abdominal pain, skin rash, fevers, chills, night sweats, weight loss, swollen lymph nodes, body aches, joint swelling, chest pain, shortness of breath, mood changes. POSITIVE muscle aches  Objective  There were no vitals taken for this visit.   General: No apparent distress alert and oriented x3 mood and affect normal, dressed appropriately.  HEENT: Pupils equal, extraocular movements intact  Respiratory: Patient's speak in full sentences and does not appear short of breath  Cardiovascular: No lower extremity edema, non tender, no  erythema  Gait MSK:  Back   Osteopathic findings  C2 flexed rotated and side bent right C6 flexed rotated and side bent left T3 extended rotated and side bent right inhaled rib T9 extended rotated and side bent left L2 flexed rotated and side bent right Sacrum right on right       Assessment and Plan:  No problem-specific Assessment & Plan notes found for this encounter.    Nonallopathic problems  Decision today to treat with OMT was based on Physical Exam  After verbal consent patient was treated with HVLA, ME, FPR techniques in cervical, rib, thoracic, lumbar, and sacral  areas  Patient tolerated the procedure well with improvement in symptoms  Patient given exercises, stretches and lifestyle modifications  See medications in patient instructions if given  Patient will follow up in 4-8 weeks             Note: This dictation was prepared with Dragon dictation along with smaller phrase technology. Any transcriptional errors that result from this process are unintentional.

## 2021-10-04 ENCOUNTER — Other Ambulatory Visit: Payer: Self-pay | Admitting: Family Medicine

## 2021-10-05 ENCOUNTER — Ambulatory Visit: Payer: BC Managed Care – PPO | Admitting: Family Medicine

## 2021-10-05 VITALS — BP 124/80 | HR 90 | Ht 70.0 in | Wt 213.0 lb

## 2021-10-05 DIAGNOSIS — M9903 Segmental and somatic dysfunction of lumbar region: Secondary | ICD-10-CM | POA: Diagnosis not present

## 2021-10-05 DIAGNOSIS — G2589 Other specified extrapyramidal and movement disorders: Secondary | ICD-10-CM

## 2021-10-05 DIAGNOSIS — M9904 Segmental and somatic dysfunction of sacral region: Secondary | ICD-10-CM

## 2021-10-05 DIAGNOSIS — M9902 Segmental and somatic dysfunction of thoracic region: Secondary | ICD-10-CM

## 2021-10-05 DIAGNOSIS — M9908 Segmental and somatic dysfunction of rib cage: Secondary | ICD-10-CM | POA: Diagnosis not present

## 2021-10-05 DIAGNOSIS — M9901 Segmental and somatic dysfunction of cervical region: Secondary | ICD-10-CM

## 2021-10-05 MED ORDER — GABAPENTIN 100 MG PO CAPS: 100.0000 mg | ORAL_CAPSULE | Freq: Every day | ORAL | 3 refills | Status: DC

## 2021-10-08 ENCOUNTER — Other Ambulatory Visit: Payer: BC Managed Care – PPO

## 2021-10-20 ENCOUNTER — Other Ambulatory Visit: Payer: Self-pay | Admitting: Family Medicine

## 2021-10-25 IMAGING — CT CT CARDIAC CORONARY ARTERY CALCIUM SCORE
3 series · 14 of 20 positions shown, 15 images · non-contrast
Comparison: None.
COMPARISON: None.

Addendum:
EXAM:
OVER-READ INTERPRETATION  CT CHEST

The following report is an over-read performed by radiologist Dr.
Boo Reimer [[email or web address]] on 09/24/2019. This
over-read does not include interpretation of cardiac or coronary
anatomy or pathology. The coronary calcium score interpretation by
the cardiologist is attached.
CLINICAL DATA: Risk stratification
Coronary Calcium Score
TECHNIQUE: The patient was scanned on a Siemens Force scanner. Axial
non-contrast 3 mm slices were carried out through the heart. The
data set was analyzed on a dedicated work station and scored using
the Agatson method.

[Series 2: casc 3.0 bv41 2 bestsyst 38 % · axial · 0.43mm/px · z∈[-262,-181]mm · 4 of 45 slices shown, 5 images]
[im 9/45  vessel]
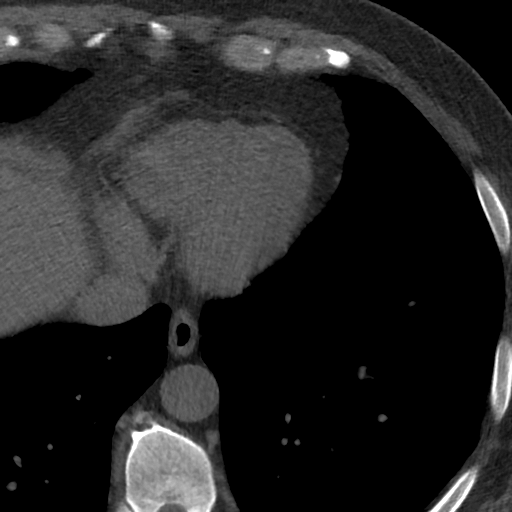
[im 9/45  lung]
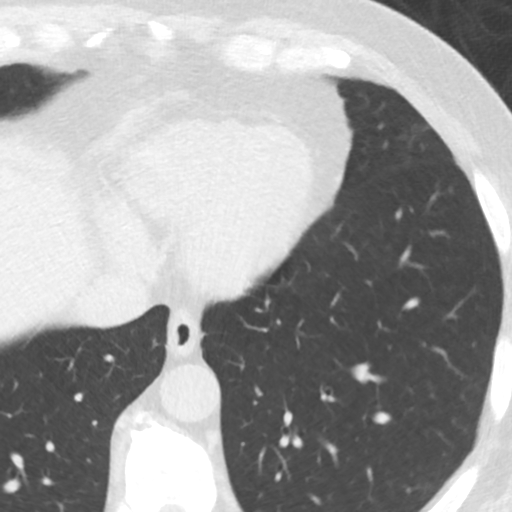
[im 18/45  vessel]
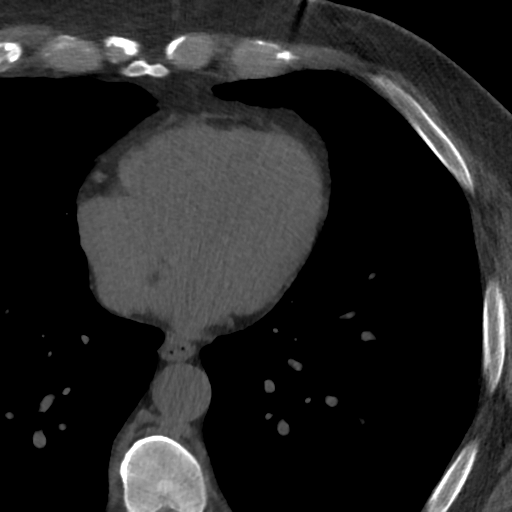
[im 27/45  vessel]
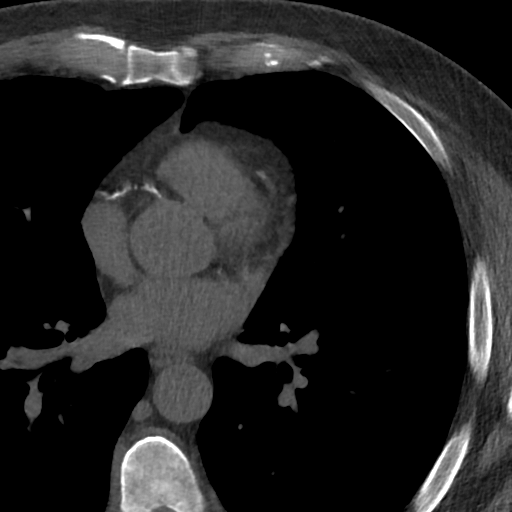
[im 36/45  vessel]
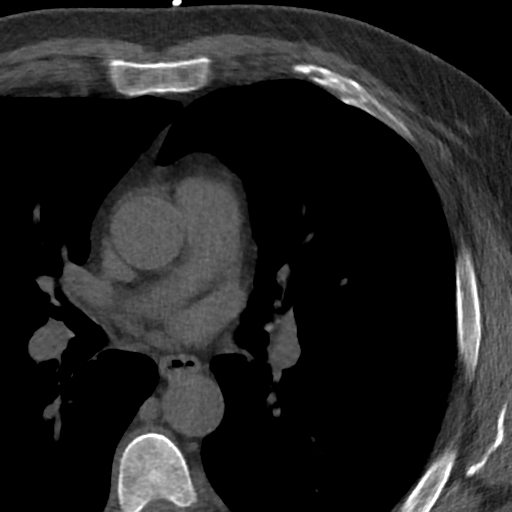

[Series 3: lung 38 % · axial · 0.73mm/px · z∈[-265,-178]mm · 5 of 45 slices shown]
[im 8/45  lung]
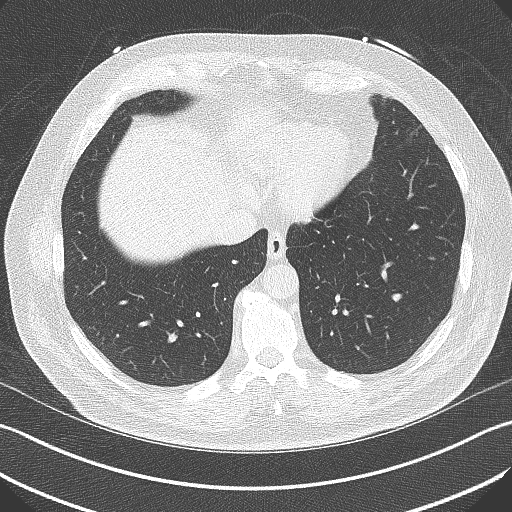
[im 15/45  lung]
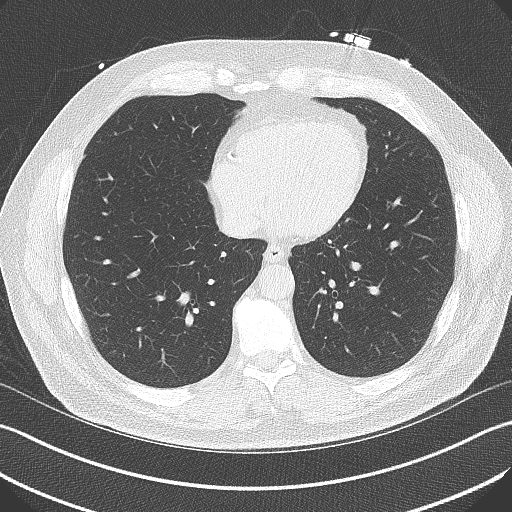
[im 23/45  lung]
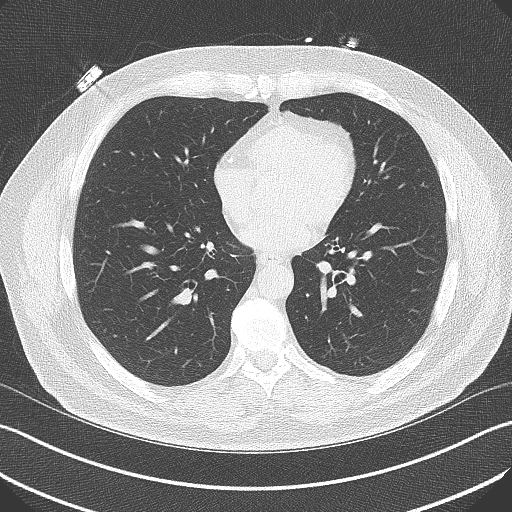
[im 30/45  lung]
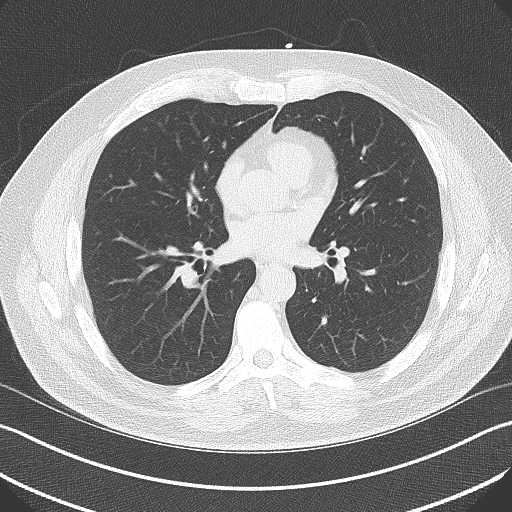
[im 37/45  lung]
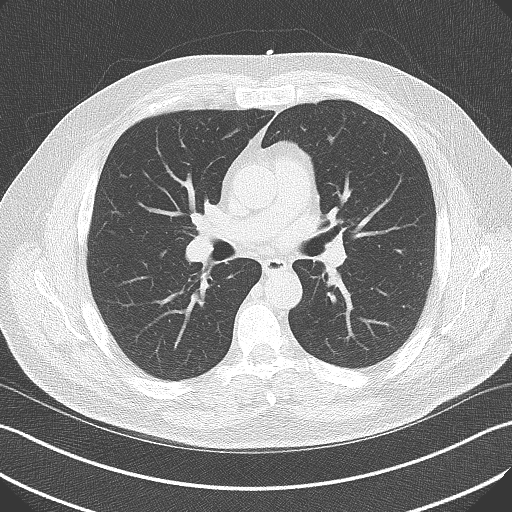

[Series 4: lung st 38 % · axial · 0.73mm/px · z∈[-265,-178]mm · 5 of 45 slices shown]
[im 8/45  lung]
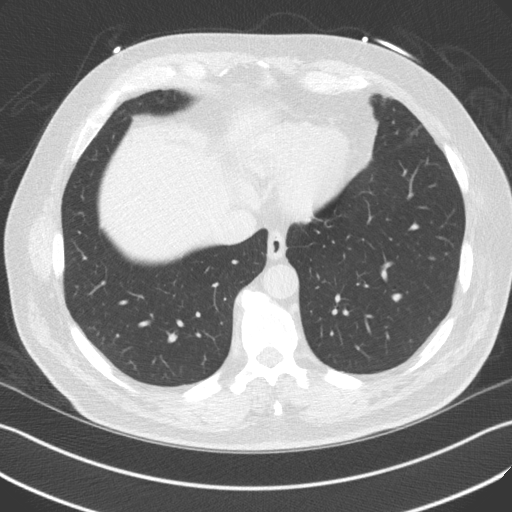
[im 15/45  lung]
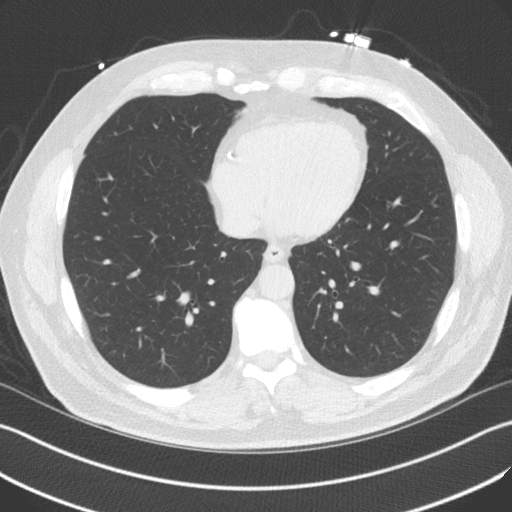
[im 23/45  lung]
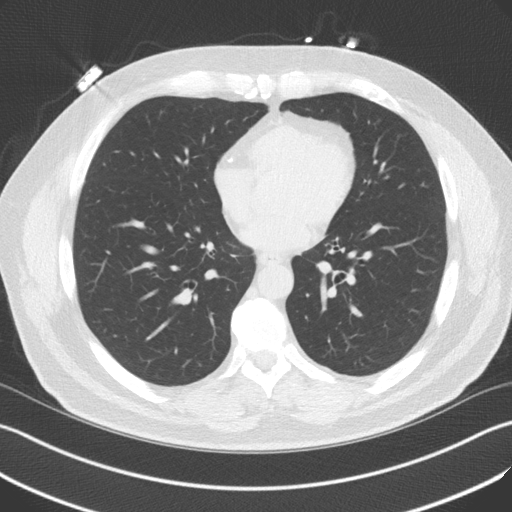
[im 30/45  lung]
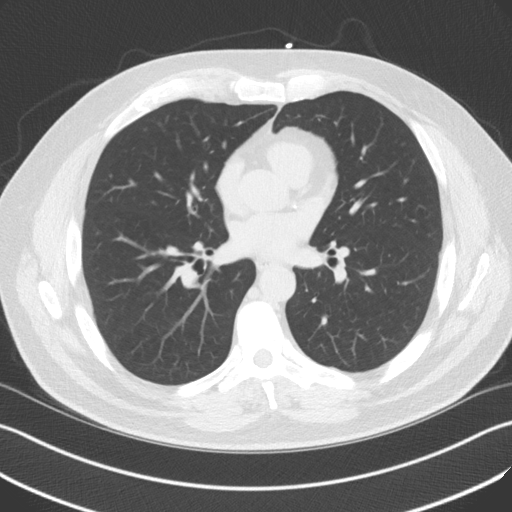
[im 37/45  lung]
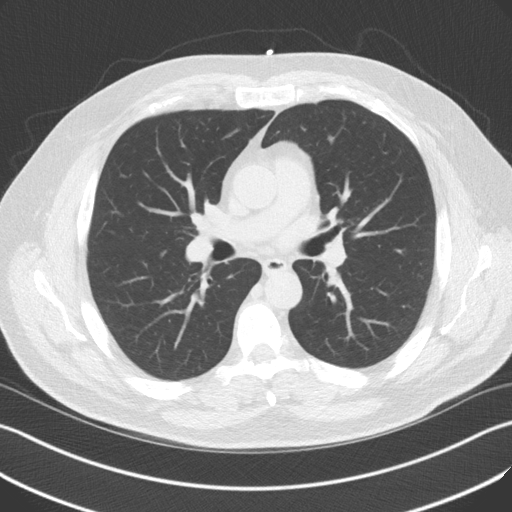

[14 of 20 positions shown; findings below may reference images not displayed]

FINDINGS: Within the visualized portions of the thorax there are no suspicious
appearing pulmonary nodules or masses, there is no acute
consolidative airspace disease, no pleural effusions, no
pneumothorax and no lymphadenopathy. Visualized portions of the
upper abdomen are unremarkable. There are no aggressive appearing
lytic or blastic lesions noted in the visualized portions of the
skeleton.
IMPRESSION: No significant incidental noncardiac findings are noted.
FINDINGS: Non-cardiac: See separate report from [REDACTED].

Ascending Aorta: Normal caliber.  No calcifications.

Pericardium: Normal

Coronary arteries: Normal coronary origins.
IMPRESSION: Coronary calcium score of 842. This was 94th percentile for age and
sex matched control.

Corazon Secrest

*** End of Addendum ***
EXAM:
OVER-READ INTERPRETATION  CT CHEST

The following report is an over-read performed by radiologist Dr.
Boo Reimer [REDACTED] on 09/24/2019. This
over-read does not include interpretation of cardiac or coronary
anatomy or pathology. The coronary calcium score interpretation by
the cardiologist is attached.
FINDINGS: Within the visualized portions of the thorax there are no suspicious
appearing pulmonary nodules or masses, there is no acute
consolidative airspace disease, no pleural effusions, no
pneumothorax and no lymphadenopathy. Visualized portions of the
upper abdomen are unremarkable. There are no aggressive appearing
lytic or blastic lesions noted in the visualized portions of the
skeleton.
IMPRESSION: No significant incidental noncardiac findings are noted.

## 2021-11-19 NOTE — Progress Notes (Deleted)
  [name] [address] [place] [address] Phone: [phone] Subjective:    I'm seeing this patient by the request  of:  Dettinger, [name], MD  CC:   [id number]  Matthew [name] is a 66 y.o. male coming in with complaint of back and neck pain. OMT on 10/05/2021. Patient states   Medications patient has been prescribed: Gabapentin  Taking:         Reviewed prior external information including notes and imaging from previsou exam, outside providers and external EMR if available.   As well as notes that were available from care everywhere and other healthcare systems.  Past medical history, social, surgical and family history all reviewed in electronic medical record.  No pertanent information unless stated regarding to the chief complaint.   Past Medical History:  Diagnosis Date   Allergic rhinitis    BPH (benign prostatic hyperplasia)    Chronic constipation    ED (erectile dysfunction)    GERD (gastroesophageal reflux disease)    Gout    Heme positive stool    history of, normal colonoscopy   History of sleep apnea    corrected after T&A procedure   Hyperlipidemia    Hypertension    Microscopic hematuria    Nocturia    Urinary catheter in place    2018 x one month    No Known Allergies   Review of Systems:  No headache, visual changes, nausea, vomiting, diarrhea, constipation, dizziness, abdominal pain, skin rash, fevers, chills, night sweats, weight loss, swollen lymph nodes, body aches, joint swelling, chest pain, shortness of breath, mood changes. POSITIVE muscle aches  Objective  There were no vitals taken for this visit.   General: No apparent distress alert and oriented x3 mood and affect normal, dressed appropriately.  HEENT: Pupils equal, extraocular movements intact  Respiratory: Patient's speak in full sentences and does not appear short of breath  Cardiovascular: No lower extremity edema, non tender,  no erythema  Gait MSK:  Back   Osteopathic findings  C2 flexed rotated and side bent right C6 flexed rotated and side bent left T3 extended rotated and side bent right inhaled rib T9 extended rotated and side bent left L2 flexed rotated and side bent right Sacrum right on right       Assessment and Plan:  No problem-specific Assessment & Plan notes found for this encounter.    Nonallopathic problems  Decision today to treat with OMT was based on Physical Exam  After verbal consent patient was treated with HVLA, ME, FPR techniques in cervical, rib, thoracic, lumbar, and sacral  areas  Patient tolerated the procedure well with improvement in symptoms  Patient given exercises, stretches and lifestyle modifications  See medications in patient instructions if given  Patient will follow up in 4-8 weeks             Note: This dictation was prepared with Dragon dictation along with smaller phrase technology. Any transcriptional errors that result from this process are unintentional.

## 2021-11-22 ENCOUNTER — Ambulatory Visit: Payer: BC Managed Care – PPO | Admitting: Family Medicine

## 2021-12-26 ENCOUNTER — Other Ambulatory Visit: Payer: Self-pay | Admitting: Family Medicine

## 2021-12-29 ENCOUNTER — Other Ambulatory Visit: Payer: Self-pay | Admitting: Family Medicine

## 2021-12-29 DIAGNOSIS — I10 Essential (primary) hypertension: Secondary | ICD-10-CM

## 2022-01-12 ENCOUNTER — Other Ambulatory Visit: Payer: BC Managed Care – PPO

## 2022-01-12 ENCOUNTER — Ambulatory Visit: Payer: BC Managed Care – PPO | Admitting: Family Medicine

## 2022-01-12 ENCOUNTER — Other Ambulatory Visit: Payer: Self-pay | Admitting: Family Medicine

## 2022-01-12 DIAGNOSIS — R1013 Epigastric pain: Secondary | ICD-10-CM

## 2022-01-17 ENCOUNTER — Encounter: Payer: Self-pay | Admitting: Family Medicine

## 2022-01-17 ENCOUNTER — Ambulatory Visit: Payer: BC Managed Care – PPO | Admitting: Family Medicine

## 2022-01-17 VITALS — BP 113/81 | HR 73 | Temp 97.4°F | Ht 70.0 in | Wt 195.0 lb

## 2022-01-17 DIAGNOSIS — R1013 Epigastric pain: Secondary | ICD-10-CM

## 2022-01-17 DIAGNOSIS — N1832 Chronic kidney disease, stage 3b: Secondary | ICD-10-CM

## 2022-01-17 DIAGNOSIS — Z23 Encounter for immunization: Secondary | ICD-10-CM

## 2022-01-17 DIAGNOSIS — E349 Endocrine disorder, unspecified: Secondary | ICD-10-CM

## 2022-01-17 DIAGNOSIS — E785 Hyperlipidemia, unspecified: Secondary | ICD-10-CM | POA: Diagnosis not present

## 2022-01-17 DIAGNOSIS — I10 Essential (primary) hypertension: Secondary | ICD-10-CM | POA: Diagnosis not present

## 2022-01-17 DIAGNOSIS — M1000 Idiopathic gout, unspecified site: Secondary | ICD-10-CM

## 2022-01-17 LAB — BAYER DCA HB A1C WAIVED: HB A1C (BAYER DCA - WAIVED): 5.7 % — ABNORMAL HIGH (ref 4.8–5.6)

## 2022-01-17 MED ORDER — BENAZEPRIL HCL 40 MG PO TABS: 40.0000 mg | ORAL_TABLET | Freq: Every day | ORAL | 3 refills | Status: DC

## 2022-01-17 MED ORDER — FAMOTIDINE 20 MG PO TABS: 20.0000 mg | ORAL_TABLET | Freq: Two times a day (BID) | ORAL | 1 refills | Status: DC

## 2022-01-17 MED ORDER — FEBUXOSTAT 80 MG PO TABS: 1.0000 | ORAL_TABLET | Freq: Every day | ORAL | 1 refills | Status: DC

## 2022-01-17 MED ORDER — TESTOSTERONE 20.25 MG/ACT (1.62%) TD GEL: 2.0000 | Freq: Every day | TRANSDERMAL | 1 refills | Status: DC

## 2022-01-17 MED ORDER — SILDENAFIL CITRATE 20 MG PO TABS: 20.0000 mg | ORAL_TABLET | Freq: Every day | ORAL | 5 refills | Status: DC | PRN

## 2022-01-17 MED ORDER — COLCHICINE 0.6 MG PO TABS: ORAL_TABLET | ORAL | 1 refills | Status: DC

## 2022-01-17 MED ORDER — TAMSULOSIN HCL 0.4 MG PO CAPS: 0.4000 mg | ORAL_CAPSULE | Freq: Every day | ORAL | 3 refills | Status: DC

## 2022-01-17 NOTE — Progress Notes (Signed)
BP 113/81   Pulse 73   Temp (!) 97.4 F (36.3 C)   Ht 5' 10"  (1.778 m)   Wt 195 lb (88.5 kg)   SpO2 98%   BMI 27.98 kg/m    Subjective:   Patient ID: Matthew [name], male    DOB: Aug 29, 1955, 66 y.o.   MRN: [id number]  HPI: Matthew [name] is a 66 y.o. male presenting on 01/17/2022 for Abdominal Pain (Epigastric area. Radiates from throat)   HPI Gout Last attack: 6 months ago Attacks this year: 1 Medication: Colchicine Location of attacks: Right foot  Hypertension Patient is currently on benazepril, and their blood pressure today is 113/81. Patient denies any lightheadedness or dizziness. Patient denies headaches, blurred vision, chest pains, shortness of breath, or weakness. Denies any side effects from medication and is content with current medication.   Hyperlipidemia Patient is coming in for recheck of his hyperlipidemia. The patient is currently taking Crestor. They deny any issues with myalgias or history of liver damage from it. They deny any focal numbness or weakness or chest pain.   Relevant past medical, surgical, family and social history reviewed and updated as indicated. Interim medical history since our last visit reviewed. Allergies and medications reviewed and updated.  Review of Systems  Constitutional:  Negative for chills and fever.  Eyes:  Negative for visual disturbance.  Respiratory:  Negative for shortness of breath and wheezing.   Cardiovascular:  Negative for chest pain and leg swelling.  Musculoskeletal:  Negative for back pain and gait problem.  Skin:  Negative for rash.  Neurological:  Negative for dizziness, weakness and light-headedness.  All other systems reviewed and are negative.   Per HPI unless specifically indicated above   Allergies as of 01/17/2022   No Known Allergies      Medication List        Accurate as of January 17, 2022  2:59 PM. If you have any questions, ask your nurse or doctor.          benazepril 40 MG  tablet Commonly known as: LOTENSIN Take 1 tablet (40 mg total) by mouth daily.   colchicine 0.6 MG tablet Take twice daily for gout attack What changed: See the new instructions. Changed by: Fransisca Kaufmann Charleene Callegari, MD   famotidine 20 MG tablet Commonly known as: PEPCID Take 1 tablet (20 mg total) by mouth 2 (two) times daily.   Febuxostat 80 MG Tabs Take 1 tablet (80 mg total) by mouth daily.   fluticasone 50 MCG/ACT nasal spray Commonly known as: FLONASE Place into both nostrils as needed for allergies or rhinitis.   gabapentin 100 MG capsule Commonly known as: NEURONTIN Take 1 capsule (100 mg total) by mouth at bedtime.   omeprazole 40 MG capsule Commonly known as: PRILOSEC Take 40 mg by mouth every morning.   polyethylene glycol 17 g packet Commonly known as: MIRALAX / GLYCOLAX Take 17 g by mouth daily.   rosuvastatin 20 MG tablet Commonly known as: CRESTOR Take 1 tablet (20 mg total) by mouth daily.   sildenafil 20 MG tablet Commonly known as: REVATIO Take 1-5 tablets (20-100 mg total) by mouth daily as needed. What changed: See the new instructions. Changed by: Fransisca Kaufmann Zonia Caplin, MD   tamsulosin 0.4 MG Caps capsule Commonly known as: FLOMAX Take 1 capsule (0.4 mg total) by mouth daily.   Testosterone 20.25 MG/ACT (1.62%) Gel Apply 2 Pump topically daily.   testosterone cypionate 100 MG/ML injection Commonly known as:  DEPOTESTOTERONE CYPIONATE Inject into the muscle every 14 (fourteen) days. For IM use only         Objective:   BP 113/81   Pulse 73   Temp (!) 97.4 F (36.3 C)   Ht 5' 10"  (1.778 m)   Wt 195 lb (88.5 kg)   SpO2 98%   BMI 27.98 kg/m   Wt Readings from Last 3 Encounters:  01/17/22 195 lb (88.5 kg)  10/05/21 213 lb (96.6 kg)  08/25/21 219 lb (99.3 kg)    Physical Exam Vitals and nursing note reviewed.  Constitutional:      General: He is not in acute distress.    Appearance: He is well-developed. He is not diaphoretic.  Eyes:      General: No scleral icterus.    Conjunctiva/sclera: Conjunctivae normal.  Neck:     Thyroid: No thyromegaly.  Cardiovascular:     Rate and Rhythm: Normal rate and regular rhythm.     Heart sounds: Normal heart sounds. No murmur heard. Pulmonary:     Effort: Pulmonary effort is normal. No respiratory distress.     Breath sounds: Normal breath sounds. No wheezing.  Musculoskeletal:        General: No swelling. Normal range of motion.     Cervical back: Neck supple.  Lymphadenopathy:     Cervical: No cervical adenopathy.  Skin:    General: Skin is warm and dry.     Findings: No rash.  Neurological:     Mental Status: He is alert and oriented to person, place, and time.     Coordination: Coordination normal.  Psychiatric:        Behavior: Behavior normal.       Assessment & Plan:   Problem List Items Addressed This Visit       Cardiovascular and Mediastinum   Hypertension - Primary   Relevant Medications   benazepril (LOTENSIN) 40 MG tablet   sildenafil (REVATIO) 20 MG tablet   Other Relevant Orders   CBC with Differential/Platelet   CMP14+EGFR   Lipid panel   Bayer DCA Hb A1c Waived     Other   Gout   Hyperlipidemia with target LDL less than 100   Relevant Medications   benazepril (LOTENSIN) 40 MG tablet   sildenafil (REVATIO) 20 MG tablet   Other Relevant Orders   CBC with Differential/Platelet   CMP14+EGFR   Lipid panel   Bayer DCA Hb A1c Waived   Other Visit Diagnoses     Stage 3b chronic kidney disease (HCC)       Relevant Orders   CBC with Differential/Platelet   CMP14+EGFR   Lipid panel   Bayer DCA Hb A1c Waived   Epigastric pain       Relevant Medications   famotidine (PEPCID) 20 MG tablet   Essential hypertension       Relevant Medications   benazepril (LOTENSIN) 40 MG tablet   sildenafil (REVATIO) 20 MG tablet   Testosterone deficiency       Relevant Medications   Testosterone 20.25 MG/ACT (1.62%) GEL       We will check blood  work today.  He seems to be doing okay with everything except for still having stomach issues.  He is going to go back and see GI for it.  He says everything else is going well. Follow up plan: Return in about 6 months (around 07/19/2022), or if symptoms worsen or fail to improve, for Hypertension and hyperlipidemia and gout.  Counseling  provided for all of the vaccine components Orders Placed This Encounter  Procedures   CBC with Differential/Platelet   CMP14+EGFR   Lipid panel   Bayer DCA Hb A1c Waived    [name], MD [facility] 01/17/2022, 2:59 PM

## 2022-01-18 LAB — CBC WITH DIFFERENTIAL/PLATELET
Basophils Absolute: 0 10*3/uL (ref 0.0–0.2)
Basos: 0 %
EOS (ABSOLUTE): 0.1 10*3/uL (ref 0.0–0.4)
Eos: 1 %
Hematocrit: 50.3 % (ref 37.5–51.0)
Hemoglobin: 16.3 g/dL (ref 13.0–17.7)
Immature Grans (Abs): 0 10*3/uL (ref 0.0–0.1)
Immature Granulocytes: 0 %
Lymphocytes Absolute: 2 10*3/uL (ref 0.7–3.1)
Lymphs: 32 %
MCH: 28.3 pg (ref 26.6–33.0)
MCHC: 32.4 g/dL (ref 31.5–35.7)
MCV: 87 fL (ref 79–97)
Monocytes Absolute: 0.4 10*3/uL (ref 0.1–0.9)
Monocytes: 6 %
Neutrophils Absolute: 4 10*3/uL (ref 1.4–7.0)
Neutrophils: 61 %
Platelets: 224 10*3/uL (ref 150–450)
RBC: 5.76 x10E6/uL (ref 4.14–5.80)
RDW: 12.4 % (ref 11.6–15.4)
WBC: 6.4 10*3/uL (ref 3.4–10.8)

## 2022-01-18 LAB — CMP14+EGFR
ALT: 18 IU/L (ref 0–44)
AST: 23 IU/L (ref 0–40)
Albumin/Globulin Ratio: 2 (ref 1.2–2.2)
Albumin: 4.5 g/dL (ref 3.9–4.9)
Alkaline Phosphatase: 58 IU/L (ref 44–121)
BUN/Creatinine Ratio: 13 (ref 10–24)
BUN: 21 mg/dL (ref 8–27)
Bilirubin Total: 0.5 mg/dL (ref 0.0–1.2)
CO2: 23 mmol/L (ref 20–29)
Calcium: 9.8 mg/dL (ref 8.6–10.2)
Chloride: 105 mmol/L (ref 96–106)
Creatinine, Ser: 1.65 mg/dL — ABNORMAL HIGH (ref 0.76–1.27)
Globulin, Total: 2.2 g/dL (ref 1.5–4.5)
Glucose: 84 mg/dL (ref 70–99)
Potassium: 5.1 mmol/L (ref 3.5–5.2)
Sodium: 140 mmol/L (ref 134–144)
Total Protein: 6.7 g/dL (ref 6.0–8.5)
eGFR: 46 mL/min/{1.73_m2} — ABNORMAL LOW (ref >59)

## 2022-01-18 LAB — LIPID PANEL
Chol/HDL Ratio: 3.4 ratio (ref 0.0–5.0)
Cholesterol, Total: 142 mg/dL (ref 100–199)
HDL: 42 mg/dL (ref >39)
LDL Chol Calc (NIH): 77 mg/dL (ref 0–99)
Triglycerides: 132 mg/dL (ref 0–149)
VLDL Cholesterol Cal: 23 mg/dL (ref 5–40)

## 2022-01-20 ENCOUNTER — Encounter: Payer: Self-pay | Admitting: Family Medicine

## 2022-01-21 MED ORDER — SILDENAFIL CITRATE 20 MG PO TABS: 20.0000 mg | ORAL_TABLET | Freq: Every day | ORAL | 5 refills | Status: DC | PRN

## 2022-03-26 ENCOUNTER — Other Ambulatory Visit: Payer: Self-pay | Admitting: Family Medicine

## 2022-03-26 DIAGNOSIS — E349 Endocrine disorder, unspecified: Secondary | ICD-10-CM

## 2022-05-01 ENCOUNTER — Other Ambulatory Visit: Payer: Self-pay | Admitting: Family Medicine

## 2022-05-01 DIAGNOSIS — E349 Endocrine disorder, unspecified: Secondary | ICD-10-CM

## 2022-05-03 ENCOUNTER — Telehealth: Payer: Self-pay

## 2022-05-03 NOTE — Telephone Encounter (Signed)
Key: [id number]   Your information has been submitted to [facility]. [employer] will review the request and will issue a decision, typically within 3-7 days from your submission. You can check the updated outcome later by reopening this request.  If [employer] has not responded in 3-7 days or if you have any questions about your ePA request, please contact [employer] at [phone]. If you think there may be a problem with your PA request, use our live chat feature at the bottom right.

## 2022-05-17 NOTE — Telephone Encounter (Signed)
The topical testosterone has been denied.  Pt has been informed. He states that he did receive a letter about the denial and will drop the letter off today. Pt states that it has been appealed and approved in the past.

## 2022-05-18 ENCOUNTER — Telehealth: Payer: Self-pay

## 2022-05-18 NOTE — Telephone Encounter (Signed)
Appeal sent through [facility] website at [email or web address]  Faxed  letter showing that pt authorized our office to initiate appeal to [phone].  Per Website appeal decision should be completed by 05/25/21.  Testosterone 1.62 gel 75g

## 2022-05-19 NOTE — Telephone Encounter (Signed)
Received fax from [facility] requesting more information to have Testosterone gel approved.   Faxed [employer] pts testosterone labs  Fax [phone]

## 2022-05-21 ENCOUNTER — Other Ambulatory Visit: Payer: Self-pay

## 2022-05-21 ENCOUNTER — Encounter (HOSPITAL_BASED_OUTPATIENT_CLINIC_OR_DEPARTMENT_OTHER): Payer: Self-pay | Admitting: Emergency Medicine

## 2022-05-21 ENCOUNTER — Emergency Department (HOSPITAL_BASED_OUTPATIENT_CLINIC_OR_DEPARTMENT_OTHER)
Admission: EM | Admit: 2022-05-21 | Discharge: 2022-05-21 | Disposition: A | Discharge status: Home / Self Care | Payer: Medicare PPO | Attending: Emergency Medicine | Admitting: Emergency Medicine

## 2022-05-21 DIAGNOSIS — R42 Dizziness and giddiness: Secondary | ICD-10-CM | POA: Diagnosis not present

## 2022-05-21 DIAGNOSIS — I129 Hypertensive chronic kidney disease with stage 1 through stage 4 chronic kidney disease, or unspecified chronic kidney disease: Secondary | ICD-10-CM | POA: Insufficient documentation

## 2022-05-21 DIAGNOSIS — R55 Syncope and collapse: Secondary | ICD-10-CM | POA: Insufficient documentation

## 2022-05-21 DIAGNOSIS — N183 Chronic kidney disease, stage 3 unspecified: Secondary | ICD-10-CM | POA: Diagnosis not present

## 2022-05-21 LAB — CBG MONITORING, ED: Glucose-Capillary: 93 mg/dL (ref 70–99)

## 2022-05-21 LAB — URINALYSIS, ROUTINE W REFLEX MICROSCOPIC
Bilirubin Urine: NEGATIVE
Glucose, UA: NEGATIVE mg/dL
Hgb urine dipstick: NEGATIVE
Ketones, ur: NEGATIVE mg/dL
Leukocytes,Ua: NEGATIVE
Nitrite: NEGATIVE
Protein, ur: NEGATIVE mg/dL
Specific Gravity, Urine: 1.005 (ref 1.005–1.030)
pH: 6.5 (ref 5.0–8.0)

## 2022-05-21 LAB — CBC
HCT: 49.3 % (ref 39.0–52.0)
Hemoglobin: 16.7 g/dL (ref 13.0–17.0)
MCH: 30.2 pg (ref 26.0–34.0)
MCHC: 33.9 g/dL (ref 30.0–36.0)
MCV: 89.2 fL (ref 80.0–100.0)
Platelets: 232 10*3/uL (ref 150–400)
RBC: 5.53 MIL/uL (ref 4.22–5.81)
RDW: 12.2 % (ref 11.5–15.5)
WBC: 7.3 10*3/uL (ref 4.0–10.5)
nRBC: 0 % (ref 0.0–0.2)

## 2022-05-21 LAB — BASIC METABOLIC PANEL
Anion gap: 8 (ref 5–15)
BUN: 28 mg/dL — ABNORMAL HIGH (ref 8–23)
CO2: 28 mmol/L (ref 22–32)
Calcium: 9.8 mg/dL (ref 8.9–10.3)
Chloride: 101 mmol/L (ref 98–111)
Creatinine, Ser: 1.56 mg/dL — ABNORMAL HIGH (ref 0.61–1.24)
GFR, Estimated: 49 mL/min — ABNORMAL LOW (ref >60)
Glucose, Bld: 99 mg/dL (ref 70–99)
Potassium: 4.7 mmol/L (ref 3.5–5.1)
Sodium: 137 mmol/L (ref 135–145)

## 2022-05-21 LAB — TROPONIN I (HIGH SENSITIVITY)
Troponin I (High Sensitivity): 9 ng/L (ref <18)
Troponin I (High Sensitivity): 12 ng/L (ref <18)

## 2022-05-21 MED ORDER — SODIUM CHLORIDE 0.9 % IV BOLUS
1000.0000 mL | Freq: Once | INTRAVENOUS | Status: AC
  Administered 2022-05-21: 1000 mL via INTRAVENOUS

## 2022-05-21 NOTE — ED Triage Notes (Signed)
  Patient comes in with syncopal episode that occurred about 2 hours ago.  Patient states he stood up and felt dizzy, hit the ground and wife states he was only unconscious for a few seconds.  Patient states he had another episode like this a few weeks ago and everything was fine.  Patient states he has been dieting and lost 40+ lbs recently and is on blood pressure medication that has not been adjusted since weight loss.  No injury from fall.  No pain at this time.

## 2022-05-21 NOTE — Discharge Instructions (Signed)
I would discontinue your blood pressure medication for the next week, but continue to take your blood pressure to ensure that it is not significantly elevating after stopping your blood pressure medication.  Please follow-up with your PCP to ensure that they are comfortable with this decision and the blood pressure readings you are getting at home.  If you have any dizziness, or feeling like you are going to pass out for actual syncopal episodes please return to the emergency department, at that time we will likely recommend admission for further syncope workup given your multiple episodes.

## 2022-05-21 NOTE — ED Provider Notes (Signed)
[facility] Provider Note   CSN: [id number] Arrival date & time: 05/21/22  1841     History {Add pertinent medical, surgical, social history, OB history to HPI:1} Chief Complaint  Patient presents with  . Loss of Consciousness    Matthew [name] is a 67 y.o. male With past medical history significant for hypertension, hyperlipidemia, obesity, who endorses 40 pound weight loss intentionally over the last 11 months through weight watchers, who presents with concern for syncopal episode tonight.  Patient reports that he was standing up in the kitchen, began to feel lightheaded and fell to the ground.  He denies hitting his head, and wife reports that he regained consciousness shortly thereafter.  This is a second syncopal episode in 2 weeks, he denies any chest pain, feeling of heart racing prior to syncope.  He reports systolic blood pressure in the 90s, pulse rate in the 140s directly after his syncopal episode, his vital signs are stable on arrival.  His blood pressure medication has not been adjusted since his 40 pound weight loss, he has not been evaluated for his need to continue to be on the blood pressure medication.  He reports that he has had plenty to eat and drink today, he does not feel dehydrated.  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   Loss of Consciousness      Home Medications Prior to Admission medications   Medication Sig Start Date End Date Taking? Authorizing Provider  benazepril (LOTENSIN) 40 MG tablet Take 1 tablet (40 mg total) by mouth daily. 01/17/22   Dettinger, Fransisca Kaufmann, MD  colchicine 0.6 MG tablet Take twice daily for gout attack 01/17/22   Dettinger, Fransisca Kaufmann, MD  famotidine (PEPCID) 20 MG tablet Take 1 tablet (20 mg total) by mouth 2 (two) times daily. 01/17/22   Dettinger, Fransisca Kaufmann, MD  Febuxostat 80 MG  TABS Take 1 tablet (80 mg total) by mouth daily. 01/17/22   Dettinger, Fransisca Kaufmann, MD  fluticasone (FLONASE) 50 MCG/ACT nasal spray Place into both nostrils as needed for allergies or rhinitis.    [provider]  gabapentin (NEURONTIN) 100 MG capsule Take 1 capsule (100 mg total) by mouth at bedtime. 10/05/21   Lyndal Pulley, DO  omeprazole (PRILOSEC) 40 MG capsule Take 40 mg by mouth every morning. 10/12/19   [provider]  polyethylene glycol (MIRALAX / GLYCOLAX) packet Take 17 g by mouth daily.    [provider]  rosuvastatin (CRESTOR) 20 MG tablet Take 1 tablet (20 mg total) by mouth daily. 06/11/21   Dettinger, Fransisca Kaufmann, MD  sildenafil (REVATIO) 20 MG tablet Take 1-5 tablets (20-100 mg total) by mouth daily as needed. 01/21/22   Dettinger, Fransisca Kaufmann, MD  tamsulosin (FLOMAX) 0.4 MG CAPS capsule Take 1 capsule (0.4 mg total) by mouth daily. 01/17/22   Dettinger, Fransisca Kaufmann, MD  Testosterone 1.62 % GEL APPLY 2 PUMP TOPICALLY DAILY. 05/02/22   Dettinger, Fransisca Kaufmann, MD  testosterone cypionate (DEPOTESTOTERONE CYPIONATE) 100 MG/ML injection Inject into the muscle every 14 (fourteen) days. For IM use only    [provider]      Allergies    Patient has no known allergies.    Review of Systems   Review of Systems  Cardiovascular:  Positive for syncope.    Physical Exam Updated Vital Signs BP 114/86  Pulse 74   Temp 98 F (36.7 C) (Oral)   Resp 15   Ht 5' 10"$  (1.778 m)   Wt 86.2 kg   SpO2 99%   BMI 27.26 kg/m  Physical Exam  ED Results / Procedures / Treatments   Labs (all labs ordered are listed, but only abnormal results are displayed) Labs Reviewed  BASIC METABOLIC PANEL - Abnormal; Notable for the following components:      Result Value   BUN 28 (*)    Creatinine, Ser 1.56 (*)    GFR, Estimated 49 (*)    All other components within normal limits  URINALYSIS, ROUTINE W REFLEX MICROSCOPIC - Abnormal; Notable for the following components:    Color, Urine COLORLESS (*)    All other components within normal limits  CBC  CBG MONITORING, ED  TROPONIN I (HIGH SENSITIVITY)  TROPONIN I (HIGH SENSITIVITY)    EKG None  Radiology No results found.  Procedures Procedures  {Document cardiac monitor, telemetry assessment procedure when appropriate:1}  Medications Ordered in ED Medications  sodium chloride 0.9 % bolus 1,000 mL (has no administration in time range)    ED Course/ Medical Decision Making/ A&P Clinical Course as of 05/21/22 2207  Sat May 21, 2022  2207 Fluid bolus with discharge [CP]    Clinical Course User Index [CP] Carlus Pavlov, Charlestine Rookstool H, PA-C   {   Click here for ABCD2, HEART and other calculatorsREFRESH Note before signing :1}                          Medical Decision Making Amount and/or Complexity of Data Reviewed Labs: ordered.   ***  {Document critical care time when appropriate:1} {Document review of labs and clinical decision tools ie heart score, Chads2Vasc2 etc:1}  {Document your independent review of radiology images, and any outside records:1} {Document your discussion with family members, caretakers, and with consultants:1} {Document social determinants of health affecting pt's care:1} {Document your decision making why or why not admission, treatments were needed:1} Final Clinical Impression(s) / ED Diagnoses Final diagnoses:  None    Rx / DC Orders ED Discharge Orders     None

## 2022-05-22 ENCOUNTER — Encounter: Payer: Self-pay | Admitting: Family Medicine

## 2022-06-01 ENCOUNTER — Other Ambulatory Visit: Payer: Self-pay | Admitting: Family Medicine

## 2022-06-13 ENCOUNTER — Other Ambulatory Visit: Payer: Self-pay

## 2022-06-13 ENCOUNTER — Telehealth: Payer: Medicare PPO | Admitting: Family Medicine

## 2022-06-13 ENCOUNTER — Encounter: Payer: Self-pay | Admitting: Family Medicine

## 2022-06-13 DIAGNOSIS — R509 Fever, unspecified: Secondary | ICD-10-CM | POA: Diagnosis not present

## 2022-06-13 DIAGNOSIS — J989 Respiratory disorder, unspecified: Secondary | ICD-10-CM

## 2022-06-13 LAB — VERITOR FLU A/B WAIVED
Influenza A: NEGATIVE
Influenza B: NEGATIVE

## 2022-06-13 LAB — RSV AG, IMMUNOCHR, WAIVED: RSV Ag, Immunochr, Waived: NEGATIVE

## 2022-06-13 NOTE — Progress Notes (Signed)
Virtual Visit via mychart video Note  I connected with Matthew [name] on 06/13/22 at 1415 by video and verified that I am speaking with the correct person using two identifiers. Matthew [name] is currently located at home and patient are currently with her during visit. The provider, Fransisca Kaufmann Jazyah Butsch, MD is located in their office at time of visit.  Call ended at 1427  I discussed the limitations, risks, security and privacy concerns of performing an evaluation and management service by video and the availability of in person appointments. I also discussed with the patient that there may be a patient responsible charge related to this service. The patient expressed understanding and agreed to proceed.  Temp 97.9 History and Present Illness: Patient is calling in for fever over the past 2 days.  He feels like he lost sense of smell and taste.  Yesterday he had a fever of 104.1.  he has sore throat and body aches and not feeling well.  He did a home test but it was expired.  He denies SOB or wheezing. He had family had covid 2 weeks ago. He has not had sick contacts since that time.  He feels some better today. He took tylenol and fever has been better today.  He is staying hydrated.   1. Respiratory illness with fever     Outpatient Encounter Medications as of 06/13/2022  Medication Sig   benazepril (LOTENSIN) 40 MG tablet Take 1 tablet (40 mg total) by mouth daily.   colchicine 0.6 MG tablet Take twice daily for gout attack   famotidine (PEPCID) 20 MG tablet Take 1 tablet (20 mg total) by mouth 2 (two) times daily.   Febuxostat 80 MG TABS Take 1 tablet (80 mg total) by mouth daily.   fluticasone (FLONASE) 50 MCG/ACT nasal spray Place into both nostrils as needed for allergies or rhinitis.   gabapentin (NEURONTIN) 100 MG capsule Take 1 capsule (100 mg total) by mouth at bedtime.   omeprazole (PRILOSEC) 40 MG capsule Take 40 mg by mouth every morning.   polyethylene glycol (MIRALAX /  GLYCOLAX) packet Take 17 g by mouth daily.   rosuvastatin (CRESTOR) 20 MG tablet TAKE 1 TABLET BY MOUTH EVERY DAY   sildenafil (REVATIO) 20 MG tablet Take 1-5 tablets (20-100 mg total) by mouth daily as needed.   tamsulosin (FLOMAX) 0.4 MG CAPS capsule Take 1 capsule (0.4 mg total) by mouth daily.   Testosterone 1.62 % GEL APPLY 2 PUMP TOPICALLY DAILY.   testosterone cypionate (DEPOTESTOTERONE CYPIONATE) 100 MG/ML injection Inject into the muscle every 14 (fourteen) days. For IM use only   No facility-administered encounter medications on file as of 06/13/2022.    Review of Systems  Constitutional:  Positive for fever. Negative for chills.  HENT:  Positive for congestion, postnasal drip, rhinorrhea, sinus pressure, sneezing and sore throat. Negative for ear discharge, ear pain and voice change.   Eyes:  Negative for pain, discharge, redness and visual disturbance.  Respiratory:  Negative for shortness of breath and wheezing.   Cardiovascular:  Negative for chest pain and leg swelling.  Musculoskeletal:  Negative for gait problem.  Skin:  Negative for rash.  All other systems reviewed and are negative.   Observations/Objective: Patient sounds comfortable and in no acute distress  Assessment and Plan: Problem List Items Addressed This Visit   None Visit Diagnoses     Respiratory illness with fever    -  Primary   Relevant Orders  Novel Coronavirus, NAA ([facility])   Veritor Flu A/B Waived   RSV Ag, Immunochr, Waived        Follow up plan: Return if symptoms worsen or fail to improve.     I discussed the assessment and treatment plan with the patient. The patient was provided an opportunity to ask questions and all were answered. The patient agreed with the plan and demonstrated an understanding of the instructions.   The patient was advised to call back or seek an in-person evaluation if the symptoms worsen or if the condition fails to improve as anticipated.  The above  assessment and management plan was discussed with the patient. The patient verbalized understanding of and has agreed to the management plan. Patient is aware to call the clinic if symptoms persist or worsen. Patient is aware when to return to the clinic for a follow-up visit. Patient educated on when it is appropriate to go to the emergency department.    I provided 12 minutes of non-face-to-face time during this encounter.    Worthy Rancher, MD

## 2022-06-14 LAB — NOVEL CORONAVIRUS, NAA: SARS-CoV-2, NAA: NOT DETECTED

## 2022-06-27 ENCOUNTER — Ambulatory Visit: Payer: Medicare PPO | Admitting: Urology

## 2022-06-27 VITALS — BP 130/75 | HR 80 | Ht 70.0 in | Wt 190.0 lb

## 2022-06-27 DIAGNOSIS — E291 Testicular hypofunction: Secondary | ICD-10-CM

## 2022-06-27 DIAGNOSIS — N401 Enlarged prostate with lower urinary tract symptoms: Secondary | ICD-10-CM | POA: Diagnosis not present

## 2022-06-27 DIAGNOSIS — R339 Retention of urine, unspecified: Secondary | ICD-10-CM | POA: Diagnosis not present

## 2022-06-27 DIAGNOSIS — N5201 Erectile dysfunction due to arterial insufficiency: Secondary | ICD-10-CM

## 2022-06-27 DIAGNOSIS — N138 Other obstructive and reflux uropathy: Secondary | ICD-10-CM

## 2022-06-27 DIAGNOSIS — R3129 Other microscopic hematuria: Secondary | ICD-10-CM

## 2022-06-27 DIAGNOSIS — R7989 Other specified abnormal findings of blood chemistry: Secondary | ICD-10-CM

## 2022-06-27 LAB — URINALYSIS, ROUTINE W REFLEX MICROSCOPIC
Bilirubin, UA: NEGATIVE
Glucose, UA: NEGATIVE
Ketones, UA: NEGATIVE
Leukocytes,UA: NEGATIVE
Nitrite, UA: NEGATIVE
Protein,UA: NEGATIVE
Specific Gravity, UA: 1.015 (ref 1.005–1.030)
Urobilinogen, Ur: 0.2 mg/dL (ref 0.2–1.0)
pH, UA: 7 (ref 5.0–7.5)

## 2022-06-27 LAB — MICROSCOPIC EXAMINATION: Bacteria, UA: NONE SEEN

## 2022-06-27 MED ORDER — TAMSULOSIN HCL 0.4 MG PO CAPS: 0.4000 mg | ORAL_CAPSULE | Freq: Two times a day (BID) | ORAL | 3 refills | Status: DC

## 2022-06-27 NOTE — Progress Notes (Unsigned)
06/27/2022 9:49 AM   Sheffield Slider 1956-01-07 [id number]  Referring provider: Dettinger, Fransisca Kaufmann, MD [place],  [place] [address]  No chief complaint on file.   HPI:  New patient for current health urology [place], saw him at [facility] urology in [place]-  1) BPH with incomplete bladder emptying-patient presented with urinary retention and bilateral hydronephrosis in 2019.  His prostate was 42 g.  He underwent thulium laser vaporization of the prostate in 2019.  He had increased urinary symptoms in 2022.  His post void was 700 and then 400.  Ultrasound showed no hydronephrosis.  A Apr 2023 CT was benign with no hydro. A May 2023 cystoscopy revealed some regrowth of the prostate and collapse of the channel voiding through a small slitlike aperture.  We consider TURP but he canceled that.  Tamsulosin increased to twice daily.   His August 2023 PSA was 0.5.  PSA has been as high as 2.  2) low testosterone-10+ year history.  His prolactin and hematocrit were normal.  He tried T-Gel and Testopel in the past.  His August 2023 testosterone was 890 on T-gel with Dr. Warrick Parisian.  October 2023 hematocrit 50.3.  3) ED-for several years.  Patient tried tadalafil 20 mg.  Patient underwent low intensity shockwave therapy.  Today, seen for the above. AUASS = 4. Feb 2024 PSA 1.56. He is voiding with a good stream. Noc x 1. He felt like LISWT helped. Needs tadalafil refill and might get that at [facility].    He has no gross hematuria. UA today with 3-10 rbc.    PMH: Past Medical History:  Diagnosis Date   Allergic rhinitis    BPH (benign prostatic hyperplasia)    Chronic constipation    ED (erectile dysfunction)    GERD (gastroesophageal reflux disease)    Gout    Heme positive stool    history of, normal colonoscopy   History of sleep apnea    corrected after T&A procedure   Hyperlipidemia    Hypertension    Microscopic hematuria    Nocturia    Urinary catheter in place     2018 x one month    Surgical History: Past Surgical History:  Procedure Laterality Date   COLONOSCOPY  06/2017   laser vaporization of prostate     SHOULDER SURGERY Right    THULIUM LASER TURP (TRANSURETHRAL RESECTION OF PROSTATE) N/A 12/26/2017   Procedure: THULIUM LASER TURP (TRANSURETHRAL RESECTION OF PROSTATE);  Surgeon: Festus Aloe, MD;  Location: [facility];  Service: Urology;  Laterality: N/A;   TONSILLECTOMY AND ADENOIDECTOMY  2009   UPPER GI ENDOSCOPY      Home Medications:  Allergies as of 06/27/2022   No Known Allergies      Medication List        Accurate as of June 27, 2022  9:49 AM. If you have any questions, ask your nurse or doctor.          benazepril 40 MG tablet Commonly known as: LOTENSIN Take 1 tablet (40 mg total) by mouth daily.   colchicine 0.6 MG tablet Take twice daily for gout attack   famotidine 20 MG tablet Commonly known as: PEPCID Take 1 tablet (20 mg total) by mouth 2 (two) times daily.   Febuxostat 80 MG Tabs Take 1 tablet (80 mg total) by mouth daily.   fluticasone 50 MCG/ACT nasal spray Commonly known as: FLONASE Place into both nostrils as needed for allergies or rhinitis.  gabapentin 100 MG capsule Commonly known as: NEURONTIN Take 1 capsule (100 mg total) by mouth at bedtime.   omeprazole 40 MG capsule Commonly known as: PRILOSEC Take 40 mg by mouth every morning.   polyethylene glycol 17 g packet Commonly known as: MIRALAX / GLYCOLAX Take 17 g by mouth daily.   rosuvastatin 20 MG tablet Commonly known as: CRESTOR TAKE 1 TABLET BY MOUTH EVERY DAY   sildenafil 20 MG tablet Commonly known as: REVATIO Take 1-5 tablets (20-100 mg total) by mouth daily as needed.   tamsulosin 0.4 MG Caps capsule Commonly known as: FLOMAX Take 1 capsule (0.4 mg total) by mouth daily.   Testosterone 1.62 % Gel APPLY 2 PUMP TOPICALLY DAILY.   testosterone cypionate 100 MG/ML injection Commonly known as:  DEPOTESTOTERONE CYPIONATE Inject into the muscle every 14 (fourteen) days. For IM use only        Allergies: No Known Allergies  Family History: Family History  Problem Relation Age of Onset   Cancer Mother    Heart attack Father 27       Died age 2   Prostate cancer Paternal Uncle     Social History:  reports that he has never smoked. He has never used smokeless tobacco. He reports current alcohol use. He reports that he does not use drugs.   Physical Exam: BP 130/75   Pulse 80   Ht 5\' 10"  (1.778 m)   Wt 190 lb (86.2 kg)   BMI 27.26 kg/m   Constitutional:  Alert and oriented, No acute distress. HEENT:  AT, moist mucus membranes.  Trachea midline, no masses. Cardiovascular: No clubbing, cyanosis, or edema. Respiratory: Normal respiratory effort, no increased work of breathing. GI: Abdomen is soft, nontender, nondistended, no abdominal masses GU: No CVA tenderness Skin: No rashes, bruises or suspicious lesions. Neurologic: Grossly intact, no focal deficits, moving all 4 extremities. Psychiatric: Normal mood and affect.  Laboratory Data: Lab Results  Component Value Date   WBC 7.3 05/21/2022   HGB 16.7 05/21/2022   HCT 49.3 05/21/2022   MCV 89.2 05/21/2022   PLT 232 05/21/2022    Lab Results  Component Value Date   CREATININE 1.56 (H) 05/21/2022    Lab Results  Component Value Date   PSA 0.6 07/08/2014   PSA 0.51 02/19/2013   PSA 0.7 11/14/2012    Lab Results  Component Value Date   TESTOSTERONE 496 01/08/2021    Lab Results  Component Value Date   HGBA1C 5.7 (H) 01/17/2022    Urinalysis    Component Value Date/Time   COLORURINE COLORLESS (A) 05/21/2022 2104   APPEARANCEUR CLEAR 05/21/2022 2104   APPEARANCEUR Clear 09/22/2017 1629   LABSPEC 1.005 05/21/2022 2104   PHURINE 6.5 05/21/2022 2104   GLUCOSEU NEGATIVE 05/21/2022 2104   HGBUR NEGATIVE 05/21/2022 2104   BILIRUBINUR NEGATIVE 05/21/2022 2104   BILIRUBINUR Negative 09/22/2017 1629    [place] NEGATIVE 05/21/2022 2104   PROTEINUR NEGATIVE 05/21/2022 2104   NITRITE NEGATIVE 05/21/2022 2104   LEUKOCYTESUR NEGATIVE 05/21/2022 2104    Lab Results  Component Value Date   LABMICR See below: 09/22/2017   WBCUA None seen 09/22/2017   RBCUA 3-10 (A) 09/22/2017   LABEPIT None seen 09/22/2017   BACTERIA None seen 09/22/2017     Assessment & Plan:    1. BPH with obstruction/lower urinary tract symptoms Continue tamsulosin   2. Incomplete bladder emptying Check pvr next time   3. Low testosterone Per PCP   4. ED -  cont tadalafil   5. Microhematuria - cysto and CT benign within the last year. Check renal US.   No follow-ups on file.  Festus Aloe, MD  [facility]  [address] [place],  [address] [phone]

## 2022-06-28 ENCOUNTER — Other Ambulatory Visit: Payer: Medicare PPO

## 2022-07-05 ENCOUNTER — Ambulatory Visit (HOSPITAL_COMMUNITY)
Admission: RE | Admit: 2022-07-05 | Discharge: 2022-07-05 | Disposition: A | Discharge status: Home / Self Care | Payer: Medicare PPO | Source: Ambulatory Visit | Attending: Urology | Admitting: Urology

## 2022-07-05 ENCOUNTER — Telehealth: Payer: Self-pay

## 2022-07-05 DIAGNOSIS — R3129 Other microscopic hematuria: Secondary | ICD-10-CM

## 2022-07-05 MED ORDER — FEBUXOSTAT 80 MG PO TABS: 1.0000 | ORAL_TABLET | Freq: Every day | ORAL | 1 refills | Status: DC

## 2022-07-05 NOTE — Telephone Encounter (Signed)
Received letter from patient stating that his [facility] stating that Febuxostat is not covered by his plan.  Per pt he has been taking this for years. He could not take Allopurinol per Dr. Theador Hawthorne his kidney specialist due to increased iron levels while taking.   Initiated PA in [place] my meds today.  (Key: [id number])  form thumbnail This request has been approved.  Please note any additional information provided by [facility] at the bottom of your screen.  Pts pharmacy made aware via fax.

## 2022-07-05 NOTE — Telephone Encounter (Signed)
Patient was only given a temporary supply of Febuxostat, insurance will not cover.  Covered alternatives are Allopurinol 100 or 300 mg.

## 2022-07-06 NOTE — Telephone Encounter (Signed)
Ok thanks, yes PA needs to be done

## 2022-07-06 NOTE — Telephone Encounter (Signed)
Outcome Approved on March 26 PA Case: [id number], Status: Approved, Coverage Starts on: 04/11/2022 12:00:00 AM, Coverage Ends on: 04/11/2023 12:00:00 AM. Questions? Contact [phone]. Authorization Expiration Date: 04/10/2023   [facility] notified

## 2022-07-20 ENCOUNTER — Ambulatory Visit (INDEPENDENT_AMBULATORY_CARE_PROVIDER_SITE_OTHER): Payer: Medicare PPO | Admitting: Family Medicine

## 2022-07-20 ENCOUNTER — Encounter: Payer: Self-pay | Admitting: Family Medicine

## 2022-07-20 VITALS — BP 114/72 | HR 72 | Ht 70.0 in | Wt 192.0 lb

## 2022-07-20 DIAGNOSIS — Z0001 Encounter for general adult medical examination with abnormal findings: Secondary | ICD-10-CM

## 2022-07-20 DIAGNOSIS — I10 Essential (primary) hypertension: Secondary | ICD-10-CM | POA: Diagnosis not present

## 2022-07-20 DIAGNOSIS — E785 Hyperlipidemia, unspecified: Secondary | ICD-10-CM | POA: Diagnosis not present

## 2022-07-20 DIAGNOSIS — N1832 Chronic kidney disease, stage 3b: Secondary | ICD-10-CM | POA: Diagnosis not present

## 2022-07-20 DIAGNOSIS — Z23 Encounter for immunization: Secondary | ICD-10-CM

## 2022-07-20 MED ORDER — BENAZEPRIL HCL 20 MG PO TABS: 20.0000 mg | ORAL_TABLET | Freq: Every day | ORAL | 1 refills | Status: DC

## 2022-07-20 MED ORDER — ROSUVASTATIN CALCIUM 20 MG PO TABS: 20.0000 mg | ORAL_TABLET | Freq: Every day | ORAL | 3 refills | Status: DC

## 2022-07-20 MED ORDER — SILDENAFIL CITRATE 20 MG PO TABS: 20.0000 mg | ORAL_TABLET | Freq: Every day | ORAL | 5 refills | Status: DC | PRN

## 2022-07-20 NOTE — Progress Notes (Signed)
BP 114/72   Pulse 72   Ht 5\' 10"  (1.778 m)   Wt 192 lb (87.1 kg)   SpO2 100%   BMI 27.55 kg/m    Subjective:   Patient ID: Matthew [name], male    DOB: Nov 21, 1955, 67 y.o.   MRN: [id number]  HPI: ALAN BERKENPAS is a 67 y.o. male presenting on 07/20/2022 for Medical Management of Chronic Issues (CPE) and Hypertension (Patient has been losing weight over the past year. About 49m ago pt had fainting and dizzy spells. ER advised pt to cut BP med in half. Pt has been doing this and has done well.)   HPI Annual Exam Patient denies any chest pain, shortness of breath, headaches or vision issues, abdominal complaints, diarrhea, nausea, vomiting, or joint issues.   Hypertension and CKD 3 Patient is currently on benazepril, and their blood pressure today is 114/72. Patient denies any lightheadedness or dizziness. Patient denies headaches, blurred vision, chest pains, shortness of breath, or weakness. Denies any side effects from medication and is content with current medication.  Follows with nephrology and urology for CKD management.  Hyperlipidemia Patient is coming in for recheck of his hyperlipidemia. The patient is currently taking Crestor. They deny any issues with myalgias or history of liver damage from it. They deny any focal numbness or weakness or chest pain.   Relevant past medical, surgical, family and social history reviewed and updated as indicated. Interim medical history since our last visit reviewed. Allergies and medications reviewed and updated.  Review of Systems  Constitutional:  Negative for chills and fever.  HENT:  Negative for ear pain and tinnitus.   Eyes:  Negative for pain and visual disturbance.  Respiratory:  Negative for cough, shortness of breath and wheezing.   Cardiovascular:  Negative for chest pain, palpitations and leg swelling.  Gastrointestinal:  Negative for abdominal pain, blood in stool, constipation and diarrhea.  Genitourinary:  Negative for  dysuria and hematuria.  Musculoskeletal:  Negative for back pain, gait problem and myalgias.  Skin:  Negative for rash.  Neurological:  Negative for dizziness, weakness and headaches.  Psychiatric/Behavioral:  Negative for suicidal ideas.   All other systems reviewed and are negative.   Per HPI unless specifically indicated above   Allergies as of 07/20/2022   No Known Allergies      Medication List        Accurate as of July 20, 2022 10:55 AM. If you have any questions, ask your nurse or doctor.          STOP taking these medications    testosterone cypionate 100 MG/ML injection Commonly known as: DEPOTESTOTERONE CYPIONATE Stopped by: [name] Marleen Moret, MD       TAKE these medications    benazepril 20 MG tablet Commonly known as: LOTENSIN Take 1 tablet (20 mg total) by mouth daily. What changed:  medication strength how much to take Changed by: Nils Pyle, MD   colchicine 0.6 MG tablet Take twice daily for gout attack   famotidine 20 MG tablet Commonly known as: PEPCID Take 1 tablet (20 mg total) by mouth 2 (two) times daily.   Febuxostat 80 MG Tabs Take 1 tablet (80 mg total) by mouth daily.   fluticasone 50 MCG/ACT nasal spray Commonly known as: FLONASE Place into both nostrils as needed for allergies or rhinitis.   gabapentin 100 MG capsule Commonly known as: NEURONTIN Take 1 capsule (100 mg total) by mouth at bedtime.   omeprazole  40 MG capsule Commonly known as: PRILOSEC Take 40 mg by mouth every morning.   polyethylene glycol 17 g packet Commonly known as: MIRALAX / GLYCOLAX Take 17 g by mouth daily.   rosuvastatin 20 MG tablet Commonly known as: CRESTOR Take 1 tablet (20 mg total) by mouth daily.   sildenafil 20 MG tablet Commonly known as: REVATIO Take 1-5 tablets (20-100 mg total) by mouth daily as needed.   tamsulosin 0.4 MG Caps capsule Commonly known as: FLOMAX Take 1 capsule (0.4 mg total) by mouth 2 (two) times  daily.   Testosterone 1.62 % Gel APPLY 2 PUMP TOPICALLY DAILY.         Objective:   BP 114/72   Pulse 72   Ht 5\' 10"  (1.778 m)   Wt 192 lb (87.1 kg)   SpO2 100%   BMI 27.55 kg/m   Wt Readings from Last 3 Encounters:  07/20/22 192 lb (87.1 kg)  06/27/22 190 lb (86.2 kg)  05/21/22 190 lb (86.2 kg)    Physical Exam Vitals reviewed.  Constitutional:      General: He is not in acute distress.    Appearance: He is well-developed. He is not diaphoretic.  HENT:     Right Ear: External ear normal.     Left Ear: External ear normal.     Nose: Nose normal.     Mouth/Throat:     Pharynx: No oropharyngeal exudate.  Eyes:     General: No scleral icterus.    Conjunctiva/sclera: Conjunctivae normal.  Neck:     Thyroid: No thyromegaly.  Cardiovascular:     Rate and Rhythm: Normal rate and regular rhythm.     Heart sounds: Normal heart sounds. No murmur heard. Pulmonary:     Effort: Pulmonary effort is normal. No respiratory distress.     Breath sounds: Normal breath sounds. No wheezing.  Abdominal:     General: Bowel sounds are normal. There is no distension.     Palpations: Abdomen is soft.     Tenderness: There is no abdominal tenderness. There is no guarding or rebound.  Musculoskeletal:        General: No swelling. Normal range of motion.     Cervical back: Neck supple.  Lymphadenopathy:     Cervical: No cervical adenopathy.  Skin:    General: Skin is warm and dry.     Findings: No rash.  Neurological:     Mental Status: He is alert and oriented to person, place, and time.     Coordination: Coordination normal.  Psychiatric:        Behavior: Behavior normal.       Assessment & Plan:   Problem List Items Addressed This Visit       Cardiovascular and Mediastinum   Hypertension - Primary   Relevant Medications   rosuvastatin (CRESTOR) 20 MG tablet   sildenafil (REVATIO) 20 MG tablet   benazepril (LOTENSIN) 20 MG tablet     Genitourinary   Stage 3b  chronic kidney disease     Other   Hyperlipidemia with target LDL less than 100   Relevant Medications   rosuvastatin (CRESTOR) 20 MG tablet   sildenafil (REVATIO) 20 MG tablet   benazepril (LOTENSIN) 20 MG tablet   Other Relevant Orders   Lipid panel   Other Visit Diagnoses     Essential hypertension       Relevant Medications   rosuvastatin (CRESTOR) 20 MG tablet   sildenafil (REVATIO) 20 MG tablet  benazepril (LOTENSIN) 20 MG tablet       See urology soon, does follow-up with nephrology and does well with that. Follow up plan: Return in about 6 months (around 01/19/2023), or if symptoms worsen or fail to improve, for Hypertension and hyperlipidemia.  Counseling provided for all of the vaccine components Orders Placed This Encounter  Procedures   Lipid panel    Arville CareJoshua Nelsie Domino, MD [facility] 07/20/2022, 10:55 AM

## 2022-07-20 NOTE — Addendum Note (Signed)
Addended by: Dorene Sorrow on: 07/20/2022 11:20 AM   Modules accepted: Orders

## 2022-07-21 ENCOUNTER — Other Ambulatory Visit: Payer: Self-pay | Admitting: Family Medicine

## 2022-07-21 DIAGNOSIS — E349 Endocrine disorder, unspecified: Secondary | ICD-10-CM

## 2022-07-22 NOTE — Telephone Encounter (Signed)
Last office visit 07/20/22 Last refill 05/02/22, 75 grams, 1 refill

## 2022-08-02 ENCOUNTER — Telehealth: Payer: Self-pay

## 2022-08-02 NOTE — Telephone Encounter (Signed)
Patient called today requesting [place] results done on 03/26.  Can you please review imaging.

## 2022-08-03 NOTE — Telephone Encounter (Signed)
Patient aware of MD response to [place]

## 2022-08-03 NOTE — Telephone Encounter (Signed)
Let him know [place] looks good. He is not emptying his bladder all the way, but it does not look like it is putting a lot of pressure on his kidneys. We will monitor this over time as his left kidney is smaller from his prior episode years ago when he was really full and both kidneys were backed up.

## 2022-10-05 ENCOUNTER — Other Ambulatory Visit: Payer: Self-pay | Admitting: Family Medicine

## 2022-10-05 DIAGNOSIS — E349 Endocrine disorder, unspecified: Secondary | ICD-10-CM

## 2022-10-08 ENCOUNTER — Other Ambulatory Visit: Payer: Self-pay | Admitting: Family Medicine

## 2022-10-08 DIAGNOSIS — R1013 Epigastric pain: Secondary | ICD-10-CM

## 2022-10-20 ENCOUNTER — Other Ambulatory Visit: Payer: Medicare PPO

## 2022-10-20 DIAGNOSIS — N1831 Chronic kidney disease, stage 3a: Secondary | ICD-10-CM | POA: Diagnosis not present

## 2022-10-20 DIAGNOSIS — N189 Chronic kidney disease, unspecified: Secondary | ICD-10-CM | POA: Diagnosis not present

## 2022-10-20 DIAGNOSIS — I129 Hypertensive chronic kidney disease with stage 1 through stage 4 chronic kidney disease, or unspecified chronic kidney disease: Secondary | ICD-10-CM | POA: Diagnosis not present

## 2022-10-20 DIAGNOSIS — R809 Proteinuria, unspecified: Secondary | ICD-10-CM | POA: Diagnosis not present

## 2022-10-31 DIAGNOSIS — R3914 Feeling of incomplete bladder emptying: Secondary | ICD-10-CM | POA: Diagnosis not present

## 2022-10-31 DIAGNOSIS — N401 Enlarged prostate with lower urinary tract symptoms: Secondary | ICD-10-CM | POA: Diagnosis not present

## 2022-10-31 DIAGNOSIS — N1832 Chronic kidney disease, stage 3b: Secondary | ICD-10-CM | POA: Diagnosis not present

## 2022-10-31 DIAGNOSIS — Z5181 Encounter for therapeutic drug level monitoring: Secondary | ICD-10-CM | POA: Diagnosis not present

## 2022-11-29 NOTE — Progress Notes (Unsigned)
[name] [address] [place] [address] Phone: [phone] Subjective:   Matthew [name], am serving as a scribe for Dr. Antoine Primas.  I'm seeing this patient by the request  of:  Dettinger, [name], MD  CC: Left knee pain  [id number]  Matthew [name] is a 67 y.o. male coming in with complaint of L knee pain. Last seen in 2023 for OMT. Three weeks ago he was running with his grandkids and might have pulled something in his knee Patient states that his pain is intermittent. Patient walks 2-3 miles a day typically but is unable to do so because of medial joint pain. Stood for 6 hours yesterday and when he got in car his pain increased. Sleeping on his side also increases his pain. Has tried ice, Voltaren, Biofreeze. Notes having joint pain in multiple joints when he wakes up in morning.       Past Medical History:  Diagnosis Date   Allergic rhinitis    BPH (benign prostatic hyperplasia)    Chronic constipation    ED (erectile dysfunction)    GERD (gastroesophageal reflux disease)    Gout    Heme positive stool    history of, normal colonoscopy   History of sleep apnea    corrected after T&A procedure   Hyperlipidemia    Hypertension    Microscopic hematuria    Nocturia    Urinary catheter in place    2018 x one month   Past Surgical History:  Procedure Laterality Date   COLONOSCOPY  06/2017   laser vaporization of prostate     SHOULDER SURGERY Right    THULIUM LASER TURP (TRANSURETHRAL RESECTION OF PROSTATE) N/A 12/26/2017   Procedure: THULIUM LASER TURP (TRANSURETHRAL RESECTION OF PROSTATE);  Surgeon: Jerilee Field, MD;  Location: [facility];  Service: Urology;  Laterality: N/A;   TONSILLECTOMY AND ADENOIDECTOMY  2009   UPPER GI ENDOSCOPY     Social History   Socioeconomic History   Marital status: Married    Spouse name: Not on file   Number of children: Not on file   Years of education: Not on  file   Highest education level: Not on file  Occupational History   Not on file  Tobacco Use   Smoking status: Never   Smokeless tobacco: Never  Vaping Use   Vaping status: Never Used  Substance and Sexual Activity   Alcohol use: Yes    Comment: 2 per month   Drug use: No   Sexual activity: Not on file  Other Topics Concern   Not on file  Social History Narrative   Not on file   Social Determinants of Health   Financial Resource Strain: Low Risk  (07/30/2022)   Received from [facility], [facility]   Overall Financial Resource Strain (CARDIA)    Difficulty of Paying Living Expenses: Not very hard  Food Insecurity: No Food Insecurity (07/30/2022)   Received from [facility], [facility]   Hunger Vital Sign    Worried About Running Out of Food in the Last Year: Never true    Ran Out of Food in the Last Year: Never true  Transportation Needs: No Transportation Needs (07/30/2022)   Received from [facility], [facility]   PRAPARE - Transportation    Lack of Transportation (Medical): No    Lack of Transportation (Non-Medical): No  Physical Activity: Insufficiently Active (07/30/2022)   Received from [facility], [facility], [facility]  Exercise Vital Sign    Days of Exercise per Week: 3 days    Minutes of Exercise per Session: 30 min  Stress: No Stress Concern Present (07/30/2022)   Received from [facility], [facility] of Occupational Health - Occupational Stress Questionnaire    Feeling of Stress : Only a little  Social Connections: Socially Integrated (07/30/2022)   Received from [facility], [facility]   Social Network    How would you rate your social network (family, work, friends)?: Good participation with social networks   No Known Allergies Family History  Problem Relation Age of Onset   Cancer Mother    Heart attack Father 32       Died age 59   Prostate cancer Paternal Uncle     Current Outpatient Medications (Endocrine &  Metabolic):    Testosterone 20.25 MG/ACT (1.62%) GEL, APPLY 2 PUMP TOPICALLY DAILY.  Current Outpatient Medications (Cardiovascular):    benazepril (LOTENSIN) 20 MG tablet, Take 1 tablet (20 mg total) by mouth daily.   rosuvastatin (CRESTOR) 20 MG tablet, Take 1 tablet (20 mg total) by mouth daily.   sildenafil (REVATIO) 20 MG tablet, Take 1-5 tablets (20-100 mg total) by mouth daily as needed.  Current Outpatient Medications (Respiratory):    fluticasone (FLONASE) 50 MCG/ACT nasal spray, Place into both nostrils as needed for allergies or rhinitis.  Current Outpatient Medications (Analgesics):    colchicine 0.6 MG tablet, Take twice daily for gout attack   Febuxostat 80 MG TABS, Take 1 tablet (80 mg total) by mouth daily.   Current Outpatient Medications (Other):    famotidine (PEPCID) 20 MG tablet, TAKE 1 TABLET BY MOUTH TWICE A DAY   omeprazole (PRILOSEC) 40 MG capsule, Take 40 mg by mouth every morning.   polyethylene glycol (MIRALAX / GLYCOLAX) packet, Take 17 g by mouth daily.   tamsulosin (FLOMAX) 0.4 MG CAPS capsule, Take 1 capsule (0.4 mg total) by mouth 2 (two) times daily.   Reviewed prior external information including notes and imaging from  primary care provider As well as notes that were available from care everywhere and other healthcare systems.  Past medical history, social, surgical and family history all reviewed in electronic medical record.  No pertanent information unless stated regarding to the chief complaint.   Review of Systems:  No headache, visual changes, nausea, vomiting, diarrhea, constipation, dizziness, abdominal pain, skin rash, fevers, chills, night sweats, weight loss, swollen lymph nodes, body aches, joint swelling, chest pain, shortness of breath, mood changes. POSITIVE muscle aches  Objective  Blood pressure (!) 128/98, pulse 67, height 5\' 10"  (1.778 m), weight 201 lb (91.2 kg), SpO2 98%.   General: No apparent distress alert and oriented x3  mood and affect normal, dressed appropriately.  HEENT: Pupils equal, extraocular movements intact  Respiratory: Patient's speak in full sentences and does not appear short of breath  Cardiovascular: No lower extremity edema, non tender, no erythema  Left knee exam shows patient does have swelling noted of the knee.  Lacks last 5 degrees of extension noted.  Tender to palpation over the medial joint line.  Positive McMurray's noted.   Limited muscular skeletal ultrasound was performed and interpreted by Antoine Primas, M  Limited ultrasound shows patient does have what appears to be an acute meniscal tear noted.  Seems to be medial aspect and goes greater than 50% of the tendon.  In addition to this does have a reactive synovitis and hypoechoic changes in the  patellofemoral joint significant with effusion. Impression: Acute medial meniscal tear with reactive effusion of the knee  After informed written and verbal consent, patient was seated on exam table. Left knee was prepped with alcohol swab and utilizing anterolateral approach, patient's left knee space was injected with 4:1  marcaine 0.5%: Kenalog 40mg /dL. Patient tolerated the procedure well without immediate complications.    Impression and Recommendations:    The above documentation has been reviewed and is accurate and complete Judi Saa, DO

## 2022-12-01 ENCOUNTER — Encounter: Payer: Self-pay | Admitting: Family Medicine

## 2022-12-01 ENCOUNTER — Ambulatory Visit: Payer: Self-pay

## 2022-12-01 ENCOUNTER — Ambulatory Visit (INDEPENDENT_AMBULATORY_CARE_PROVIDER_SITE_OTHER): Payer: Medicare PPO

## 2022-12-01 ENCOUNTER — Ambulatory Visit: Payer: Medicare PPO | Admitting: Family Medicine

## 2022-12-01 VITALS — BP 128/98 | HR 67 | Ht 70.0 in | Wt 201.0 lb

## 2022-12-01 DIAGNOSIS — G8929 Other chronic pain: Secondary | ICD-10-CM | POA: Diagnosis not present

## 2022-12-01 DIAGNOSIS — M23312 Other meniscus derangements, anterior horn of medial meniscus, left knee: Secondary | ICD-10-CM | POA: Diagnosis not present

## 2022-12-01 DIAGNOSIS — M25462 Effusion, left knee: Secondary | ICD-10-CM | POA: Diagnosis not present

## 2022-12-01 DIAGNOSIS — M25562 Pain in left knee: Secondary | ICD-10-CM

## 2022-12-01 NOTE — Patient Instructions (Addendum)
Xray today Exercises Ice 20 min 2x a day Voltaren gel See me again in 6-8 weeks

## 2022-12-01 NOTE — Assessment & Plan Note (Addendum)
New problem.  Patient given injection and tolerated the procedure well, discussed icing regimen exercise, discussed which activities to do and which ones to avoid.  Discussed avoiding twisting motions.  Avoided oral anti-inflammatories secondary to the chronic kidney disease.  Work with Event organiser following home exercises in greater detail.  Follow-up with me again in 6 to 8 weeks.

## 2022-12-04 ENCOUNTER — Other Ambulatory Visit: Payer: Self-pay | Admitting: Family Medicine

## 2022-12-04 DIAGNOSIS — E349 Endocrine disorder, unspecified: Secondary | ICD-10-CM

## 2022-12-15 ENCOUNTER — Ambulatory Visit (INDEPENDENT_AMBULATORY_CARE_PROVIDER_SITE_OTHER): Payer: Medicare PPO | Admitting: Family Medicine

## 2022-12-15 ENCOUNTER — Encounter: Payer: Self-pay | Admitting: Family Medicine

## 2022-12-15 VITALS — BP 129/81 | HR 94 | Ht 70.0 in | Wt 195.0 lb

## 2022-12-15 DIAGNOSIS — R1013 Epigastric pain: Secondary | ICD-10-CM

## 2022-12-15 DIAGNOSIS — Z0000 Encounter for general adult medical examination without abnormal findings: Secondary | ICD-10-CM

## 2022-12-15 DIAGNOSIS — E349 Endocrine disorder, unspecified: Secondary | ICD-10-CM

## 2022-12-15 DIAGNOSIS — I10 Essential (primary) hypertension: Secondary | ICD-10-CM

## 2022-12-15 DIAGNOSIS — Z0001 Encounter for general adult medical examination with abnormal findings: Secondary | ICD-10-CM | POA: Diagnosis not present

## 2022-12-15 MED ORDER — FEBUXOSTAT 80 MG PO TABS: 1.0000 | ORAL_TABLET | Freq: Every day | ORAL | 1 refills | Status: DC

## 2022-12-15 MED ORDER — BENAZEPRIL HCL 20 MG PO TABS
20.0000 mg | ORAL_TABLET | Freq: Every day | ORAL | 3 refills | Status: DC
Start: 2022-12-15 — End: 2023-09-18

## 2022-12-15 MED ORDER — TESTOSTERONE 20.25 MG/ACT (1.62%) TD GEL: 2.0000 g | Freq: Every day | TRANSDERMAL | 3 refills | Status: DC

## 2022-12-15 MED ORDER — COLCHICINE 0.6 MG PO TABS: ORAL_TABLET | ORAL | 1 refills | Status: AC

## 2022-12-15 MED ORDER — FAMOTIDINE 20 MG PO TABS
20.0000 mg | ORAL_TABLET | Freq: Two times a day (BID) | ORAL | 3 refills | Status: DC
Start: 2022-12-15 — End: 2023-09-18

## 2022-12-15 NOTE — Progress Notes (Signed)
Subjective:   Matthew [name] is a 67 y.o. male who presents for a Welcome to Medicare exam.   Review of Systems: Review of Systems  Constitutional:  Negative for chills and fever.  HENT:  Negative for ear pain and tinnitus.   Eyes:  Negative for blurred vision and pain.  Respiratory:  Negative for cough, shortness of breath and wheezing.   Cardiovascular:  Negative for chest pain, palpitations and leg swelling.  Gastrointestinal:  Negative for abdominal pain, blood in stool, constipation, diarrhea and melena.  Genitourinary:  Negative for dysuria and hematuria.  Musculoskeletal:  Negative for back pain, joint pain and myalgias.  Skin:  Negative for rash.  Neurological:  Negative for dizziness, sensory change, focal weakness, weakness and headaches.  Psychiatric/Behavioral:  Negative for depression and suicidal ideas.     Cardiac Risk Factors include: advanced age (>43men, >44 women);male gender The 10-year ASCVD risk score (Arnett DK, et al., 2019) is: 13.1%   Values used to calculate the score:     Age: 60 years     Sex: Male     Is Non-Hispanic African American: No     Diabetic: No     Tobacco smoker: No     Systolic Blood Pressure: 129 mmHg     Is BP treated: Yes     HDL Cholesterol: 42 mg/dL     Total Cholesterol: 142 mg/dL      Objective:    Today's Vitals   12/15/22 1040  BP: 129/81  Pulse: 94  SpO2: 96%  Weight: 195 lb (88.5 kg)  Height: 5\' 10"  (1.778 m)   Body mass index is 27.98 kg/m.  Medications Outpatient Encounter Medications as of 12/15/2022  Medication Sig   amitriptyline (ELAVIL) 10 MG tablet Take 10 mg by mouth at bedtime.   fluticasone (FLONASE) 50 MCG/ACT nasal spray Place into both nostrils as needed for allergies or rhinitis.   omeprazole (PRILOSEC) 40 MG capsule Take 40 mg by mouth every morning.   polyethylene glycol (MIRALAX / GLYCOLAX) packet Take 17 g by mouth daily.   rosuvastatin (CRESTOR) 20 MG tablet Take 1 tablet (20 mg total) by  mouth daily.   sildenafil (REVATIO) 20 MG tablet Take 1-5 tablets (20-100 mg total) by mouth daily as needed.   tamsulosin (FLOMAX) 0.4 MG CAPS capsule Take 1 capsule (0.4 mg total) by mouth 2 (two) times daily.   [DISCONTINUED] benazepril (LOTENSIN) 20 MG tablet Take 1 tablet (20 mg total) by mouth daily.   [DISCONTINUED] colchicine 0.6 MG tablet Take twice daily for gout attack   [DISCONTINUED] famotidine (PEPCID) 20 MG tablet TAKE 1 TABLET BY MOUTH TWICE A DAY   [DISCONTINUED] Febuxostat 80 MG TABS Take 1 tablet (80 mg total) by mouth daily.   [DISCONTINUED] Testosterone 20.25 MG/ACT (1.62%) GEL APPLY 2 PUMP TOPICALLY DAILY.   benazepril (LOTENSIN) 20 MG tablet Take 1 tablet (20 mg total) by mouth daily.   colchicine 0.6 MG tablet Take twice daily for gout attack   famotidine (PEPCID) 20 MG tablet Take 1 tablet (20 mg total) by mouth 2 (two) times daily.   Febuxostat 80 MG TABS Take 1 tablet (80 mg total) by mouth daily.   Testosterone 20.25 MG/ACT (1.62%) GEL Apply 2 g topically daily.   No facility-administered encounter medications on file as of 12/15/2022.     History: Past Medical History:  Diagnosis Date   Allergic rhinitis    BPH (benign prostatic hyperplasia)    Chronic constipation  ED (erectile dysfunction)    GERD (gastroesophageal reflux disease)    Gout    Heme positive stool    history of, normal colonoscopy   History of sleep apnea    corrected after T&A procedure   Hyperlipidemia    Hypertension    Microscopic hematuria    Nocturia    Urinary catheter in place    2018 x one month   Past Surgical History:  Procedure Laterality Date   COLONOSCOPY  06/2017   laser vaporization of prostate     SHOULDER SURGERY Right    THULIUM LASER TURP (TRANSURETHRAL RESECTION OF PROSTATE) N/A 12/26/2017   Procedure: THULIUM LASER TURP (TRANSURETHRAL RESECTION OF PROSTATE);  Surgeon: Jerilee Field, MD;  Location: [facility];  Service: Urology;   Laterality: N/A;   TONSILLECTOMY AND ADENOIDECTOMY  2009   UPPER GI ENDOSCOPY      Family History  Problem Relation Age of Onset   Cancer Mother    Heart attack Father 49       Died age 67   Prostate cancer Paternal Uncle    Social History   Occupational History   Not on file  Tobacco Use   Smoking status: Never   Smokeless tobacco: Never  Vaping Use   Vaping status: Never Used  Substance and Sexual Activity   Alcohol use: Yes    Comment: 2 per month   Drug use: No   Sexual activity: Not on file    Tobacco Counseling Counseling given: Not Answered   Immunizations and Health Maintenance Immunization History  Administered Date(s) Administered   Hepatitis A 05/22/1998   Influenza Inj Mdck Quad Pf 01/04/2018   Influenza, Quadrivalent, Recombinant, Inj, Pf 12/13/2018   Influenza,inj,Quad PF,6+ Mos 02/20/2017, 12/13/2018, 01/08/2021, 01/17/2022   Influenza-Unspecified 01/03/2014, 01/06/2016, 02/20/2017, 01/04/2018, 12/28/2019   Moderna Covid-19 Vaccine Bivalent Booster 77yrs & up 02/19/2021   Moderna Sars-Covid-2 Vaccination 06/11/2019, 07/09/2019, 02/05/2020   PNEUMOCOCCAL CONJUGATE-20 07/20/2022   PPD Test 01/22/2016   Pneumococcal Polysaccharide-23 01/02/2014   Tdap 01/02/2014   Zoster Recombinant(Shingrix) 12/13/2018, 02/16/2019, 02/16/2019   Zoster, Live 12/20/2014   Health Maintenance Due  Topic Date Due   INFLUENZA VACCINE  11/10/2022   COVID-19 Vaccine (5 - 2023-24 season) 12/11/2022    Activities of Daily Living    12/15/2022   10:42 AM  In your present state of health, do you have any difficulty performing the following activities:  Hearing? 0  Vision? 0  Difficulty concentrating or making decisions? 0  Walking or climbing stairs? 0  Dressing or bathing? 0  Doing errands, shopping? 0  Preparing Food and eating ? N  Using the Toilet? N  In the past six months, have you accidently leaked urine? N  Do you have problems with loss of bowel control? N   Managing your Medications? N  Managing your Finances? N  Housekeeping or managing your Housekeeping? N    Physical Exam  (optional), or other factors deemed appropriate based on the beneficiary's medical and social history and current clinical standards.  Advanced Directives:   Has living will and advance directives    Assessment:    This is a routine wellness  examination for this patient .   Vision/Hearing screen No results found.   Goals   None      Depression Screen    12/15/2022   10:51 AM 07/20/2022   10:19 AM 01/17/2022    2:41 PM 07/12/2021   10:43 AM  PHQ 2/9 Scores  PHQ - 2 Score 0 0 0 0  PHQ- 9 Score   0 3     Fall Risk    12/15/2022   10:51 AM  Fall Risk   Falls in the past year? 0    Cognitive Function        12/15/2022   10:45 AM  6CIT Screen  What Year? 0 points  What month? 0 points  What time? 0 points  Count back from 20 0 points  Months in reverse 0 points  Repeat phrase 0 points  Total Score 0 points    Patient Care Team: Cheston Coury, [name], MD as PCP - General (Family Medicine)     Plan:    Problem List Items Addressed This Visit   None Visit Diagnoses     Encounter for Medicare annual wellness exam    -  Primary   Relevant Orders   CBC with Differential/Platelet   CMP14+EGFR   Lipid panel   Essential hypertension       Relevant Medications   benazepril (LOTENSIN) 20 MG tablet   Other Relevant Orders   CBC with Differential/Platelet   CMP14+EGFR   Lipid panel   Epigastric pain       Relevant Medications   famotidine (PEPCID) 20 MG tablet   Testosterone deficiency       Relevant Medications   Testosterone 20.25 MG/ACT (1.62%) GEL        I have personally reviewed and noted the following in the patient's chart:   Medical and social history Use of alcohol, tobacco or illicit drugs  Current medications and supplements Functional ability and status Nutritional status Physical activity Advanced directives List  of other physicians Hospitalizations, surgeries, and ER visits in previous 12 months Vitals Screenings to include cognitive, depression, and falls Referrals and appointments  In addition, I have reviewed and discussed with patient certain preventive protocols, quality metrics, and best practice recommendations. A written personalized care plan for preventive services as well as general preventive health recommendations were provided to patient.     Nils Pyle, MD 12/15/2022

## 2022-12-26 ENCOUNTER — Ambulatory Visit: Payer: Medicare PPO | Admitting: Urology

## 2022-12-26 VITALS — BP 125/75 | HR 85

## 2022-12-26 DIAGNOSIS — N138 Other obstructive and reflux uropathy: Secondary | ICD-10-CM

## 2022-12-26 DIAGNOSIS — R339 Retention of urine, unspecified: Secondary | ICD-10-CM | POA: Diagnosis not present

## 2022-12-26 DIAGNOSIS — N401 Enlarged prostate with lower urinary tract symptoms: Secondary | ICD-10-CM | POA: Diagnosis not present

## 2022-12-26 DIAGNOSIS — N5201 Erectile dysfunction due to arterial insufficiency: Secondary | ICD-10-CM | POA: Diagnosis not present

## 2022-12-26 DIAGNOSIS — N139 Obstructive and reflux uropathy, unspecified: Secondary | ICD-10-CM

## 2022-12-26 LAB — URINALYSIS, ROUTINE W REFLEX MICROSCOPIC
Bilirubin, UA: NEGATIVE
Glucose, UA: NEGATIVE
Ketones, UA: NEGATIVE
Leukocytes,UA: NEGATIVE
Nitrite, UA: NEGATIVE
Protein,UA: NEGATIVE
RBC, UA: NEGATIVE
Specific Gravity, UA: 1.01 (ref 1.005–1.030)
Urobilinogen, Ur: 0.2 mg/dL (ref 0.2–1.0)
pH, UA: 7 (ref 5.0–7.5)

## 2022-12-26 LAB — BLADDER SCAN AMB NON-IMAGING: Scan Result: 759

## 2022-12-26 MED ORDER — SILDENAFIL CITRATE 20 MG PO TABS: 20.0000 mg | ORAL_TABLET | Freq: Every day | ORAL | 5 refills | Status: DC | PRN

## 2022-12-26 MED ORDER — TADALAFIL 20 MG PO TABS: 20.0000 mg | ORAL_TABLET | Freq: Every day | ORAL | 3 refills | Status: DC | PRN

## 2022-12-26 NOTE — Progress Notes (Unsigned)
12/26/2022 9:54 AM   [name] 02-24-1962 [id number]  Referring provider: Dettinger, [name], MD [address] [place],  [place] [address]  No chief complaint on file.   HPI: F/u -    1) BPH with incomplete bladder emptying-presented with urinary retention and bilateral hydronephrosis in 2019.  His prostate was 42 g.  He underwent thulium laser vaporization of the prostate in 2019.  He had increased urinary symptoms in 2022.  His post void was 700 and then 400.  U/S - no hydronephrosis.  A Apr 2023 CT was benign with no hydro. A May 2023 cystoscopy revealed some regrowth of the prostate and collapse of the channel voiding through a small slitlike aperture.  We considered TURP but he canceled that.  Tamsulosin increased to twice daily. IPSS was 4. Voiding adequately.    His August 2023 PSA was 0.5.  PSA has been as high as 2. Feb 2024 PSA 1.56.   2) low testosterone-10+ year history.  His prolactin and hematocrit were normal.  He tried T-Gel and Testopel in the past.  His August 2023 testosterone was 890 on T-gel with Dr. Louanne Skye.  October 2023 hematocrit 50.3.   3) ED-for several years.  Patient tried tadalafil 20 mg.  Patient underwent low intensity shockwave therapy.   Today, seen for the above. Mar 2024 Cr 1.6. Mar 2024 Renal US - no hydro, bladder 1094 and then 498 ml p voiding. Yearly labs pending with Dr. Louanne Skye. Also sees Buthani. IPSS = 11. He doesn't have bothersome incomplete bladder emptying but not sure if empty. Noc x 0-1. No incontinence. Needs a Rx for tadalafil.    He has no gross hematuria.    PMH: Past Medical History:  Diagnosis Date   Allergic rhinitis    BPH (benign prostatic hyperplasia)    Chronic constipation    ED (erectile dysfunction)    GERD (gastroesophageal reflux disease)    Gout    Heme positive stool    history of, normal colonoscopy   History of sleep apnea    corrected after T&A procedure   Hyperlipidemia    Hypertension     Microscopic hematuria    Nocturia    Urinary catheter in place    2018 x one month    Surgical History: Past Surgical History:  Procedure Laterality Date   COLONOSCOPY  06/2017   laser vaporization of prostate     SHOULDER SURGERY Right    THULIUM LASER TURP (TRANSURETHRAL RESECTION OF PROSTATE) N/A 12/26/2017   Procedure: THULIUM LASER TURP (TRANSURETHRAL RESECTION OF PROSTATE);  Surgeon: Jerilee Field, MD;  Location: [facility];  Service: Urology;  Laterality: N/A;   TONSILLECTOMY AND ADENOIDECTOMY  2009   UPPER GI ENDOSCOPY      Home Medications:  Allergies as of 12/26/2022   No Known Allergies      Medication List        Accurate as of December 26, 2022  9:54 AM. If you have any questions, ask your nurse or doctor.          amitriptyline 10 MG tablet Commonly known as: ELAVIL Take 10 mg by mouth at bedtime.   benazepril 20 MG tablet Commonly known as: LOTENSIN Take 1 tablet (20 mg total) by mouth daily.   colchicine 0.6 MG tablet Take twice daily for gout attack   famotidine 20 MG tablet Commonly known as: PEPCID Take 1 tablet (20 mg total) by mouth 2 (two) times daily.  Febuxostat 80 MG Tabs Take 1 tablet (80 mg total) by mouth daily.   fluticasone 50 MCG/ACT nasal spray Commonly known as: FLONASE Place into both nostrils as needed for allergies or rhinitis.   omeprazole 40 MG capsule Commonly known as: PRILOSEC Take 40 mg by mouth every morning.   polyethylene glycol 17 g packet Commonly known as: MIRALAX / GLYCOLAX Take 17 g by mouth daily.   rosuvastatin 20 MG tablet Commonly known as: CRESTOR Take 1 tablet (20 mg total) by mouth daily.   sildenafil 20 MG tablet Commonly known as: REVATIO Take 1-5 tablets (20-100 mg total) by mouth daily as needed.   tamsulosin 0.4 MG Caps capsule Commonly known as: FLOMAX Take 1 capsule (0.4 mg total) by mouth 2 (two) times daily.   Testosterone 20.25 MG/ACT (1.62%) Gel Apply 2  g topically daily.        Allergies: No Known Allergies  Family History: Family History  Problem Relation Age of Onset   Cancer Mother    Heart attack Father 45       Died age 75   Prostate cancer Paternal Uncle     Social History:  reports that he has never smoked. He has never used smokeless tobacco. He reports current alcohol use. He reports that he does not use drugs.   Physical Exam: There were no vitals taken for this visit.  Constitutional:  Alert and oriented, No acute distress. HEENT: [place] AT, moist mucus membranes.  Trachea midline, no masses. Cardiovascular: No clubbing, cyanosis, or edema. Respiratory: Normal respiratory effort, no increased work of breathing. GI: Abdomen is soft, nontender, nondistended, no abdominal masses GU: No CVA tenderness Skin: No rashes, bruises or suspicious lesions. Neurologic: Grossly intact, no focal deficits, moving all 4 extremities. Psychiatric: Normal mood and affect.  Laboratory Data: Lab Results  Component Value Date   WBC 7.3 05/21/2022   HGB 16.7 05/21/2022   HCT 49.3 05/21/2022   MCV 89.2 05/21/2022   PLT 232 05/21/2022    Lab Results  Component Value Date   CREATININE 1.56 (H) 05/21/2022    Lab Results  Component Value Date   PSA 0.6 07/08/2014   PSA 0.51 02/19/2013   PSA 0.7 11/14/2012    Lab Results  Component Value Date   TESTOSTERONE 496 01/08/2021    Lab Results  Component Value Date   HGBA1C 5.7 (H) 01/17/2022    Urinalysis    Component Value Date/Time   COLORURINE COLORLESS (A) 05/21/2022 2104   APPEARANCEUR Clear 06/27/2022 1011   LABSPEC 1.005 05/21/2022 2104   PHURINE 6.5 05/21/2022 2104   GLUCOSEU Negative 06/27/2022 1011   HGBUR NEGATIVE 05/21/2022 2104   BILIRUBINUR Negative 06/27/2022 1011   KETONESUR NEGATIVE 05/21/2022 2104   PROTEINUR Negative 06/27/2022 1011   PROTEINUR NEGATIVE 05/21/2022 2104   NITRITE Negative 06/27/2022 1011   NITRITE NEGATIVE 05/21/2022 2104    LEUKOCYTESUR Negative 06/27/2022 1011   LEUKOCYTESUR NEGATIVE 05/21/2022 2104    Lab Results  Component Value Date   LABMICR See below: 06/27/2022   WBCUA 0-5 06/27/2022   RBCUA 3-10 (A) 09/22/2017   LABEPIT 0-10 06/27/2022   BACTERIA None seen 06/27/2022    Pertinent Imaging:  Results for orders placed during the hospital encounter of 07/05/22  US RENAL  Narrative CLINICAL DATA:  Microhematuria  EXAM: RENAL / URINARY TRACT ULTRASOUND COMPLETE  COMPARISON:  None Available.  FINDINGS: Right Kidney:  Renal measurements: 11.9 x 6.7 x 6.0 cm = volume: 257 mL. Mild pelvicaliectasis,  not identified previously.  Left Kidney:  Renal measurements: 9.4 x 4.7 x 4.9 cm = volume: 113 mL. Cortical thinning. Increased cortical echogenicity.  Bladder:  Distended with a prevoid volume of 1094 cc and postvoid volume of 498 cc.  Other:  None.  IMPRESSION: 1. Mild pelvicaliectasis on the right, not identified previously. CT imaging could better evaluate if clinically warranted. 2. The left kidney is smaller than the right with cortical thinning and increased cortical echogenicity consistent with chronic medical renal disease. 3. The bladder is distended with a prevoid volume of 1094 cc and a postvoid volume of 498 cc.   Electronically Signed By: Gerome Sam III M.D. On: 07/05/2022 17:39  No valid procedures specified. No results found for this or any previous visit.  No results found for this or any previous visit.   Assessment & Plan:    1. Incomplete bladder emptying  - Urinalysis, Routine w reflex microscopic - BLADDER SCAN AMB NON-IMAGING  2. BPH with obstruction/lower urinary tract symptoms He asked about minimally invasive tx. With his h/o and prior laser we could do a quick TURP and he would do well. We could consider OPL, iTIND or WVTT but he would need another cystoscopy. He will consider.  - Urinalysis, Routine w reflex microscopic - BLADDER SCAN  AMB NON-IMAGING  3. ED - tadalafil refilled. He can alternate PRN dosing for tadalafil and sildenafil. Dont take at same time.   4. Obs/reflux uropathy - repeat renal US in 6 mo to ensure no recurrence   No follow-ups on file.  Jerilee Field, MD  [facility]  [address] [place], [place] [address] [phone]

## 2022-12-26 NOTE — Progress Notes (Unsigned)
post void residual= 759

## 2023-01-17 ENCOUNTER — Other Ambulatory Visit: Payer: Medicare PPO

## 2023-01-17 NOTE — Progress Notes (Unsigned)
Matthew [name] [address] [place] [address] Phone: [phone] Subjective:   Matthew [name], am serving as a scribe for Dr. Antoine [name].  I'm seeing this patient by the request  of:  [name], Matthew Radon, MD  CC: Left knee pain follow-up  ION:[id number]  12/01/2022 New problem.  Patient given injection and tolerated the procedure well, discussed icing regimen exercise, discussed which activities to do and which ones to avoid.  Discussed avoiding twisting motions.  Avoided oral anti-inflammatories secondary to the chronic kidney disease.  Work with Event organiser following home exercises in greater detail.  Follow-up with me again in 6 to 8 weeks.      Update 01/18/2023 Matthew [name] is a 67 y.o. male coming in with complaint of L knee pain. Patient states  injection worked for about 3 weeks. Has gotten progressive worse. This week is the worst. Hasn't worn the knee brace until this week. Now hurting at night. Not keeping him up at night. But overall much better.  Xray L knee 12/01/2022 IMPRESSION: Moderate effusion.  No acute osseous abnormalities.    Past Medical History:  Diagnosis Date   Allergic rhinitis    BPH (benign prostatic hyperplasia)    Chronic constipation    ED (erectile dysfunction)    GERD (gastroesophageal reflux disease)    Gout    Heme positive stool    history of, normal colonoscopy   History of sleep apnea    corrected after T&A procedure   Hyperlipidemia    Hypertension    Microscopic hematuria    Nocturia    Urinary catheter in place    2018 x one month   Past Surgical History:  Procedure Laterality Date   COLONOSCOPY  06/2017   laser vaporization of prostate     SHOULDER SURGERY Right    THULIUM LASER TURP (TRANSURETHRAL RESECTION OF PROSTATE) N/A 12/26/2017   Procedure: THULIUM LASER TURP (TRANSURETHRAL RESECTION OF PROSTATE);  Surgeon: Jerilee Field, MD;  Location: [facility];   Service: Urology;  Laterality: N/A;   TONSILLECTOMY AND ADENOIDECTOMY  2009   UPPER GI ENDOSCOPY     Social History   Socioeconomic History   Marital status: Married    Spouse name: Not on file   Number of children: Not on file   Years of education: Not on file   Highest education level: Not on file  Occupational History   Not on file  Tobacco Use   Smoking status: Never   Smokeless tobacco: Never  Vaping Use   Vaping status: Never Used  Substance and Sexual Activity   Alcohol use: Yes    Comment: 2 per month   Drug use: No   Sexual activity: Not on file  Other Topics Concern   Not on file  Social History Narrative   Not on file   Social Determinants of Health   Financial Resource Strain: Low Risk  (07/30/2022)   Received from [facility], [facility]   Overall Financial Resource Strain (CARDIA)    Difficulty of Paying Living Expenses: Not very hard  Food Insecurity: No Food Insecurity (07/30/2022)   Received from [facility], [facility]   Hunger Vital Sign    Worried About Running Out of Food in the Last Year: Never true    Ran Out of Food in the Last Year: Never true  Transportation Needs: No Transportation Needs (07/30/2022)   Received from [facility], [facility]   PRAPARE -  Administrator, Civil Service (Medical): No    Lack of Transportation (Non-Medical): No  Physical Activity: Insufficiently Active (07/30/2022)   Received from [facility], [facility]   Exercise Vital Sign    Days of Exercise per Week: 3 days    Minutes of Exercise per Session: 30 min  Stress: No Stress Concern Present (07/30/2022)   Received from [facility], [facility] of Occupational Health - Occupational Stress Questionnaire    Feeling of Stress : Only a little  Social Connections: Socially Integrated (07/30/2022)   Received from [facility], [facility]   Social Network    How would you rate your social network (family, work,  friends)?: Good participation with social networks   No Known Allergies Family History  Problem Relation Age of Onset   Cancer Mother    Heart attack Father 74       Died age 110   Prostate cancer Paternal Uncle     Current Outpatient Medications (Endocrine & Metabolic):    Testosterone 20.25 MG/ACT (1.62%) GEL, Apply 2 g topically daily.  Current Outpatient Medications (Cardiovascular):    benazepril (LOTENSIN) 20 MG tablet, Take 1 tablet (20 mg total) by mouth daily.   rosuvastatin (CRESTOR) 20 MG tablet, Take 1 tablet (20 mg total) by mouth daily.   sildenafil (REVATIO) 20 MG tablet, Take 1-5 tablets (20-100 mg total) by mouth daily as needed.   tadalafil (CIALIS) 20 MG tablet, Take 1 tablet (20 mg total) by mouth daily as needed for erectile dysfunction.  Current Outpatient Medications (Respiratory):    fluticasone (FLONASE) 50 MCG/ACT nasal spray, Place into both nostrils as needed for allergies or rhinitis.  Current Outpatient Medications (Analgesics):    colchicine 0.6 MG tablet, Take twice daily for gout attack   Febuxostat 80 MG TABS, Take 1 tablet (80 mg total) by mouth daily.   Current Outpatient Medications (Other):    amitriptyline (ELAVIL) 10 MG tablet, Take 10 mg by mouth at bedtime.   famotidine (PEPCID) 20 MG tablet, Take 1 tablet (20 mg total) by mouth 2 (two) times daily.   omeprazole (PRILOSEC) 40 MG capsule, Take 40 mg by mouth every morning.   polyethylene glycol (MIRALAX / GLYCOLAX) packet, Take 17 g by mouth daily.   tamsulosin (FLOMAX) 0.4 MG CAPS capsule, Take 1 capsule (0.4 mg total) by mouth 2 (two) times daily.   Reviewed prior external information including notes and imaging from  primary care provider As well as notes that were available from care everywhere and other healthcare systems.  Past medical history, social, surgical and family history all reviewed in electronic medical record.  No pertanent information unless stated regarding to the  chief complaint.   Review of Systems:  No headache, visual changes, nausea, vomiting, diarrhea, constipation, dizziness, abdominal pain, skin rash, fevers, chills, night sweats, weight loss, swollen lymph nodes, body aches, joint swelling, chest pain, shortness of breath, mood changes. POSITIVE muscle aches  Objective  Blood pressure 116/80, pulse 73, height 5\' 10"  (1.778 m), weight 197 lb (89.4 kg), SpO2 97%.   General: No apparent distress alert and oriented x3 mood and affect normal, dressed appropriately.  HEENT: Pupils equal, extraocular movements intact  Respiratory: Patient's speak in full sentences and does not appear short of breath  Cardiovascular: No lower extremity edema, non tender, no erythema  Knee exam shows patient does have still a trace effusion noted of the knee.  Still has a significant though improvement  in range of motion at the moment.  No tenderness noted.  Mild positive McMurray's  Limited muscular skeletal ultrasound was performed and interpreted by Matthew [name], M  Limited ultrasound shows that there is some hypoechoic changes but actually improvement in the patellofemoral area.  Still has the changes that appear to be acute on chronic on the medial meniscus with very mild displacement noted. Impression: Interval improvement    Impression and Recommendations:    The above documentation has been reviewed and is accurate and complete Judi Saa, DO

## 2023-01-18 ENCOUNTER — Other Ambulatory Visit: Payer: Medicare PPO

## 2023-01-18 ENCOUNTER — Ambulatory Visit: Payer: Self-pay

## 2023-01-18 ENCOUNTER — Ambulatory Visit: Payer: Medicare PPO | Admitting: Family Medicine

## 2023-01-18 VITALS — BP 116/80 | HR 73 | Ht 70.0 in | Wt 197.0 lb

## 2023-01-18 DIAGNOSIS — Z0000 Encounter for general adult medical examination without abnormal findings: Secondary | ICD-10-CM

## 2023-01-18 DIAGNOSIS — E785 Hyperlipidemia, unspecified: Secondary | ICD-10-CM | POA: Diagnosis not present

## 2023-01-18 DIAGNOSIS — M23312 Other meniscus derangements, anterior horn of medial meniscus, left knee: Secondary | ICD-10-CM | POA: Diagnosis not present

## 2023-01-18 DIAGNOSIS — I10 Essential (primary) hypertension: Secondary | ICD-10-CM | POA: Diagnosis not present

## 2023-01-18 DIAGNOSIS — M25562 Pain in left knee: Secondary | ICD-10-CM | POA: Diagnosis not present

## 2023-01-18 DIAGNOSIS — G8929 Other chronic pain: Secondary | ICD-10-CM | POA: Diagnosis not present

## 2023-01-18 NOTE — Assessment & Plan Note (Signed)
Still has effusion noted.  Discussed with patient about icing regimen, compression, topical anti-inflammatories and avoiding oral anti-inflammatories were possible.  Discussed may need to consider the possibility of injection again or the possibility of viscosupplementation if needed.  Discussed with patient about icing regimen and home exercises.  Discussed which activities to do and which ones to avoid.  Follow-up with me again in 6 to 8 weeks otherwise.

## 2023-01-18 NOTE — Patient Instructions (Signed)
Aleve 2 2 2  Ice after activity We'll keep watching it See you again in 6-8 weeks

## 2023-01-19 ENCOUNTER — Ambulatory Visit: Payer: Medicare PPO | Admitting: Family Medicine

## 2023-01-19 VITALS — BP 132/80 | HR 87 | Ht 70.0 in | Wt 198.0 lb

## 2023-01-19 DIAGNOSIS — E785 Hyperlipidemia, unspecified: Secondary | ICD-10-CM

## 2023-01-19 DIAGNOSIS — I10 Essential (primary) hypertension: Secondary | ICD-10-CM

## 2023-01-19 LAB — CBC WITH DIFFERENTIAL/PLATELET
Basophils Absolute: 0 10*3/uL (ref 0.0–0.2)
Basos: 1 %
EOS (ABSOLUTE): 0.2 10*3/uL (ref 0.0–0.4)
Eos: 4 %
Hematocrit: 55.3 % — ABNORMAL HIGH (ref 37.5–51.0)
Hemoglobin: 17.3 g/dL (ref 13.0–17.7)
Immature Grans (Abs): 0 10*3/uL (ref 0.0–0.1)
Immature Granulocytes: 0 %
Lymphocytes Absolute: 1.7 10*3/uL (ref 0.7–3.1)
Lymphs: 33 %
MCH: 28.5 pg (ref 26.6–33.0)
MCHC: 31.3 g/dL — ABNORMAL LOW (ref 31.5–35.7)
MCV: 91 fL (ref 79–97)
Monocytes Absolute: 0.4 10*3/uL (ref 0.1–0.9)
Monocytes: 7 %
Neutrophils Absolute: 2.8 10*3/uL (ref 1.4–7.0)
Neutrophils: 55 %
Platelets: 246 10*3/uL (ref 150–450)
RBC: 6.07 x10E6/uL — ABNORMAL HIGH (ref 4.14–5.80)
RDW: 13.7 % (ref 11.6–15.4)
WBC: 5 10*3/uL (ref 3.4–10.8)

## 2023-01-19 LAB — CMP14+EGFR
ALT: 19 IU/L (ref 0–44)
AST: 24 IU/L (ref 0–40)
Albumin: 4.7 g/dL (ref 3.9–4.9)
Alkaline Phosphatase: 66 IU/L (ref 44–121)
BUN/Creatinine Ratio: 15 (ref 10–24)
BUN: 24 mg/dL (ref 8–27)
Bilirubin Total: 0.8 mg/dL (ref 0.0–1.2)
CO2: 27 mmol/L (ref 20–29)
Calcium: 9.7 mg/dL (ref 8.6–10.2)
Chloride: 100 mmol/L (ref 96–106)
Creatinine, Ser: 1.59 mg/dL — ABNORMAL HIGH (ref 0.76–1.27)
Globulin, Total: 2.3 g/dL (ref 1.5–4.5)
Glucose: 92 mg/dL (ref 70–99)
Potassium: 5 mmol/L (ref 3.5–5.2)
Sodium: 140 mmol/L (ref 134–144)
Total Protein: 7 g/dL (ref 6.0–8.5)
eGFR: 48 mL/min/{1.73_m2} — ABNORMAL LOW (ref >59)

## 2023-01-19 LAB — LIPID PANEL
Cholesterol, Total: 174 mg/dL (ref 100–199)
HDL: 44 mg/dL (ref >39)
LDL CALC COMMENT:: 4 ratio (ref 0.0–5.0)
LDL Chol Calc (NIH): 100 mg/dL — ABNORMAL HIGH (ref 0–99)
Triglycerides: 172 mg/dL — ABNORMAL HIGH (ref 0–149)
VLDL Cholesterol Cal: 30 mg/dL (ref 5–40)

## 2023-01-19 NOTE — Progress Notes (Signed)
BP 132/80   Pulse 87   Ht 5\' 10"  (1.778 m)   Wt 198 lb (89.8 kg)   SpO2 98%   BMI 28.41 kg/m    Subjective:   Patient ID: Matthew [name], male    DOB: [birth year], 67 y.o.   MRN: [id number]  HPI: Matthew [name] is a 67 y.o. male presenting on 01/19/2023 for Medical Management of Chronic Issues and Hypertension   HPI Hypertension and CKD Patient is currently on benazepril, and their blood pressure today is 132/80. Patient denies any lightheadedness or dizziness. Patient denies headaches, blurred vision, chest pains, shortness of breath, or weakness. Denies any side effects from medication and is content with current medication.   Hyperlipidemia Patient is coming in for recheck of his hyperlipidemia. The patient is currently taking Crestor. They deny any issues with myalgias or history of liver damage from it. They deny any focal numbness or weakness or chest pain.   Relevant past medical, surgical, family and social history reviewed and updated as indicated. Interim medical history since our last visit reviewed. Allergies and medications reviewed and updated.  Review of Systems  Constitutional:  Negative for chills and fever.  Eyes:  Negative for visual disturbance.  Respiratory:  Negative for shortness of breath and wheezing.   Cardiovascular:  Negative for chest pain and leg swelling.  Musculoskeletal:  Negative for back pain and gait problem.  Skin:  Negative for rash.  Neurological:  Negative for dizziness, weakness and light-headedness.  All other systems reviewed and are negative.   Per HPI unless specifically indicated above   Allergies as of 01/19/2023   No Known Allergies      Medication List        Accurate as of January 19, 2023 10:16 AM. If you have any questions, ask your nurse or doctor.          amitriptyline 10 MG tablet Commonly known as: ELAVIL Take 10 mg by mouth at bedtime.   benazepril 20 MG tablet Commonly known as: LOTENSIN Take 1  tablet (20 mg total) by mouth daily.   colchicine 0.6 MG tablet Take twice daily for gout attack   famotidine 20 MG tablet Commonly known as: PEPCID Take 1 tablet (20 mg total) by mouth 2 (two) times daily.   Febuxostat 80 MG Tabs Take 1 tablet (80 mg total) by mouth daily.   fluticasone 50 MCG/ACT nasal spray Commonly known as: FLONASE Place into both nostrils as needed for allergies or rhinitis.   omeprazole 40 MG capsule Commonly known as: PRILOSEC Take 40 mg by mouth every morning.   polyethylene glycol 17 g packet Commonly known as: MIRALAX / GLYCOLAX Take 17 g by mouth daily.   rosuvastatin 20 MG tablet Commonly known as: CRESTOR Take 1 tablet (20 mg total) by mouth daily.   sildenafil 20 MG tablet Commonly known as: REVATIO Take 1-5 tablets (20-100 mg total) by mouth daily as needed.   tadalafil 20 MG tablet Commonly known as: CIALIS Take 1 tablet (20 mg total) by mouth daily as needed for erectile dysfunction.   tamsulosin 0.4 MG Caps capsule Commonly known as: FLOMAX Take 1 capsule (0.4 mg total) by mouth 2 (two) times daily.   Testosterone 20.25 MG/ACT (1.62%) Gel Apply 2 g topically daily.         Objective:   BP 132/80   Pulse 87   Ht 5\' 10"  (1.778 m)   Wt 198 lb (89.8 kg)   SpO2 98%  BMI 28.41 kg/m   Wt Readings from Last 3 Encounters:  01/19/23 198 lb (89.8 kg)  01/18/23 197 lb (89.4 kg)  12/15/22 195 lb (88.5 kg)    Physical Exam Vitals and nursing note reviewed.  Constitutional:      General: He is not in acute distress.    Appearance: He is well-developed. He is not diaphoretic.  Eyes:     General: No scleral icterus.    Conjunctiva/sclera: Conjunctivae normal.  Neck:     Thyroid: No thyromegaly.  Cardiovascular:     Rate and Rhythm: Normal rate and regular rhythm.     Heart sounds: Normal heart sounds. No murmur heard. Pulmonary:     Effort: Pulmonary effort is normal. No respiratory distress.     Breath sounds: Normal  breath sounds. No wheezing.  Musculoskeletal:        General: No swelling. Normal range of motion.     Cervical back: Neck supple.  Lymphadenopathy:     Cervical: No cervical adenopathy.  Skin:    General: Skin is warm and dry.     Findings: No rash.  Neurological:     Mental Status: He is alert and oriented to person, place, and time.     Coordination: Coordination normal.  Psychiatric:        Behavior: Behavior normal.     Results for orders placed or performed in visit on 01/18/23  Lipid panel  Result Value Ref Range   Cholesterol, Total 174 100 - 199 mg/dL   Triglycerides 308 (H) 0 - 149 mg/dL   HDL 44 >65 mg/dL   VLDL Cholesterol Cal 30 5 - 40 mg/dL   LDL Chol Calc (NIH) 784 (H) 0 - 99 mg/dL   Chol/HDL Ratio 4.0 0.0 - 5.0 ratio  CBC with Differential/Platelet  Result Value Ref Range   WBC 5.0 3.4 - 10.8 x10E3/uL   RBC 6.07 (H) 4.14 - 5.80 x10E6/uL   Hemoglobin 17.3 13.0 - 17.7 g/dL   Hematocrit 69.6 (H) 29.5 - 51.0 %   MCV 91 79 - 97 fL   MCH 28.5 26.6 - 33.0 pg   MCHC 31.3 (L) 31.5 - 35.7 g/dL   RDW 28.4 13.2 - 44.0 %   Platelets 246 150 - 450 x10E3/uL   Neutrophils 55 Not Estab. %   Lymphs 33 Not Estab. %   Monocytes 7 Not Estab. %   Eos 4 Not Estab. %   Basos 1 Not Estab. %   Neutrophils Absolute 2.8 1.4 - 7.0 x10E3/uL   Lymphocytes Absolute 1.7 0.7 - 3.1 x10E3/uL   Monocytes Absolute 0.4 0.1 - 0.9 x10E3/uL   EOS (ABSOLUTE) 0.2 0.0 - 0.4 x10E3/uL   Basophils Absolute 0.0 0.0 - 0.2 x10E3/uL   Immature Granulocytes 0 Not Estab. %   Immature Grans (Abs) 0.0 0.0 - 0.1 x10E3/uL  CMP14+EGFR  Result Value Ref Range   Glucose 92 70 - 99 mg/dL   BUN 24 8 - 27 mg/dL   Creatinine, Ser 1.02 (H) 0.76 - 1.27 mg/dL   eGFR 48 (L) >72 ZD/GUY/4.03   BUN/Creatinine Ratio 15 10 - 24   Sodium 140 134 - 144 mmol/L   Potassium 5.0 3.5 - 5.2 mmol/L   Chloride 100 96 - 106 mmol/L   CO2 27 20 - 29 mmol/L   Calcium 9.7 8.6 - 10.2 mg/dL   Total Protein 7.0 6.0 - 8.5 g/dL    Albumin 4.7 3.9 - 4.9 g/dL   Globulin, Total 2.3 1.5 -  4.5 g/dL   Bilirubin Total 0.8 0.0 - 1.2 mg/dL   Alkaline Phosphatase 66 44 - 121 IU/L   AST 24 0 - 40 IU/L   ALT 19 0 - 44 IU/L    Assessment & Plan:   Problem List Items Addressed This Visit       Cardiovascular and Mediastinum   Hypertension - Primary     Other   Hyperlipidemia with target LDL less than 100    Patient's kidney function is stable, his cholesterol is up slightly but he said he was at a wedding for the 10 days before the blood work so that might be part of the reason, he is going to focus on diet and follow-up in the future. Follow up plan: Return in about 6 months (around 07/20/2023), or if symptoms worsen or fail to improve, for Pretension and hyperlipidemia.  Counseling provided for all of the vaccine components No orders of the defined types were placed in this encounter.   Arville Care, MD [facility] 01/19/2023, 10:16 AM

## 2023-02-02 DIAGNOSIS — H25813 Combined forms of age-related cataract, bilateral: Secondary | ICD-10-CM | POA: Diagnosis not present

## 2023-02-02 DIAGNOSIS — H35413 Lattice degeneration of retina, bilateral: Secondary | ICD-10-CM | POA: Diagnosis not present

## 2023-02-14 ENCOUNTER — Other Ambulatory Visit: Payer: Self-pay | Admitting: Family Medicine

## 2023-02-14 DIAGNOSIS — E349 Endocrine disorder, unspecified: Secondary | ICD-10-CM

## 2023-02-24 ENCOUNTER — Other Ambulatory Visit: Payer: Medicare PPO

## 2023-02-24 DIAGNOSIS — N1832 Chronic kidney disease, stage 3b: Secondary | ICD-10-CM | POA: Diagnosis not present

## 2023-02-24 DIAGNOSIS — Z5181 Encounter for therapeutic drug level monitoring: Secondary | ICD-10-CM | POA: Diagnosis not present

## 2023-02-24 DIAGNOSIS — I129 Hypertensive chronic kidney disease with stage 1 through stage 4 chronic kidney disease, or unspecified chronic kidney disease: Secondary | ICD-10-CM | POA: Diagnosis not present

## 2023-02-24 DIAGNOSIS — R3914 Feeling of incomplete bladder emptying: Secondary | ICD-10-CM | POA: Diagnosis not present

## 2023-02-24 DIAGNOSIS — R809 Proteinuria, unspecified: Secondary | ICD-10-CM | POA: Diagnosis not present

## 2023-02-24 DIAGNOSIS — N189 Chronic kidney disease, unspecified: Secondary | ICD-10-CM | POA: Diagnosis not present

## 2023-02-28 NOTE — Progress Notes (Unsigned)
Matthew [name] [address] [place] [address] Phone: [phone] Subjective:   Matthew [name], am serving as a scribe for Dr. Antoine Primas.  I'm seeing this patient by the request  of:  Matthew [name], Matthew Radon, MD  CC: left knee pain follow up   [id number]  01/18/2023 Still has effusion noted.  Discussed with patient about icing regimen, compression, topical anti-inflammatories and avoiding oral anti-inflammatories were possible.  Discussed may need to consider the possibility of injection again or the possibility of viscosupplementation if needed.  Discussed with patient about icing regimen and home exercises.  Discussed which activities to do and which ones to avoid.  Follow-up with me again in 6 to 8 weeks otherwise.      Update 03/01/2023 Matthew [name] is a 67 y.o. male coming in with complaint of L knee pain. Patient states not much improvement. L hip is hurting now. Still walking but not having a great time doing it. Also looking for MSK.     Past Medical History:  Diagnosis Date   Allergic rhinitis    BPH (benign prostatic hyperplasia)    Chronic constipation    ED (erectile dysfunction)    GERD (gastroesophageal reflux disease)    Gout    Heme positive stool    history of, normal colonoscopy   History of sleep apnea    corrected after T&A procedure   Hyperlipidemia    Hypertension    Microscopic hematuria    Nocturia    Urinary catheter in place    2018 x one month   Past Surgical History:  Procedure Laterality Date   COLONOSCOPY  06/2017   laser vaporization of prostate     SHOULDER SURGERY Right    THULIUM LASER TURP (TRANSURETHRAL RESECTION OF PROSTATE) N/A 12/26/2017   Procedure: THULIUM LASER TURP (TRANSURETHRAL RESECTION OF PROSTATE);  Surgeon: Jerilee Field, MD;  Location: [facility];  Service: Urology;  Laterality: N/A;   TONSILLECTOMY AND ADENOIDECTOMY  2009   UPPER GI ENDOSCOPY     Social  History   Socioeconomic History   Marital status: Married    Spouse name: Not on file   Number of children: Not on file   Years of education: Not on file   Highest education level: Master's degree (e.g., MA, MS, MEng, MEd, MSW, MBA)  Occupational History   Not on file  Tobacco Use   Smoking status: Never   Smokeless tobacco: Never  Vaping Use   Vaping status: Never Used  Substance and Sexual Activity   Alcohol use: Yes    Comment: 2 per month   Drug use: No   Sexual activity: Not on file  Other Topics Concern   Not on file  Social History Narrative   Not on file   Social Determinants of Health   Financial Resource Strain: Low Risk  (01/19/2023)   Overall Financial Resource Strain (CARDIA)    Difficulty of Paying Living Expenses: Not hard at all  Food Insecurity: No Food Insecurity (01/19/2023)   Hunger Vital Sign    Worried About Running Out of Food in the Last Year: Never true    Ran Out of Food in the Last Year: Never true  Transportation Needs: No Transportation Needs (01/19/2023)   PRAPARE - Administrator, Civil Service (Medical): No    Lack of Transportation (Non-Medical): No  Physical Activity: Insufficiently Active (01/19/2023)   Exercise Vital Sign    Days of  Exercise per Week: 5 days    Minutes of Exercise per Session: 20 min  Stress: No Stress Concern Present (01/19/2023)   [employer] of Occupational Health - Occupational Stress Questionnaire    Feeling of Stress : Only a little  Social Connections: Socially Integrated (01/19/2023)   Social Connection and Isolation Panel [NHANES]    Frequency of Communication with Friends and Family: More than three times a week    Frequency of Social Gatherings with Friends and Family: More than three times a week    Attends Religious Services: 1 to 4 times per year    Active Member of [employer] or Organizations: Yes    Attends Banker Meetings: 1 to 4 times per year    Marital Status: Married    No Known Allergies Family History  Problem Relation Age of Onset   Cancer Mother    Heart attack Father 50       Died age 59   Prostate cancer Paternal Uncle     Current Outpatient Medications (Endocrine & Metabolic):    Testosterone 20.25 MG/ACT (1.62%) GEL, APPLY 2 PUMP TOPICALLY DAILY.  Current Outpatient Medications (Cardiovascular):    benazepril (LOTENSIN) 20 MG tablet, Take 1 tablet (20 mg total) by mouth daily.   rosuvastatin (CRESTOR) 20 MG tablet, Take 1 tablet (20 mg total) by mouth daily.   sildenafil (REVATIO) 20 MG tablet, Take 1-5 tablets (20-100 mg total) by mouth daily as needed.   tadalafil (CIALIS) 20 MG tablet, Take 1 tablet (20 mg total) by mouth daily as needed for erectile dysfunction.  Current Outpatient Medications (Respiratory):    fluticasone (FLONASE) 50 MCG/ACT nasal spray, Place into both nostrils as needed for allergies or rhinitis.  Current Outpatient Medications (Analgesics):    colchicine 0.6 MG tablet, Take twice daily for gout attack   Febuxostat 80 MG TABS, Take 1 tablet (80 mg total) by mouth daily.   Current Outpatient Medications (Other):    amitriptyline (ELAVIL) 10 MG tablet, Take 10 mg by mouth at bedtime.   famotidine (PEPCID) 20 MG tablet, Take 1 tablet (20 mg total) by mouth 2 (two) times daily.   omeprazole (PRILOSEC) 40 MG capsule, Take 40 mg by mouth every morning.   polyethylene glycol (MIRALAX / GLYCOLAX) packet, Take 17 g by mouth daily.   tamsulosin (FLOMAX) 0.4 MG CAPS capsule, Take 1 capsule (0.4 mg total) by mouth 2 (two) times daily.   Reviewed prior external information including notes and imaging from  primary care provider As well as notes that were available from care everywhere and other healthcare systems.  Past medical history, social, surgical and family history all reviewed in electronic medical record.  No pertanent information unless stated regarding to the chief complaint.   Review of Systems:  No  headache, visual changes, nausea, vomiting, diarrhea, constipation, dizziness, abdominal pain, skin rash, fevers, chills, night sweats, weight loss, swollen lymph nodes, body aches, joint swelling, chest pain, shortness of breath, mood changes. POSITIVE muscle aches  Objective  Blood pressure 122/88, pulse 87, height 5\' 10"  (1.778 m), weight 203 lb (92.1 kg), SpO2 96%.   General: No apparent distress alert and oriented x3 mood and affect normal, dressed appropriately.  HEENT: Pupils equal, extraocular movements intact  Respiratory: Patient's speak in full sentences and does not appear short of breath  Cardiovascular: No lower extremity edema, non tender, no erythema  Left knee exam shows significant effusion noted.  Patient does have some instability noted.  Positive McMurray's  noted.  After informed written and verbal consent, patient was seated on exam table. Left knee was prepped with alcohol swab and utilizing anterolateral approach, patient's left knee space was injected with 4:1  marcaine 0.5%: Kenalog 40mg /dL.  Patient did have aspiration of 30 cc of straw-colored fluid.  Patient tolerated the procedure well without immediate complications.  Osteopathic findings C6 flexed rotated and side bent left T3 extended rotated and side bent right inhaled third rib T7 extended rotated and side bent left L2 flexed rotated and side bent right Sacrum right on right   Impression and Recommendations:    The above documentation has been reviewed and is accurate and complete Judi Saa, DO

## 2023-03-01 ENCOUNTER — Ambulatory Visit: Payer: Medicare PPO | Admitting: Family Medicine

## 2023-03-01 ENCOUNTER — Encounter: Payer: Self-pay | Admitting: Family Medicine

## 2023-03-01 VITALS — BP 122/88 | HR 87 | Ht 70.0 in | Wt 203.0 lb

## 2023-03-01 DIAGNOSIS — M9902 Segmental and somatic dysfunction of thoracic region: Secondary | ICD-10-CM

## 2023-03-01 DIAGNOSIS — M9904 Segmental and somatic dysfunction of sacral region: Secondary | ICD-10-CM | POA: Diagnosis not present

## 2023-03-01 DIAGNOSIS — M9903 Segmental and somatic dysfunction of lumbar region: Secondary | ICD-10-CM

## 2023-03-01 DIAGNOSIS — M9908 Segmental and somatic dysfunction of rib cage: Secondary | ICD-10-CM

## 2023-03-01 DIAGNOSIS — G2589 Other specified extrapyramidal and movement disorders: Secondary | ICD-10-CM | POA: Diagnosis not present

## 2023-03-01 DIAGNOSIS — M23312 Other meniscus derangements, anterior horn of medial meniscus, left knee: Secondary | ICD-10-CM | POA: Diagnosis not present

## 2023-03-01 DIAGNOSIS — M9901 Segmental and somatic dysfunction of cervical region: Secondary | ICD-10-CM | POA: Diagnosis not present

## 2023-03-01 NOTE — Assessment & Plan Note (Signed)
Chronic problem with exacerbation.  Was doing significantly better initially after the aspiration 3 months ago.  Now having worsening pain and instability.  Discussed with patient about icing regimen and home exercises, which activities to do and which ones to avoid.  We discussed with patient that though if any more locking or giving out on him we do need to consider the possibility of

## 2023-03-01 NOTE — Patient Instructions (Addendum)
Injection in knee today Also drained knee Happy Holidays If not better in 2 weeks write and we'll consider MRI See you again in 6-8 weeks

## 2023-03-01 NOTE — Assessment & Plan Note (Signed)
Patient does have some tenderness to palpation in the paraspinal musculature, discussed icing regimen.  Discussed with patient about posture and ergonomics otherwise.  Increase activity slowly.  Follow-up with me again in 6 to 8 weeks.

## 2023-03-05 ENCOUNTER — Encounter: Payer: Self-pay | Admitting: Family Medicine

## 2023-03-21 ENCOUNTER — Encounter: Payer: Self-pay | Admitting: Family Medicine

## 2023-03-21 ENCOUNTER — Telehealth: Payer: Self-pay | Admitting: Family Medicine

## 2023-03-21 ENCOUNTER — Other Ambulatory Visit: Payer: Self-pay

## 2023-03-21 DIAGNOSIS — M25562 Pain in left knee: Secondary | ICD-10-CM

## 2023-03-21 DIAGNOSIS — N5201 Erectile dysfunction due to arterial insufficiency: Secondary | ICD-10-CM

## 2023-03-21 DIAGNOSIS — N401 Enlarged prostate with lower urinary tract symptoms: Secondary | ICD-10-CM

## 2023-03-21 NOTE — Telephone Encounter (Signed)
Pt was advised to let us know how he was doing, last OV 03/01/2023. Does not think knee has improved at all, wondering what next steps were, MRI?

## 2023-03-27 ENCOUNTER — Other Ambulatory Visit: Payer: Medicare PPO

## 2023-03-27 DIAGNOSIS — N5201 Erectile dysfunction due to arterial insufficiency: Secondary | ICD-10-CM | POA: Diagnosis not present

## 2023-03-27 DIAGNOSIS — N401 Enlarged prostate with lower urinary tract symptoms: Secondary | ICD-10-CM | POA: Diagnosis not present

## 2023-03-27 DIAGNOSIS — R3912 Poor urinary stream: Secondary | ICD-10-CM | POA: Diagnosis not present

## 2023-03-28 ENCOUNTER — Ambulatory Visit: Payer: Medicare PPO

## 2023-03-28 DIAGNOSIS — N1831 Chronic kidney disease, stage 3a: Secondary | ICD-10-CM | POA: Diagnosis not present

## 2023-03-28 DIAGNOSIS — R809 Proteinuria, unspecified: Secondary | ICD-10-CM | POA: Diagnosis not present

## 2023-03-28 DIAGNOSIS — N401 Enlarged prostate with lower urinary tract symptoms: Secondary | ICD-10-CM | POA: Diagnosis not present

## 2023-03-28 DIAGNOSIS — I129 Hypertensive chronic kidney disease with stage 1 through stage 4 chronic kidney disease, or unspecified chronic kidney disease: Secondary | ICD-10-CM | POA: Diagnosis not present

## 2023-03-28 LAB — PSA, TOTAL AND FREE
PSA, Free Pct: 42.9 %
PSA, Free: 0.3 ng/mL
Prostate Specific Ag, Serum: 0.7 ng/mL (ref 0.0–4.0)

## 2023-04-03 ENCOUNTER — Ambulatory Visit (INDEPENDENT_AMBULATORY_CARE_PROVIDER_SITE_OTHER): Payer: Medicare PPO | Admitting: Family Medicine

## 2023-04-03 VITALS — BP 98/56 | HR 120 | Temp 100.8°F | Ht 70.0 in | Wt 205.6 lb

## 2023-04-03 DIAGNOSIS — R509 Fever, unspecified: Secondary | ICD-10-CM | POA: Diagnosis not present

## 2023-04-03 DIAGNOSIS — J069 Acute upper respiratory infection, unspecified: Secondary | ICD-10-CM

## 2023-04-03 LAB — VERITOR FLU A/B WAIVED
Influenza A: NEGATIVE
Influenza B: NEGATIVE

## 2023-04-03 LAB — RSV AG, IMMUNOCHR, WAIVED: RSV Ag, Immunochr, Waived: NEGATIVE

## 2023-04-03 NOTE — Progress Notes (Signed)
Acute Office Visit  Subjective:     Patient ID: Matthew [name], male    DOB: [birth year], 67 y.o.   MRN: [id number]  Chief Complaint  Patient presents with   Fever    Fever  This is a new problem. Episode onset: 2 days. The problem has been gradually worsening. The maximum temperature noted was 102 to 102.9 F. Associated symptoms include abdominal pain, congestion, coughing, diarrhea (2 days ago), headaches, muscle aches, nausea and a sore throat. Pertinent negatives include no ear pain, vomiting or wheezing. Associated symptoms comments: Decreased appetite, fatigue. He has tried acetaminophen for the symptoms. The treatment provided mild relief.  Risk factors: no sick contacts    Had negative home Covid test 2 days ago. He has 4 grand kids and he regularly keeps.   Review of Systems  Constitutional:  Positive for fever.  HENT:  Positive for congestion and sore throat. Negative for ear pain.   Respiratory:  Positive for cough. Negative for wheezing.   Gastrointestinal:  Positive for abdominal pain, diarrhea (2 days ago) and nausea. Negative for vomiting.  Neurological:  Positive for headaches.        Objective:    BP (!) 98/56   Pulse (!) 120   Temp (!) 100.8 F (38.2 C) (Temporal)   Ht 5\' 10"  (1.778 m)   Wt 205 lb 9.6 oz (93.3 kg)   SpO2 97%   BMI 29.50 kg/m  BP Readings from Last 3 Encounters:  04/03/23 (!) 98/56  03/01/23 122/88  01/19/23 132/80      Physical Exam Vitals and nursing note reviewed.  Constitutional:      General: He is not in acute distress.    Appearance: He is ill-appearing. He is not toxic-appearing or diaphoretic.  Cardiovascular:     Rate and Rhythm: Normal rate and regular rhythm.     Heart sounds: Normal heart sounds. No murmur heard. Pulmonary:     Effort: Pulmonary effort is normal. No respiratory distress.     Breath sounds: Normal breath sounds. No wheezing, rhonchi or rales.  Abdominal:     General: Bowel sounds are normal.  There is no distension.     Palpations: Abdomen is soft.     Tenderness: There is no abdominal tenderness. There is no guarding or rebound.  Musculoskeletal:     Cervical back: Neck supple. No rigidity.     Right lower leg: No edema.     Left lower leg: No edema.  Skin:    General: Skin is warm and dry.  Neurological:     General: No focal deficit present.     Mental Status: He is alert and oriented to person, place, and time.  Psychiatric:        Mood and Affect: Mood normal.        Behavior: Behavior normal.     No results found for any visits on 04/03/23.      Assessment & Plan:   Akxel was seen today for fever.  Diagnoses and all orders for this visit:  Fever in adult -     Veritor Flu A/B Waived -     RSV Ag, Immunochr, Waived  Viral URI with cough   Negative flu and RSV today. He will repeat a Covid test at home today. Discussed symptomatic care and importance of hydration. Return to office for new or worsening symptoms, or if symptoms persist.   The patient indicates understanding of these issues and agrees with the  plan.  Gabriel Earing, FNP

## 2023-04-07 ENCOUNTER — Other Ambulatory Visit: Payer: Medicare PPO

## 2023-04-09 ENCOUNTER — Ambulatory Visit
Admission: RE | Admit: 2023-04-09 | Discharge: 2023-04-09 | Disposition: A | Discharge status: Home / Self Care | Payer: Medicare PPO | Source: Ambulatory Visit | Attending: Family Medicine | Admitting: Family Medicine

## 2023-04-09 DIAGNOSIS — M175 Other unilateral secondary osteoarthritis of knee: Secondary | ICD-10-CM | POA: Diagnosis not present

## 2023-04-09 DIAGNOSIS — S83231A Complex tear of medial meniscus, current injury, right knee, initial encounter: Secondary | ICD-10-CM | POA: Diagnosis not present

## 2023-04-09 DIAGNOSIS — M25562 Pain in left knee: Secondary | ICD-10-CM

## 2023-04-09 DIAGNOSIS — M25462 Effusion, left knee: Secondary | ICD-10-CM | POA: Diagnosis not present

## 2023-04-16 ENCOUNTER — Other Ambulatory Visit: Payer: Self-pay | Admitting: Family Medicine

## 2023-04-16 DIAGNOSIS — E349 Endocrine disorder, unspecified: Secondary | ICD-10-CM

## 2023-04-24 ENCOUNTER — Encounter: Payer: Self-pay | Admitting: Family Medicine

## 2023-04-24 DIAGNOSIS — E349 Endocrine disorder, unspecified: Secondary | ICD-10-CM

## 2023-04-24 MED ORDER — TESTOSTERONE 20.25 MG/ACT (1.62%) TD GEL: 2.0000 | Freq: Every day | TRANSDERMAL | 3 refills | Status: DC

## 2023-04-26 NOTE — Progress Notes (Signed)
[name] [address] [place] [address] Phone: [phone] Subjective:   Bruce Donath, am serving as a scribe for Dr. Antoine Primas.  I'm seeing this patient by the request  of:  Dettinger, [name], MD  CC: Left knee pain, back pain follow-up  [id number]  HELIOS GELTZ is a 68 y.o. male coming in with complaint of L knee pain. Patient states that day to day he is able to mange his pain if he is not twisting. Stiffness in mornings.   L knee MRI  04/09/2023 IMPRESSION: 1. Complex tear of the body and posterior horn of the medial meniscus with a radial component and extrusion into the medial gutter. 2. Grade 1 MCL sprain. 3. 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 4. Moderate-sized joint effusion with findings suggestive of synovitis.     Past Medical History:  Diagnosis Date   Allergic rhinitis    BPH (benign prostatic hyperplasia)    Chronic constipation    ED (erectile dysfunction)    GERD (gastroesophageal reflux disease)    Gout    Heme positive stool    history of, normal colonoscopy   History of sleep apnea    corrected after T&A procedure   Hyperlipidemia    Hypertension    Microscopic hematuria    Nocturia    Urinary catheter in place    2018 x one month   Past Surgical History:  Procedure Laterality Date   COLONOSCOPY  06/2017   laser vaporization of prostate     SHOULDER SURGERY Right    THULIUM LASER TURP (TRANSURETHRAL RESECTION OF PROSTATE) N/A 12/26/2017   Procedure: THULIUM LASER TURP (TRANSURETHRAL RESECTION OF PROSTATE);  Surgeon: Jerilee Field, MD;  Location: [facility];  Service: Urology;  Laterality: N/A;   TONSILLECTOMY AND ADENOIDECTOMY  2009   UPPER GI ENDOSCOPY     Social History   Socioeconomic History   Marital status: Married    Spouse name: Not on file   Number of children: Not on file   Years of education: Not on file    Highest education level: Master's degree (e.g., MA, MS, MEng, MEd, MSW, MBA)  Occupational History   Not on file  Tobacco Use   Smoking status: Never   Smokeless tobacco: Never  Vaping Use   Vaping status: Never Used  Substance and Sexual Activity   Alcohol use: Yes    Comment: 2 per month   Drug use: No   Sexual activity: Not on file  Other Topics Concern   Not on file  Social History Narrative   Not on file   Social Drivers of Health   Financial Resource Strain: Low Risk  (04/03/2023)   Overall Financial Resource Strain (CARDIA)    Difficulty of Paying Living Expenses: Not hard at all  Food Insecurity: No Food Insecurity (04/03/2023)   Hunger Vital Sign    Worried About Running Out of Food in the Last Year: Never true    Ran Out of Food in the Last Year: Never true  Transportation Needs: No Transportation Needs (04/03/2023)   PRAPARE - Administrator, Civil Service (Medical): No    Lack of Transportation (Non-Medical): No  Physical Activity: Insufficiently Active (04/03/2023)   Exercise Vital Sign    Days of Exercise per Week: 3 days    Minutes of Exercise per Session: 30 min  Stress: No Stress Concern Present (04/03/2023)   [employer]  of Occupational Health - Occupational Stress Questionnaire    Feeling of Stress : Only a little  Social Connections: Socially Integrated (04/03/2023)   Social Connection and Isolation Panel [NHANES]    Frequency of Communication with Friends and Family: More than three times a week    Frequency of Social Gatherings with Friends and Family: More than three times a week    Attends Religious Services: 1 to 4 times per year    Active Member of [employer] or Organizations: Yes    Attends Banker Meetings: 1 to 4 times per year    Marital Status: Married   No Known Allergies Family History  Problem Relation Age of Onset   Cancer Mother    Heart attack Father 34       Died age 13   Prostate cancer Paternal Uncle      Current Outpatient Medications (Endocrine & Metabolic):    Testosterone 20.25 MG/ACT (1.62%) GEL, Apply 2 Pump topically daily.  Current Outpatient Medications (Cardiovascular):    benazepril (LOTENSIN) 20 MG tablet, Take 1 tablet (20 mg total) by mouth daily.   rosuvastatin (CRESTOR) 20 MG tablet, Take 1 tablet (20 mg total) by mouth daily.   sildenafil (REVATIO) 20 MG tablet, Take 1-5 tablets (20-100 mg total) by mouth daily as needed.   tadalafil (CIALIS) 20 MG tablet, Take 1 tablet (20 mg total) by mouth daily as needed for erectile dysfunction.  Current Outpatient Medications (Respiratory):    fluticasone (FLONASE) 50 MCG/ACT nasal spray, Place into both nostrils as needed for allergies or rhinitis.  Current Outpatient Medications (Analgesics):    colchicine 0.6 MG tablet, Take twice daily for gout attack   Febuxostat 80 MG TABS, Take 1 tablet (80 mg total) by mouth daily.   Current Outpatient Medications (Other):    amitriptyline (ELAVIL) 10 MG tablet, Take 10 mg by mouth at bedtime.   famotidine (PEPCID) 20 MG tablet, Take 1 tablet (20 mg total) by mouth 2 (two) times daily.   omeprazole (PRILOSEC) 40 MG capsule, Take 40 mg by mouth every morning.   polyethylene glycol (MIRALAX / GLYCOLAX) packet, Take 17 g by mouth daily.   tamsulosin (FLOMAX) 0.4 MG CAPS capsule, Take 1 capsule (0.4 mg total) by mouth 2 (two) times daily.   Reviewed prior external information including notes and imaging from  primary care provider As well as notes that were available from care everywhere and other healthcare systems.  Past medical history, social, surgical and family history all reviewed in electronic medical record.  No pertanent information unless stated regarding to the chief complaint.   Review of Systems:  No headache, visual changes, nausea, vomiting, diarrhea, constipation, dizziness, abdominal pain, skin rash, fevers, chills, night sweats, weight loss, swollen lymph nodes, body  aches, joint swelling, chest pain, shortness of breath, mood changes. POSITIVE muscle aches  Objective  Blood pressure 110/76, pulse 83, height 5\' 10"  (1.778 m), SpO2 98%.   General: No apparent distress alert and oriented x3 mood and affect normal, dressed appropriately.  HEENT: Pupils equal, extraocular movements intact  Respiratory: Patient's speak in full sentences and does not appear short of breath  Cardiovascular: No lower extremity edema, non tender, no erythema  Antalgic gait noted.  Left knee does have some tenderness to palpation noted.  Positive McMurray's noted.  Tender to palpation of the medial and lateral joint line and does have effusion noted.  Scoliosis of the thoracic spine noted.  Osteopathic findings C2 flexed rotated and side  bent right C4 flexed rotated and side bent left C6 flexed rotated and side bent left T3 extended rotated and side bent right inhaled third rib T9 extended rotated and side bent left L2 flexed rotated and side bent right Sacrum right on right   .  After informed written and verbal consent, patient was seated on exam table. Left knee was prepped with alcohol swab and utilizing anterolateral approach, patient's left knee space was injected with 4:1  marcaine 0.5%: Kenalog 40mg /dL. Patient tolerated the procedure well without immediate complications.    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  Patient would like to consider surgical intervention but does not know if he wants to do it until the summer.  Does work with the school systems.  We discussed with patient's about avoiding twisting motions, another injection given today, increase activity slowly.  Follow-up with me again as needed for this problem.  Scapular dyskinesis Scapular dyskinesis noted.  Discussed which activities to  do and which ones to avoid.  Increase activity slowly otherwise.  Responding well to osteopathic manipulation.  Follow-up again in 2 to 3 months for clinical evaluation.  Exacerbation secondary to patient's antalgic gait    Decision today to treat with OMT was based on Physical Exam  After verbal consent patient was treated with HVLA, ME, FPR techniques in cervical, thoracic, rib, lumbar and sacral areas, all areas are chronic   Patient tolerated the procedure well with improvement in symptoms  Patient given exercises, stretches and lifestyle modifications  See medications in patient instructions if given  Patient will follow up in 4-8 weeks  The above documentation has been reviewed and is accurate and complete Judi Saa, DO

## 2023-04-27 ENCOUNTER — Encounter: Payer: Self-pay | Admitting: Family Medicine

## 2023-04-27 ENCOUNTER — Ambulatory Visit: Payer: Medicare PPO | Admitting: Family Medicine

## 2023-04-27 VITALS — BP 110/76 | HR 83 | Ht 70.0 in

## 2023-04-27 DIAGNOSIS — M9908 Segmental and somatic dysfunction of rib cage: Secondary | ICD-10-CM

## 2023-04-27 DIAGNOSIS — M23312 Other meniscus derangements, anterior horn of medial meniscus, left knee: Secondary | ICD-10-CM

## 2023-04-27 DIAGNOSIS — M9904 Segmental and somatic dysfunction of sacral region: Secondary | ICD-10-CM | POA: Diagnosis not present

## 2023-04-27 DIAGNOSIS — M9901 Segmental and somatic dysfunction of cervical region: Secondary | ICD-10-CM

## 2023-04-27 DIAGNOSIS — M9902 Segmental and somatic dysfunction of thoracic region: Secondary | ICD-10-CM | POA: Diagnosis not present

## 2023-04-27 DIAGNOSIS — M9903 Segmental and somatic dysfunction of lumbar region: Secondary | ICD-10-CM

## 2023-04-27 DIAGNOSIS — G2589 Other specified extrapyramidal and movement disorders: Secondary | ICD-10-CM

## 2023-04-27 NOTE — Patient Instructions (Addendum)
Injected knee today Good to see you Referral to Dr. Jerl Santos See me again in 10 weeks

## 2023-04-27 NOTE — Assessment & Plan Note (Signed)
Discussed with patient that MRI does show the complex tearing of the meniscus at this point.  Patient would like to consider surgical intervention but does not know if he wants to do it until the summer.  Does work with the school systems.  We discussed with patient's about avoiding twisting motions, another injection given today, increase activity slowly.  Follow-up with me again as needed for this problem.

## 2023-04-27 NOTE — Assessment & Plan Note (Signed)
Scapular dyskinesis noted.  Discussed which activities to do and which ones to avoid.  Increase activity slowly otherwise.  Responding well to osteopathic manipulation.  Follow-up again in 2 to 3 months for clinical evaluation.  Exacerbation secondary to patient's antalgic gait

## 2023-05-01 ENCOUNTER — Telehealth: Payer: Self-pay

## 2023-05-01 ENCOUNTER — Other Ambulatory Visit (HOSPITAL_COMMUNITY): Payer: Self-pay

## 2023-05-01 NOTE — Telephone Encounter (Signed)
Pharmacy Patient Advocate Encounter   Received notification from Pt Calls Messages that prior authorization for Testosterone 1.62% gel is required/requested.   Insurance verification completed.   The patient is insured through [place] .   Per test claim: PA required; PA submitted to above mentioned insurance via CoverMyMeds Key/confirmation #/EOC [id number] Status is pending

## 2023-05-02 ENCOUNTER — Other Ambulatory Visit (HOSPITAL_COMMUNITY): Payer: Self-pay

## 2023-05-02 NOTE — Telephone Encounter (Signed)
Pharmacy Patient Advocate Encounter  Received notification from [facility] that Prior Authorization for Testosterone 1.62% gel  has been APPROVED from 04/12/23 to 04/10/24. Unable to obtain price due to refill too soon rejection, last fill date 05/02/23 next available fill date02/13/25   PA #/Case ID/Reference #: [id number]

## 2023-05-24 ENCOUNTER — Other Ambulatory Visit (HOSPITAL_COMMUNITY): Payer: Self-pay

## 2023-06-26 ENCOUNTER — Ambulatory Visit: Payer: Medicare PPO | Admitting: Urology

## 2023-07-06 ENCOUNTER — Ambulatory Visit: Payer: Medicare PPO | Admitting: Family Medicine

## 2023-07-11 ENCOUNTER — Other Ambulatory Visit: Payer: Self-pay

## 2023-07-11 ENCOUNTER — Other Ambulatory Visit: Payer: Self-pay | Admitting: Family Medicine

## 2023-07-11 DIAGNOSIS — R339 Retention of urine, unspecified: Secondary | ICD-10-CM

## 2023-07-11 MED ORDER — TAMSULOSIN HCL 0.4 MG PO CAPS: 0.4000 mg | ORAL_CAPSULE | Freq: Two times a day (BID) | ORAL | 0 refills | Status: DC

## 2023-07-12 ENCOUNTER — Other Ambulatory Visit: Payer: Self-pay | Admitting: Family Medicine

## 2023-07-20 ENCOUNTER — Encounter: Payer: Medicare PPO | Admitting: Family Medicine

## 2023-08-22 IMAGING — CT CT ABD-PELV W/ CM
2 of 5 series · 16 of 46 positions shown, 18 images · IV contrast (APPLIED)
Comparison: CT abdomen pelvis 11/20/2017

CLINICAL DATA: Epigastric pain Persistent abdominal pain epigastric

EXAM:
CT ABDOMEN AND PELVIS WITH CONTRAST
TECHNIQUE: Multidetector CT imaging of the abdomen and pelvis was performed
using the standard protocol following bolus administration of
intravenous contrast.

[Series 2: abd pel w · axial · 0.90mm/px · z∈[-378,+32]mm · 13 of 94 slices shown, 15 images]
[im 6/94  soft-tissue]
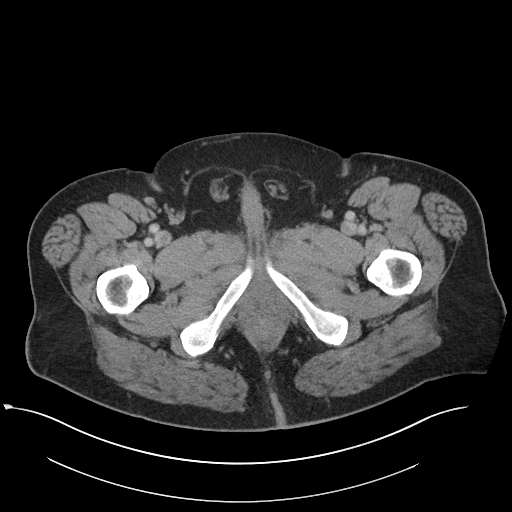
[im 6/94  bone]
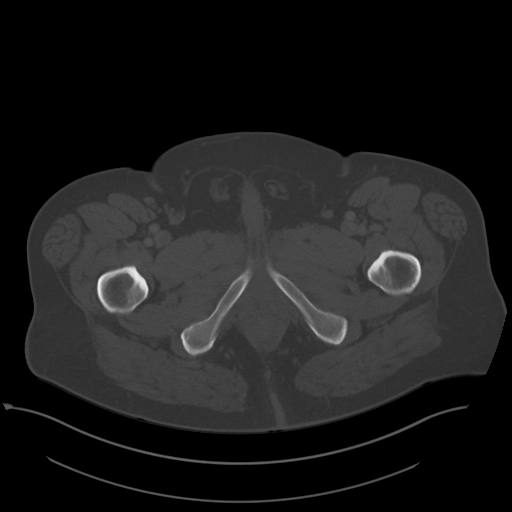
[im 11/94  soft-tissue]
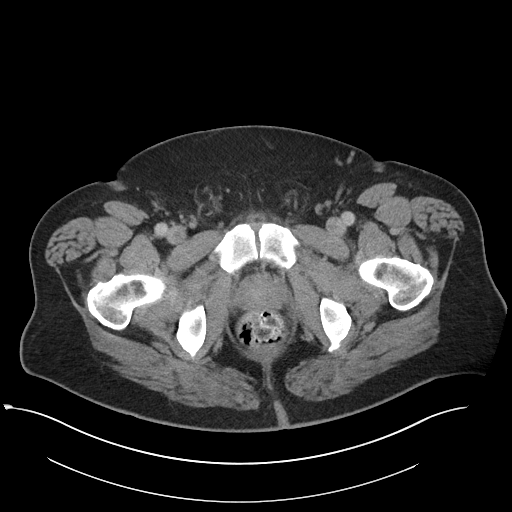
[im 22/94  soft-tissue]
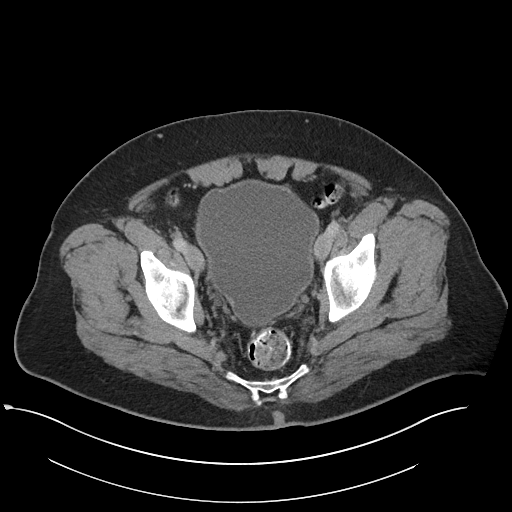
[im 28/94  soft-tissue]
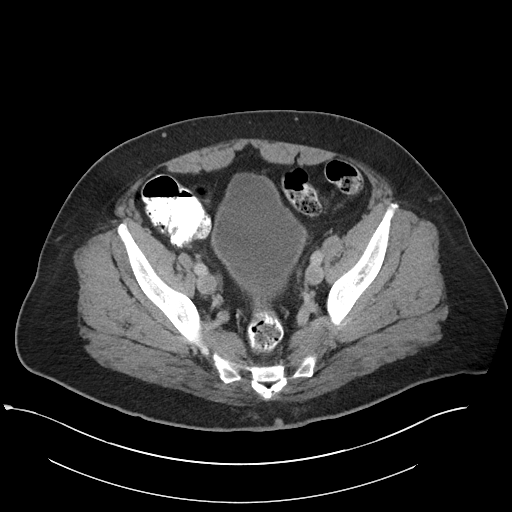
[im 33/94  soft-tissue]
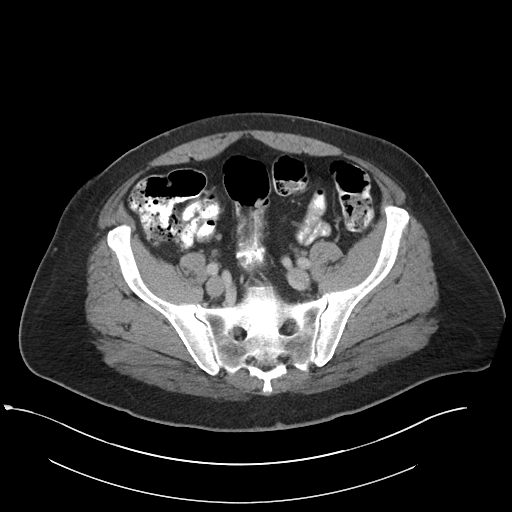
[im 39/94  soft-tissue]
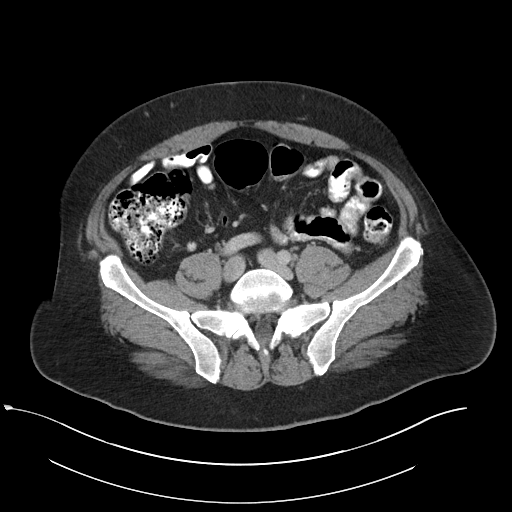
[im 50/94  soft-tissue]
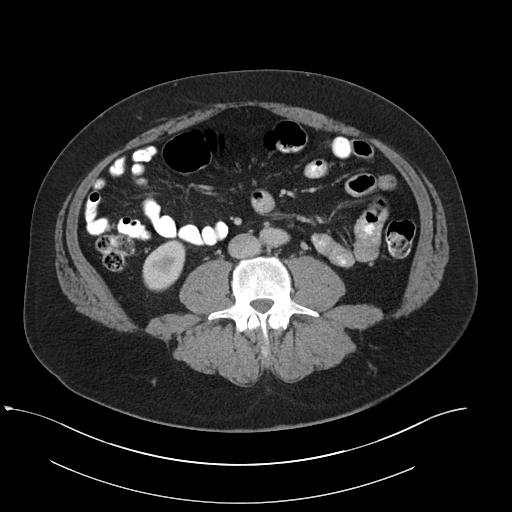
[im 55/94  soft-tissue]
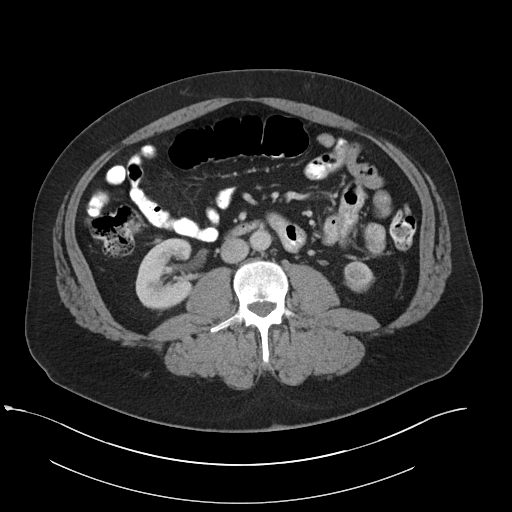
[im 61/94  soft-tissue]
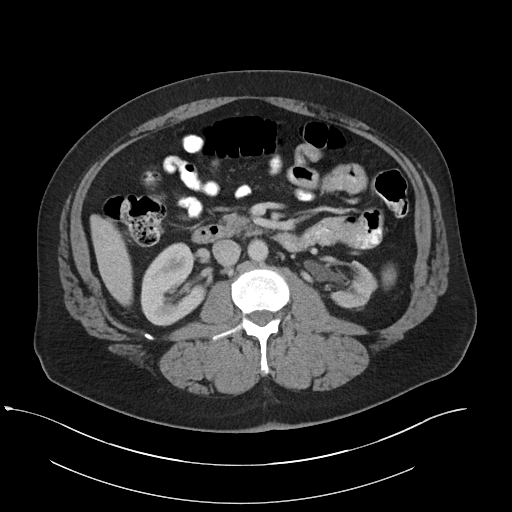
[im 61/94  bone]
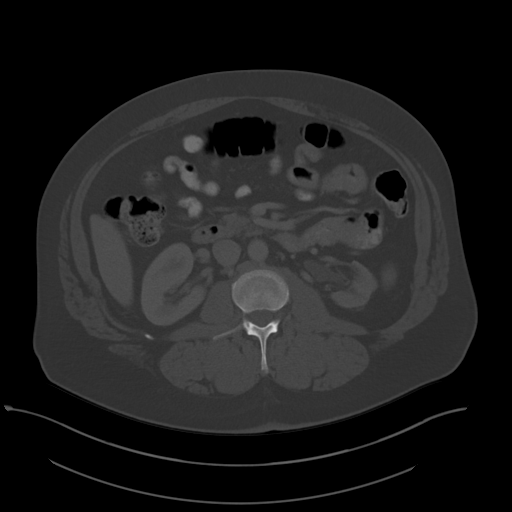
[im 66/94  soft-tissue]
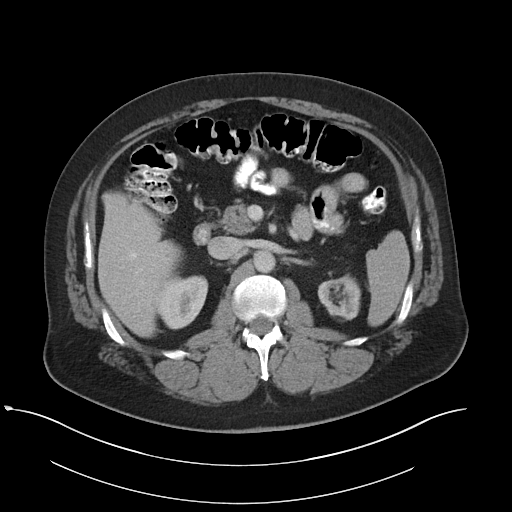
[im 72/94  soft-tissue]
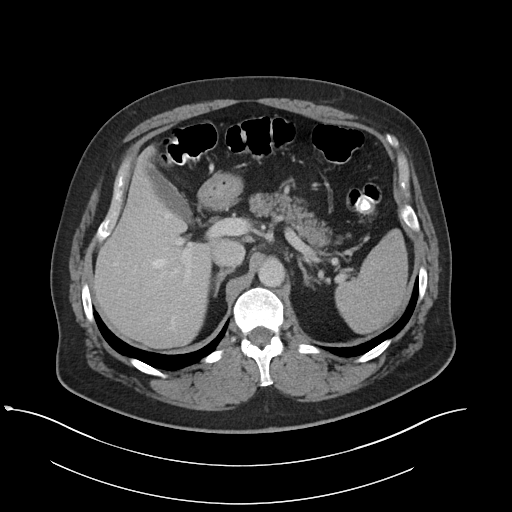
[im 83/94  soft-tissue]
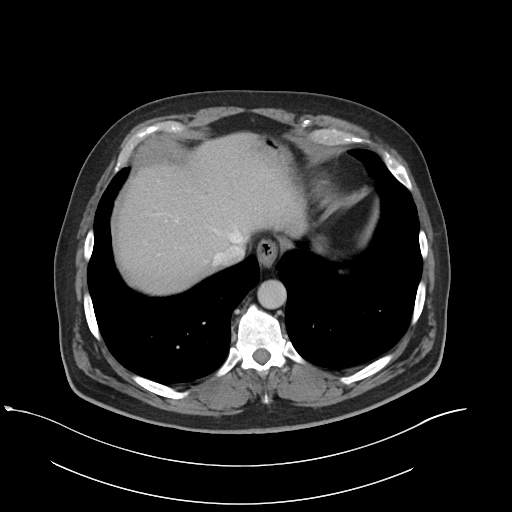
[im 88/94  soft-tissue]
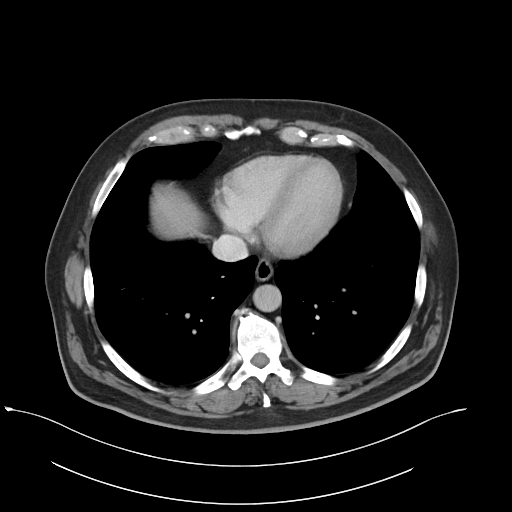

[Series 5: coronal · coronal · 0.88mm/px · 3 of 119 slices shown]
[im 40/119  soft-tissue]
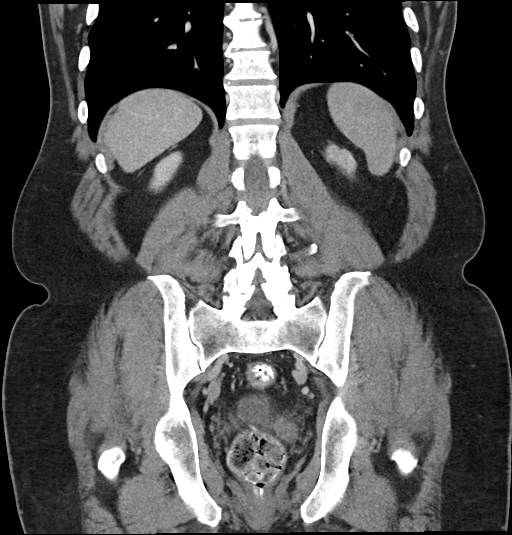
[im 53/119  soft-tissue]
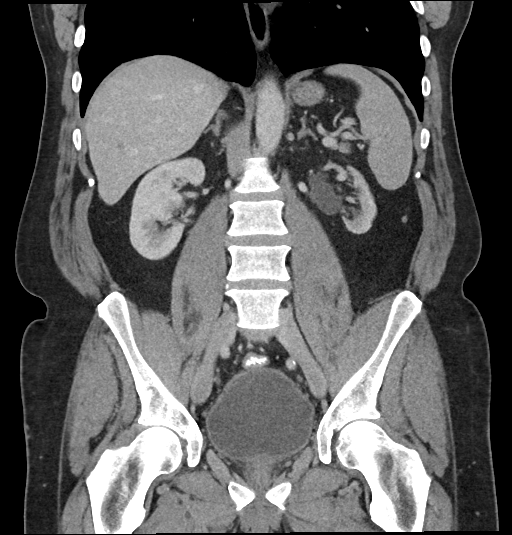
[im 66/119  soft-tissue]
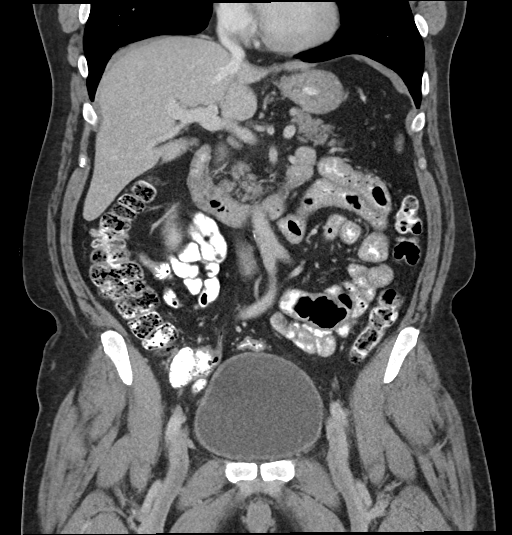

[16 of 46 positions shown; findings below may reference images not displayed]

RADIATION DOSE REDUCTION: This exam was performed according to the
departmental dose-optimization program which includes automated
exposure control, adjustment of the mA and/or kV according to
patient size and/or use of iterative reconstruction technique.

CONTRAST:  85mL OMNIPAQUE IOHEXOL 300 MG/ML  SOLN
FINDINGS: Lower chest: No acute abnormality.  Query tiny hiatal hernia.

Hepatobiliary: No focal liver abnormality. No gallstones,
gallbladder wall thickening, or pericholecystic fluid. No biliary
dilatation.

Pancreas: No focal lesion. Normal pancreatic contour. No surrounding
inflammatory changes. No main pancreatic ductal dilatation.

Spleen: Normal in size without focal abnormality.

Adrenals/Urinary Tract:

No adrenal nodule bilaterally.

Scarring and atrophy of the left kidney. Bilateral kidneys enhance
symmetrically.

No hydronephrosis. No hydroureter.

The urinary bladder is unremarkable.

On delayed imaging, there is no urothelial wall thickening and there
are no filling defects in the opacified portions of the bilateral
collecting systems or ureters.

Stomach/Bowel: Stomach is within normal limits. No evidence of bowel
wall thickening or dilatation. Redundant sigmoid colon. Appendix
appears normal.

Vascular/Lymphatic: No abdominal aorta or iliac aneurysm. Mild
atherosclerotic plaque of the aorta and its branches. No abdominal,
pelvic, or inguinal lymphadenopathy.

Reproductive: Prostate is unremarkable.

Other: No intraperitoneal free fluid. No intraperitoneal free gas.
No organized fluid collection.

Musculoskeletal:

No abdominal wall hernia or abnormality.

No suspicious lytic or blastic osseous lesions. No acute displaced
fracture.
IMPRESSION: 1. Query tiny hiatal hernia.
2. No acute intra-abdominal or intrapelvic abnormality.
3.  Aortic Atherosclerosis ([id number]).

## 2023-08-25 ENCOUNTER — Encounter: Admitting: Family Medicine

## 2023-08-28 ENCOUNTER — Ambulatory Visit: Admitting: Urology

## 2023-09-06 ENCOUNTER — Other Ambulatory Visit: Payer: Self-pay | Admitting: Family Medicine

## 2023-09-06 DIAGNOSIS — E349 Endocrine disorder, unspecified: Secondary | ICD-10-CM

## 2023-09-14 ENCOUNTER — Encounter: Payer: Self-pay | Admitting: Family Medicine

## 2023-09-14 ENCOUNTER — Other Ambulatory Visit

## 2023-09-14 DIAGNOSIS — N189 Chronic kidney disease, unspecified: Secondary | ICD-10-CM | POA: Diagnosis not present

## 2023-09-14 DIAGNOSIS — R809 Proteinuria, unspecified: Secondary | ICD-10-CM | POA: Diagnosis not present

## 2023-09-14 DIAGNOSIS — E785 Hyperlipidemia, unspecified: Secondary | ICD-10-CM | POA: Diagnosis not present

## 2023-09-14 DIAGNOSIS — R3912 Poor urinary stream: Secondary | ICD-10-CM | POA: Diagnosis not present

## 2023-09-14 DIAGNOSIS — E349 Endocrine disorder, unspecified: Secondary | ICD-10-CM

## 2023-09-14 DIAGNOSIS — N401 Enlarged prostate with lower urinary tract symptoms: Secondary | ICD-10-CM | POA: Diagnosis not present

## 2023-09-14 DIAGNOSIS — N1832 Chronic kidney disease, stage 3b: Secondary | ICD-10-CM | POA: Diagnosis not present

## 2023-09-14 DIAGNOSIS — D631 Anemia in chronic kidney disease: Secondary | ICD-10-CM | POA: Diagnosis not present

## 2023-09-14 DIAGNOSIS — R7309 Other abnormal glucose: Secondary | ICD-10-CM | POA: Diagnosis not present

## 2023-09-14 DIAGNOSIS — E119 Type 2 diabetes mellitus without complications: Secondary | ICD-10-CM | POA: Diagnosis not present

## 2023-09-14 DIAGNOSIS — I10 Essential (primary) hypertension: Secondary | ICD-10-CM | POA: Diagnosis not present

## 2023-09-14 LAB — LIPID PANEL

## 2023-09-14 LAB — BAYER DCA HB A1C WAIVED: HB A1C (BAYER DCA - WAIVED): 5.6 % (ref 4.8–5.6)

## 2023-09-18 ENCOUNTER — Ambulatory Visit (INDEPENDENT_AMBULATORY_CARE_PROVIDER_SITE_OTHER): Admitting: Family Medicine

## 2023-09-18 ENCOUNTER — Encounter: Payer: Self-pay | Admitting: Family Medicine

## 2023-09-18 VITALS — BP 136/83 | HR 79 | Ht 70.0 in | Wt 197.0 lb

## 2023-09-18 DIAGNOSIS — E785 Hyperlipidemia, unspecified: Secondary | ICD-10-CM | POA: Diagnosis not present

## 2023-09-18 DIAGNOSIS — M1000 Idiopathic gout, unspecified site: Secondary | ICD-10-CM

## 2023-09-18 DIAGNOSIS — R339 Retention of urine, unspecified: Secondary | ICD-10-CM | POA: Diagnosis not present

## 2023-09-18 DIAGNOSIS — N1832 Chronic kidney disease, stage 3b: Secondary | ICD-10-CM | POA: Diagnosis not present

## 2023-09-18 DIAGNOSIS — Z0001 Encounter for general adult medical examination with abnormal findings: Secondary | ICD-10-CM | POA: Diagnosis not present

## 2023-09-18 DIAGNOSIS — I10 Essential (primary) hypertension: Secondary | ICD-10-CM | POA: Diagnosis not present

## 2023-09-18 DIAGNOSIS — R1013 Epigastric pain: Secondary | ICD-10-CM | POA: Diagnosis not present

## 2023-09-18 LAB — CBC WITH DIFFERENTIAL/PLATELET
Basophils Absolute: 0 10*3/uL (ref 0.0–0.2)
Basos: 1 %
EOS (ABSOLUTE): 0.1 10*3/uL (ref 0.0–0.4)
Eos: 2 %
Hematocrit: 47.1 % (ref 37.5–51.0)
Hemoglobin: 14.7 g/dL (ref 13.0–17.7)
Immature Grans (Abs): 0 10*3/uL (ref 0.0–0.1)
Immature Granulocytes: 0 %
Lymphocytes Absolute: 1.7 10*3/uL (ref 0.7–3.1)
Lymphs: 42 %
MCH: 26.9 pg (ref 26.6–33.0)
MCHC: 31.2 g/dL — ABNORMAL LOW (ref 31.5–35.7)
MCV: 86 fL (ref 79–97)
Monocytes Absolute: 0.3 10*3/uL (ref 0.1–0.9)
Monocytes: 8 %
Neutrophils Absolute: 2 10*3/uL (ref 1.4–7.0)
Neutrophils: 47 %
Platelets: 245 10*3/uL (ref 150–450)
RBC: 5.46 x10E6/uL (ref 4.14–5.80)
RDW: 13.3 % (ref 11.6–15.4)
WBC: 4.1 10*3/uL (ref 3.4–10.8)

## 2023-09-18 LAB — LIPID PANEL
Chol/HDL Ratio: 3 ratio (ref 0.0–5.0)
Cholesterol, Total: 118 mg/dL (ref 100–199)
HDL: 40 mg/dL (ref >39)
LDL Chol Calc (NIH): 60 mg/dL (ref 0–99)
Triglycerides: 96 mg/dL (ref 0–149)
VLDL Cholesterol Cal: 18 mg/dL (ref 5–40)

## 2023-09-18 LAB — PSA, TOTAL AND FREE
PSA, Free Pct: 50 %
PSA, Free: 0.35 ng/mL
Prostate Specific Ag, Serum: 0.7 ng/mL (ref 0.0–4.0)

## 2023-09-18 LAB — CMP14+EGFR
ALT: 15 IU/L (ref 0–44)
AST: 26 IU/L (ref 0–40)
Albumin: 4.5 g/dL (ref 3.9–4.9)
Alkaline Phosphatase: 55 IU/L (ref 44–121)
BUN/Creatinine Ratio: 13 (ref 10–24)
BUN: 20 mg/dL (ref 8–27)
Bilirubin Total: 0.3 mg/dL (ref 0.0–1.2)
CO2: 23 mmol/L (ref 20–29)
Calcium: 9.5 mg/dL (ref 8.6–10.2)
Chloride: 103 mmol/L (ref 96–106)
Creatinine, Ser: 1.6 mg/dL — ABNORMAL HIGH (ref 0.76–1.27)
Globulin, Total: 2.2 g/dL (ref 1.5–4.5)
Glucose: 90 mg/dL (ref 70–99)
Potassium: 5.3 mmol/L — ABNORMAL HIGH (ref 3.5–5.2)
Sodium: 142 mmol/L (ref 134–144)
Total Protein: 6.7 g/dL (ref 6.0–8.5)
eGFR: 47 mL/min/{1.73_m2} — ABNORMAL LOW (ref >59)

## 2023-09-18 LAB — TSH: TSH: 1.96 u[IU]/mL (ref 0.450–4.500)

## 2023-09-18 MED ORDER — FEBUXOSTAT 80 MG PO TABS: 1.0000 | ORAL_TABLET | Freq: Every day | ORAL | 3 refills | Status: AC

## 2023-09-18 MED ORDER — ROSUVASTATIN CALCIUM 20 MG PO TABS: 20.0000 mg | ORAL_TABLET | Freq: Every day | ORAL | 3 refills | Status: AC

## 2023-09-18 MED ORDER — TAMSULOSIN HCL 0.4 MG PO CAPS: 0.4000 mg | ORAL_CAPSULE | Freq: Two times a day (BID) | ORAL | 3 refills | Status: AC

## 2023-09-18 MED ORDER — BENAZEPRIL HCL 20 MG PO TABS: 20.0000 mg | ORAL_TABLET | Freq: Every day | ORAL | 3 refills | Status: DC

## 2023-09-18 MED ORDER — FAMOTIDINE 20 MG PO TABS: 20.0000 mg | ORAL_TABLET | Freq: Two times a day (BID) | ORAL | 3 refills | Status: AC

## 2023-09-18 NOTE — Progress Notes (Signed)
 BP 136/83   Pulse 79   Ht 5\' 10"  (1.778 m)   Wt 197 lb (89.4 kg)   SpO2 95%   BMI 28.27 kg/m    Subjective:   Patient ID: Matthew [name], male    DOB: September 26, 1955, 68 y.o.   MRN: [id number]  HPI: Matthew [name] is a 68 y.o. male presenting on 09/18/2023 for Medical Management of Chronic Issues (CPE), Hypertension, and testosterone  deficiency   HPI Physical exam Patient denies any chest pain, shortness of breath, headaches or vision issues, abdominal complaints, diarrhea, nausea, vomiting, or joint issues.   Hypertension and CKD 3B Patient is currently on benazepril , and their blood pressure today is 136/83. Patient denies any lightheadedness or dizziness. Patient denies headaches, blurred vision, chest pains, shortness of breath, or weakness. Denies any side effects from medication and is content with current medication.   Hyperlipidemia Patient is coming in for recheck of his hyperlipidemia. The patient is currently taking Crestor . They deny any issues with myalgias or history of liver damage from it. They deny any focal numbness or weakness or chest pain.   Gout Last attack: Well over a year ago Attacks this year: None Medication: Uloric  Location of attacks: Feet  Relevant past medical, surgical, family and social history reviewed and updated as indicated. Interim medical history since our last visit reviewed. Allergies and medications reviewed and updated.  Review of Systems  Constitutional:  Negative for chills and fever.  HENT:  Negative for ear pain and tinnitus.   Eyes:  Negative for pain and discharge.  Respiratory:  Negative for cough, shortness of breath and wheezing.   Cardiovascular:  Negative for chest pain, palpitations and leg swelling.  Gastrointestinal:  Negative for abdominal pain, blood in stool, constipation and diarrhea.  Genitourinary:  Negative for dysuria and hematuria.  Musculoskeletal:  Negative for back pain, gait problem and myalgias.  Skin:   Negative for rash.  Neurological:  Negative for dizziness, weakness and headaches.  Psychiatric/Behavioral:  Negative for suicidal ideas.   All other systems reviewed and are negative.   Per HPI unless specifically indicated above   Allergies as of 09/18/2023   No Known Allergies      Medication List        Accurate as of September 18, 2023  2:58 PM. If you have any questions, ask your nurse or doctor.          STOP taking these medications    polyethylene glycol 17 g packet Commonly known as: MIRALAX / GLYCOLAX Stopped by: Lucio Sabin Suella Cogar       TAKE these medications    amitriptyline 10 MG tablet Commonly known as: ELAVIL Take 10 mg by mouth at bedtime.   benazepril  20 MG tablet Commonly known as: LOTENSIN  Take 1 tablet (20 mg total) by mouth daily.   colchicine  0.6 MG tablet Take twice daily for gout attack   famotidine  20 MG tablet Commonly known as: PEPCID  Take 1 tablet (20 mg total) by mouth 2 (two) times daily.   Febuxostat  80 MG Tabs Take 1 tablet (80 mg total) by mouth daily.   fluticasone  50 MCG/ACT nasal spray Commonly known as: FLONASE  Place into both nostrils as needed for allergies or rhinitis.   omeprazole  40 MG capsule Commonly known as: PRILOSEC Take 40 mg by mouth every morning.   rosuvastatin  20 MG tablet Commonly known as: CRESTOR  Take 1 tablet (20 mg total) by mouth daily.   sildenafil  20 MG tablet Commonly known  as: REVATIO  Take 1-5 tablets (20-100 mg total) by mouth daily as needed.   tadalafil  20 MG tablet Commonly known as: CIALIS  Take 1 tablet (20 mg total) by mouth daily as needed for erectile dysfunction.   tamsulosin  0.4 MG Caps capsule Commonly known as: FLOMAX  Take 1 capsule (0.4 mg total) by mouth 2 (two) times daily.   Testosterone  20.25 MG/ACT (1.62%) Gel APPLY 2 PUMP TOPICALLY DAILY.         Objective:   BP 136/83   Pulse 79   Ht 5\' 10"  (1.778 m)   Wt 197 lb (89.4 kg)   SpO2 95%   BMI 28.27 kg/m    Wt Readings from Last 3 Encounters:  09/18/23 197 lb (89.4 kg)  04/03/23 205 lb 9.6 oz (93.3 kg)  03/01/23 203 lb (92.1 kg)    Physical Exam Vitals and nursing note reviewed.  Constitutional:      General: He is not in acute distress.    Appearance: He is well-developed. He is not diaphoretic.  HENT:     Right Ear: External ear normal.     Left Ear: External ear normal.     Nose: Nose normal.     Mouth/Throat:     Pharynx: No oropharyngeal exudate.  Eyes:     General: No scleral icterus.    Conjunctiva/sclera: Conjunctivae normal.  Neck:     Thyroid : No thyromegaly.  Cardiovascular:     Rate and Rhythm: Normal rate and regular rhythm.     Heart sounds: Normal heart sounds. No murmur heard. Pulmonary:     Effort: Pulmonary effort is normal. No respiratory distress.     Breath sounds: Normal breath sounds. No wheezing.  Abdominal:     General: Bowel sounds are normal. There is no distension.     Palpations: Abdomen is soft.     Tenderness: There is no abdominal tenderness. There is no guarding or rebound.  Genitourinary:    Prostate: Normal.     Rectum: Normal.  Musculoskeletal:        General: Normal range of motion.     Cervical back: Neck supple.  Lymphadenopathy:     Cervical: No cervical adenopathy.  Skin:    General: Skin is warm and dry.     Findings: No rash.  Neurological:     Mental Status: He is alert and oriented to person, place, and time.     Coordination: Coordination normal.  Psychiatric:        Behavior: Behavior normal.     Results for orders placed or performed in visit on 09/14/23  Bayer DCA Hb A1c Waived   Collection Time: 09/14/23  9:25 AM  Result Value Ref Range   HB A1C (BAYER DCA - WAIVED) 5.6 4.8 - 5.6 %  CBC with Differential/Platelet   Collection Time: 09/14/23  9:33 AM  Result Value Ref Range   WBC 4.1 3.4 - 10.8 x10E3/uL   RBC 5.46 4.14 - 5.80 x10E6/uL   Hemoglobin 14.7 13.0 - 17.7 g/dL   Hematocrit 40.9 81.1 - 51.0 %   MCV  86 79 - 97 fL   MCH 26.9 26.6 - 33.0 pg   MCHC 31.2 (L) 31.5 - 35.7 g/dL   RDW 91.4 78.2 - 95.6 %   Platelets 245 150 - 450 x10E3/uL   Neutrophils 47 Not Estab. %   Lymphs 42 Not Estab. %   Monocytes 8 Not Estab. %   Eos 2 Not Estab. %   Basos 1 Not  Estab. %   Neutrophils Absolute 2.0 1.4 - 7.0 x10E3/uL   Lymphocytes Absolute 1.7 0.7 - 3.1 x10E3/uL   Monocytes Absolute 0.3 0.1 - 0.9 x10E3/uL   EOS (ABSOLUTE) 0.1 0.0 - 0.4 x10E3/uL   Basophils Absolute 0.0 0.0 - 0.2 x10E3/uL   Immature Granulocytes 0 Not Estab. %   Immature Grans (Abs) 0.0 0.0 - 0.1 x10E3/uL  CMP14+EGFR   Collection Time: 09/14/23  9:33 AM  Result Value Ref Range   Glucose WILL FOLLOW    BUN WILL FOLLOW    Creatinine, Ser WILL FOLLOW    eGFR WILL FOLLOW    BUN/Creatinine Ratio WILL FOLLOW    Sodium WILL FOLLOW    Potassium WILL FOLLOW    Chloride WILL FOLLOW    CO2 WILL FOLLOW    Calcium  WILL FOLLOW    Total Protein WILL FOLLOW    Albumin WILL FOLLOW    Globulin, Total WILL FOLLOW    Bilirubin Total WILL FOLLOW    Alkaline Phosphatase WILL FOLLOW    AST WILL FOLLOW    ALT WILL FOLLOW   Lipid panel   Collection Time: 09/14/23  9:33 AM  Result Value Ref Range   Cholesterol, Total WILL FOLLOW    Triglycerides WILL FOLLOW    HDL WILL FOLLOW    VLDL Cholesterol Cal WILL FOLLOW    LDL Chol Calc (NIH) WILL FOLLOW    LDL CALC COMMENT: WILL FOLLOW    Chol/HDL Ratio WILL FOLLOW   PSA, total and free   Collection Time: 09/14/23  9:33 AM  Result Value Ref Range   Prostate Specific Ag, Serum WILL FOLLOW    PSA, Free WILL FOLLOW    PSA, Free Pct WILL FOLLOW   TSH   Collection Time: 09/14/23  9:33 AM  Result Value Ref Range   TSH WILL FOLLOW     Assessment & Plan:   Problem List Items Addressed This Visit       Cardiovascular and Mediastinum   Hypertension   Relevant Medications   benazepril  (LOTENSIN ) 20 MG tablet   rosuvastatin  (CRESTOR ) 20 MG tablet     Genitourinary   Stage 3b chronic kidney  disease (HCC)     Other   Gout   Hyperlipidemia with target LDL less than 100 - Primary   Relevant Medications   benazepril  (LOTENSIN ) 20 MG tablet   rosuvastatin  (CRESTOR ) 20 MG tablet   Other Visit Diagnoses       Essential hypertension       Relevant Medications   benazepril  (LOTENSIN ) 20 MG tablet   rosuvastatin  (CRESTOR ) 20 MG tablet     Epigastric pain       Relevant Medications   famotidine  (PEPCID ) 20 MG tablet     Incomplete bladder emptying       Relevant Medications   tamsulosin  (FLOMAX ) 0.4 MG CAPS capsule     CBC and A1c looks good, the rest of his labs are still pending  Blood pressure and everything else looks good today.  No changes  Follow up plan: Return in about 6 months (around 03/19/2024), or if symptoms worsen or fail to improve, for Recheck hypertension and cholesterol gout.  Counseling provided for all of the vaccine components No orders of the defined types were placed in this encounter.   Jolyne Needs, MD [facility] 09/18/2023, 2:58 PM

## 2023-09-22 ENCOUNTER — Ambulatory Visit: Payer: Self-pay | Admitting: Family Medicine

## 2023-10-02 DIAGNOSIS — R809 Proteinuria, unspecified: Secondary | ICD-10-CM | POA: Diagnosis not present

## 2023-10-02 DIAGNOSIS — D751 Secondary polycythemia: Secondary | ICD-10-CM | POA: Diagnosis not present

## 2023-10-02 DIAGNOSIS — N1831 Chronic kidney disease, stage 3a: Secondary | ICD-10-CM | POA: Diagnosis not present

## 2023-10-02 DIAGNOSIS — R3914 Feeling of incomplete bladder emptying: Secondary | ICD-10-CM | POA: Diagnosis not present

## 2023-10-09 ENCOUNTER — Ambulatory Visit: Admitting: Urology

## 2023-10-09 ENCOUNTER — Encounter: Payer: Self-pay | Admitting: Urology

## 2023-10-09 VITALS — BP 122/78 | HR 80

## 2023-10-09 DIAGNOSIS — N529 Male erectile dysfunction, unspecified: Secondary | ICD-10-CM | POA: Diagnosis not present

## 2023-10-09 DIAGNOSIS — N401 Enlarged prostate with lower urinary tract symptoms: Secondary | ICD-10-CM

## 2023-10-09 DIAGNOSIS — N138 Other obstructive and reflux uropathy: Secondary | ICD-10-CM | POA: Diagnosis not present

## 2023-10-09 DIAGNOSIS — N139 Obstructive and reflux uropathy, unspecified: Secondary | ICD-10-CM

## 2023-10-09 LAB — BLADDER SCAN AMB NON-IMAGING: Scan Result: 690

## 2023-10-09 LAB — URINALYSIS, ROUTINE W REFLEX MICROSCOPIC
Bilirubin, UA: NEGATIVE
Glucose, UA: NEGATIVE
Ketones, UA: NEGATIVE
Leukocytes,UA: NEGATIVE
Nitrite, UA: NEGATIVE
Protein,UA: NEGATIVE
RBC, UA: NEGATIVE
Specific Gravity, UA: <1.005 — ABNORMAL LOW (ref 1.005–1.030)
Urobilinogen, Ur: 0.2 mg/dL (ref 0.2–1.0)
pH, UA: 6.5 (ref 5.0–7.5)

## 2023-10-09 MED ORDER — TADALAFIL 20 MG PO TABS: 20.0000 mg | ORAL_TABLET | Freq: Every day | ORAL | 3 refills | Status: DC | PRN

## 2023-10-09 NOTE — Progress Notes (Signed)
 10/09/2023 10:13 AM   Milderd FORBES Amas [birth year] [id number]  Referring provider: Dettinger, Fonda LABOR, MD [address] [place],  [place] [address]  No chief complaint on file.   HPI:  F/u -    1) BPH with incomplete bladder emptying-presented with urinary retention and bilateral hydronephrosis in 2019.  His prostate was 42 g.  He underwent thulium laser vaporization of the prostate in 2019.  He had increased urinary symptoms in 2022.  His post void was 700 and then 400.  U/S - no hydronephrosis.  A Apr 2023 CT was benign with no hydro. A May 2023 cystoscopy revealed some regrowth of the prostate and collapse of the channel voiding through a small slitlike aperture.  We considered TURP but he canceled that.  Tamsulosin  increased to twice daily. IPSS was 4. Voiding adequately. His August 2023 PSA was 0.5.  PSA has been as high as 2. Feb 2024 PSA 1.56.   Mar 2024 Cr 1.6. Mar 2024 Renal US  - no hydro, bladder 1094 and then 498 ml p voiding. Yearly labs pending with Dr. Maryanne. Also sees Buthani. IPSS = 11. No bothersome LUTS.   2) low testosterone -10+ year history.  His prolactin and hematocrit were normal.  He tried T-Gel and Testopel  in the past.  His August 2023 testosterone  was 890 on T-gel with Dr. Maryanne.  October 2023 hematocrit 50.3.   3) ED-for several years.  Patient tried tadalafil  20 mg.  Patient underwent low intensity shockwave therapy.   Today, seen for the above. June 2025 - PSA 0.7. Cr 1.6. GFR 47. UA clear. IPSS 6 - flomx working great. No gross hematuria. PVR 690 ml.    He has no gross hematuria.   PMH: Past Medical History:  Diagnosis Date   Allergic rhinitis    BPH (benign prostatic hyperplasia)    Chronic constipation    ED (erectile dysfunction)    GERD (gastroesophageal reflux disease)    Gout    Heme positive stool    history of, normal colonoscopy   History of sleep apnea    corrected after T&A procedure   Hyperlipidemia    Hypertension     Microscopic hematuria    Nocturia    Urinary catheter in place    2018 x one month    Surgical History: Past Surgical History:  Procedure Laterality Date   COLONOSCOPY  06/2017   laser vaporization of prostate     SHOULDER SURGERY Right    THULIUM LASER TURP (TRANSURETHRAL RESECTION OF PROSTATE) N/A 12/26/2017   Procedure: THULIUM LASER TURP (TRANSURETHRAL RESECTION OF PROSTATE);  Surgeon: Nieves Cough, MD;  Location: [facility];  Service: Urology;  Laterality: N/A;   TONSILLECTOMY AND ADENOIDECTOMY  2009   UPPER GI ENDOSCOPY      Home Medications:  Allergies as of 10/09/2023   No Known Allergies      Medication List        Accurate as of October 09, 2023 10:13 AM. If you have any questions, ask your nurse or doctor.          amitriptyline 10 MG tablet Commonly known as: ELAVIL Take 10 mg by mouth at bedtime.   benazepril  20 MG tablet Commonly known as: LOTENSIN  Take 1 tablet (20 mg total) by mouth daily.   colchicine  0.6 MG tablet Take twice daily for gout attack   famotidine  20 MG tablet Commonly known as: PEPCID  Take 1 tablet (20 mg total) by mouth 2 (two) times  daily.   Febuxostat  80 MG Tabs Take 1 tablet (80 mg total) by mouth daily.   fluticasone  50 MCG/ACT nasal spray Commonly known as: FLONASE  Place into both nostrils as needed for allergies or rhinitis.   omeprazole  40 MG capsule Commonly known as: PRILOSEC Take 40 mg by mouth every morning.   rosuvastatin  20 MG tablet Commonly known as: CRESTOR  Take 1 tablet (20 mg total) by mouth daily.   sildenafil  20 MG tablet Commonly known as: REVATIO  Take 1-5 tablets (20-100 mg total) by mouth daily as needed.   tadalafil  20 MG tablet Commonly known as: CIALIS  Take 1 tablet (20 mg total) by mouth daily as needed for erectile dysfunction.   tamsulosin  0.4 MG Caps capsule Commonly known as: FLOMAX  Take 1 capsule (0.4 mg total) by mouth 2 (two) times daily.   Testosterone  20.25  MG/ACT (1.62%) Gel APPLY 2 PUMP TOPICALLY DAILY.        Allergies: No Known Allergies  Family History: Family History  Problem Relation Age of Onset   Cancer Mother    Heart attack Father 60       Died age 54   Prostate cancer Paternal Uncle     Social History:  reports that he has never smoked. He has never used smokeless tobacco. He reports current alcohol use. He reports that he does not use drugs.   Physical Exam: BP 122/78   Pulse 80   Constitutional:  Alert and oriented, No acute distress. HEENT: [place] AT, moist mucus membranes.  Trachea midline, no masses. Cardiovascular: No clubbing, cyanosis, or edema. Respiratory: Normal respiratory effort, no increased work of breathing. GI: Abdomen is soft, nontender, nondistended, no abdominal masses GU: No CVA tenderness Skin: No rashes, bruises or suspicious lesions. Neurologic: Grossly intact, no focal deficits, moving all 4 extremities. Psychiatric: Normal mood and affect.  Laboratory Data: Lab Results  Component Value Date   WBC 4.1 09/14/2023   HGB 14.7 09/14/2023   HCT 47.1 09/14/2023   MCV 86 09/14/2023   PLT 245 09/14/2023    Lab Results  Component Value Date   CREATININE 1.60 (H) 09/14/2023    Lab Results  Component Value Date   PSA 0.6 07/08/2014   PSA 0.51 02/19/2013   PSA 0.7 11/14/2012    Lab Results  Component Value Date   TESTOSTERONE  496 01/08/2021    Lab Results  Component Value Date   HGBA1C 5.6 09/14/2023    Urinalysis    Component Value Date/Time   COLORURINE COLORLESS (A) 05/21/2022 2104   APPEARANCEUR Clear 12/26/2022 0956   LABSPEC 1.005 05/21/2022 2104   PHURINE 6.5 05/21/2022 2104   GLUCOSEU Negative 12/26/2022 0956   HGBUR NEGATIVE 05/21/2022 2104   BILIRUBINUR Negative 12/26/2022 0956   KETONESUR NEGATIVE 05/21/2022 2104   PROTEINUR Negative 12/26/2022 0956   PROTEINUR NEGATIVE 05/21/2022 2104   NITRITE Negative 12/26/2022 0956   NITRITE NEGATIVE 05/21/2022 2104    LEUKOCYTESUR Negative 12/26/2022 0956   LEUKOCYTESUR NEGATIVE 05/21/2022 2104    Lab Results  Component Value Date   LABMICR Comment 12/26/2022   WBCUA 0-5 06/27/2022   RBCUA 3-10 (A) 09/22/2017   LABEPIT 0-10 06/27/2022   BACTERIA None seen 06/27/2022    Pertinent Imaging:   Results for orders placed during the hospital encounter of 07/05/22  US  RENAL  Narrative CLINICAL DATA:  Microhematuria  EXAM: RENAL / URINARY TRACT ULTRASOUND COMPLETE  COMPARISON:  None Available.  FINDINGS: Right Kidney:  Renal measurements: 11.9 x 6.7 x 6.0 cm =  volume: 257 mL. Mild pelvicaliectasis, not identified previously.  Left Kidney:  Renal measurements: 9.4 x 4.7 x 4.9 cm = volume: 113 mL. Cortical thinning. Increased cortical echogenicity.  Bladder:  Distended with a prevoid volume of 1094 cc and postvoid volume of 498 cc.  Other:  None.  IMPRESSION: 1. Mild pelvicaliectasis on the right, not identified previously. CT imaging could better evaluate if clinically warranted. 2. The left kidney is smaller than the right with cortical thinning and increased cortical echogenicity consistent with chronic medical renal disease. 3. The bladder is distended with a prevoid volume of 1094 cc and a postvoid volume of 498 cc.   Electronically Signed By: Alm Pouch III M.D. On: 07/05/2022 17:39    Assessment & Plan:    1. BPH with obstruction/lower urinary tract symptoms (Primary) Doing well. IPSS low. Cr stable. Check renal US  given PVR elevated (but stable).   - Urinalysis, Routine w reflex microscopic  2. ED - tadalafil  refilled.   No follow-ups on file.  Donnice Brooks, MD  [facility]  [address] [place], [place] [address] [phone]

## 2023-10-09 NOTE — Progress Notes (Signed)
 Bladder Scan completed today.  Patient can void prior to the bladder scan. Bladder scan result: 690  Performed By: Exie DASEN. CMA

## 2023-10-17 ENCOUNTER — Other Ambulatory Visit (HOSPITAL_COMMUNITY)

## 2023-10-17 ENCOUNTER — Encounter: Payer: Self-pay | Admitting: Family Medicine

## 2023-10-17 DIAGNOSIS — E349 Endocrine disorder, unspecified: Secondary | ICD-10-CM

## 2023-10-18 MED ORDER — TESTOSTERONE 20.25 MG/ACT (1.62%) TD GEL: 2.0000 | Freq: Every day | TRANSDERMAL | 5 refills | Status: AC

## 2023-10-19 ENCOUNTER — Ambulatory Visit (HOSPITAL_COMMUNITY)
Admission: RE | Admit: 2023-10-19 | Discharge: 2023-10-19 | Disposition: A | Discharge status: Home / Self Care | Source: Ambulatory Visit | Attending: Urology | Admitting: Urology

## 2023-10-19 DIAGNOSIS — N138 Other obstructive and reflux uropathy: Secondary | ICD-10-CM | POA: Insufficient documentation

## 2023-10-19 DIAGNOSIS — N401 Enlarged prostate with lower urinary tract symptoms: Secondary | ICD-10-CM | POA: Diagnosis not present

## 2023-10-19 DIAGNOSIS — N139 Obstructive and reflux uropathy, unspecified: Secondary | ICD-10-CM | POA: Diagnosis not present

## 2023-10-19 DIAGNOSIS — N3289 Other specified disorders of bladder: Secondary | ICD-10-CM | POA: Diagnosis not present

## 2023-10-23 ENCOUNTER — Ambulatory Visit: Payer: Self-pay

## 2023-12-07 ENCOUNTER — Other Ambulatory Visit: Payer: Self-pay | Admitting: Family Medicine

## 2024-02-05 ENCOUNTER — Encounter: Payer: Self-pay | Admitting: Family Medicine

## 2024-02-13 ENCOUNTER — Encounter: Payer: Self-pay | Admitting: *Deleted

## 2024-02-16 ENCOUNTER — Other Ambulatory Visit

## 2024-02-16 DIAGNOSIS — D631 Anemia in chronic kidney disease: Secondary | ICD-10-CM | POA: Diagnosis not present

## 2024-02-16 DIAGNOSIS — R809 Proteinuria, unspecified: Secondary | ICD-10-CM | POA: Diagnosis not present

## 2024-02-19 ENCOUNTER — Other Ambulatory Visit: Payer: Self-pay | Admitting: Family Medicine

## 2024-02-19 DIAGNOSIS — I10 Essential (primary) hypertension: Secondary | ICD-10-CM

## 2024-02-19 MED ORDER — BENAZEPRIL HCL 20 MG PO TABS: 30.0000 mg | ORAL_TABLET | Freq: Every day | ORAL | 3 refills | Status: AC

## 2024-02-19 NOTE — Progress Notes (Unsigned)
 BP looks more elevated over the past 2 weeks, there is a lot of 150s and 160s, there are some normals but it looks more often over 140 and 150 then below so we will increase his benazepril , I want him to take 1-1/2 tablets daily, this should make it a total of 30 mg, I am sending a prescription for him. Fonda Levins, MD [facility] 02/19/2024, 3:30 PM

## 2024-03-05 ENCOUNTER — Other Ambulatory Visit: Payer: Self-pay

## 2024-03-05 DIAGNOSIS — N5201 Erectile dysfunction due to arterial insufficiency: Secondary | ICD-10-CM

## 2024-03-06 MED ORDER — SILDENAFIL CITRATE 20 MG PO TABS: 20.0000 mg | ORAL_TABLET | Freq: Every day | ORAL | 5 refills | Status: DC | PRN

## 2024-03-15 ENCOUNTER — Telehealth: Payer: Self-pay

## 2024-03-21 ENCOUNTER — Ambulatory Visit

## 2024-03-22 ENCOUNTER — Ambulatory Visit: Payer: Self-pay | Admitting: Family Medicine

## 2024-03-26 MED ORDER — SILDENAFIL CITRATE 20 MG PO TABS: 20.0000 mg | ORAL_TABLET | Freq: Every day | ORAL | 5 refills | Status: AC | PRN

## 2024-03-26 NOTE — Telephone Encounter (Signed)
 Okay to refill sildenafil  20 mg, 1 to 5 tablets as needed, 30 LVM making pt aware

## 2024-04-07 ENCOUNTER — Telehealth: Admitting: Family

## 2024-04-07 ENCOUNTER — Encounter: Payer: Self-pay | Admitting: Family Medicine

## 2024-04-07 DIAGNOSIS — U071 COVID-19: Secondary | ICD-10-CM

## 2024-04-07 MED ORDER — NIRMATRELVIR/RITONAVIR (PAXLOVID)TABLET: 3.0000 | ORAL_TABLET | Freq: Two times a day (BID) | ORAL | 0 refills | Status: AC

## 2024-04-07 MED ORDER — BENZONATATE 200 MG PO CAPS: 200.0000 mg | ORAL_CAPSULE | Freq: Two times a day (BID) | ORAL | 0 refills | Status: AC | PRN

## 2024-04-07 NOTE — Progress Notes (Signed)
 " Virtual Visit Consent   OAKLAN PERSONS, you are scheduled for a virtual visit with a [facility] provider today. Just as with appointments in the office, your consent must be obtained to participate. Your consent will be active for this visit and any virtual visit you may have with one of our providers in the next 365 days. If you have a MyChart account, a copy of this consent can be sent to you electronically.  As this is a virtual visit, video technology does not allow for your provider to perform a traditional examination. This may limit your provider's ability to fully assess your condition. If your provider identifies any concerns that need to be evaluated in person or the need to arrange testing (such as labs, EKG, etc.), we will make arrangements to do so. Although advances in technology are sophisticated, we cannot ensure that it will always work on either your end or our end. If the connection with a video visit is poor, the visit may have to be switched to a telephone visit. With either a video or telephone visit, we are not always able to ensure that we have a secure connection.  By engaging in this virtual visit, you consent to the provision of healthcare and authorize for your insurance to be billed (if applicable) for the services provided during this visit. Depending on your insurance coverage, you may receive a charge related to this service.  I need to obtain your verbal consent now. Are you willing to proceed with your visit today? FADI MENTER has provided verbal consent on 04/07/2024 for a virtual visit (video or telephone). Bari Learn, FNP  Date: 04/07/2024 5:59 PM   Virtual Visit via Video Note   I, Bari Learn, connected with  Matthew [name]  ([id number], 1955/04/25) on 04/07/2024 at  6:00 PM EST by a video-enabled telemedicine application and verified that I am speaking with the correct person using two identifiers.  Location: Patient: Virtual Visit Location  Patient: Home Provider: Virtual Visit Location Provider: Home Office   I discussed the limitations of evaluation and management by telemedicine and the availability of in person appointments. The patient expressed understanding and agreed to proceed.    History of Present Illness: LARRELL [name] is a 68 y.o. who identifies as a male who was assigned male at birth, and is being seen today for COVID. Reports his symptoms started two days ago and has worsen. Tested positive this AM.   HPI: URI  This is a new problem. The current episode started yesterday. The problem has been gradually worsening. The maximum temperature recorded prior to his arrival was 100.4 - 100.9 F. Associated symptoms include congestion, coughing, ear pain, headaches, joint pain, rhinorrhea, sinus pain, sneezing and a sore throat. He has tried decongestant and increased fluids for the symptoms. The treatment provided mild relief.    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  Resting comfortably  at home.  Head is normocephalic, atraumatic.  No labored breathing.  Speech is clear and coherent with logical content.  Patient is alert and oriented at baseline.  Nasal congestion  Dry nonproductive cough  Assessment and Plan: 1. COVID-19 (Primary) - nirmatrelvir /ritonavir  (PAXLOVID ) 20 x 150 MG & 10 x 100MG  TABS; Take 3 tablets by mouth 2  (two) times daily for 5 days. (Take nirmatrelvir  150 mg two tablets twice daily for 5 days and ritonavir  100 mg one tablet twice daily for 5 days) Patient GFR is 47  Dispense: 30 tablet; Refill: 0 - benzonatate  (TESSALON ) 200 MG capsule; Take 1 capsule (200 mg total) by mouth 2 (two) times daily as needed for cough.  Dispense: 20 capsule; Refill: 0  COVID positive, rest, force fluids, tylenol as needed, report any worsening symptoms such as increased shortness of breath, swelling, or continued high fevers. Possible adverse effects discussed with antivirals.    Follow Up Instructions: I discussed the assessment and treatment plan with the patient. The patient was provided an opportunity to ask questions and all were answered. The patient agreed with the plan and demonstrated an understanding of the instructions.  A copy of instructions were sent to the patient via MyChart unless otherwise noted below.     The patient was advised to call back or seek an in-person evaluation if the symptoms worsen or if the condition fails to improve as anticipated.    Bari Learn, FNP    [1] No Known Allergies [2]  Current Outpatient Medications:    benzonatate  (TESSALON ) 200 MG capsule, Take 1 capsule (200 mg total) by mouth 2 (two) times daily as needed for cough., Disp: 20 capsule, Rfl: 0   nirmatrelvir /ritonavir  (PAXLOVID ) 20 x 150 MG & 10 x 100MG  TABS, Take 3 tablets by mouth 2 (two) times daily for 5 days. (Take nirmatrelvir  150 mg two tablets twice daily for 5 days and ritonavir  100 mg one tablet twice daily for 5 days) Patient GFR is 47, Disp: 30 tablet, Rfl: 0   amitriptyline (ELAVIL) 10 MG tablet, Take 10 mg by mouth at bedtime., Disp: , Rfl:    benazepril  (LOTENSIN ) 20 MG tablet, Take 1.5 tablets (30 mg total) by mouth daily., Disp: 135 tablet, Rfl: 3   colchicine  0.6 MG tablet, Take twice daily for gout attack, Disp: 180 tablet, Rfl: 1   famotidine  (PEPCID ) 20 MG tablet, Take 1 tablet (20 mg  total) by mouth 2 (two) times daily., Disp: 180 tablet, Rfl: 3   Febuxostat  80 MG TABS, Take 1 tablet (80 mg total) by mouth daily., Disp: 90 tablet, Rfl: 3   fluticasone  (FLONASE ) 50 MCG/ACT nasal spray, Place into both nostrils as needed for allergies or rhinitis., Disp: , Rfl:    omeprazole  (PRILOSEC) 40 MG capsule, Take 40 mg by mouth every morning., Disp: , Rfl:    rosuvastatin  (CRESTOR ) 20 MG tablet, Take 1 tablet (20 mg total) by mouth daily., Disp: 90 tablet, Rfl: 3   sildenafil  (REVATIO ) 20 MG tablet, Take 1-5 tablets (20-100 mg total) by mouth daily as needed., Disp: 30 tablet, Rfl: 5   tadalafil  (CIALIS ) 20 MG tablet, Take 1 tablet (20 mg total) by mouth daily as needed for erectile dysfunction., Disp: 45 tablet, Rfl: 3   tamsulosin  (FLOMAX ) 0.4 MG CAPS capsule, Take 1 capsule (0.4 mg total) by mouth 2 (two) times daily., Disp: 180 capsule, Rfl: 3   Testosterone  20.25 MG/ACT (1.62%) GEL, Apply 2 Pump topically daily., Disp: 75 g, Rfl: 5  "

## 2024-04-08 ENCOUNTER — Ambulatory Visit: Admitting: Urology

## 2024-04-19 ENCOUNTER — Other Ambulatory Visit: Payer: Self-pay | Admitting: Family Medicine

## 2024-04-19 DIAGNOSIS — E349 Endocrine disorder, unspecified: Secondary | ICD-10-CM

## 2024-05-03 ENCOUNTER — Ambulatory Visit

## 2024-05-03 VITALS — BP 119/71 | HR 69 | Temp 97.9°F | Ht 70.0 in | Wt 218.0 lb

## 2024-05-03 DIAGNOSIS — Z0000 Encounter for general adult medical examination without abnormal findings: Secondary | ICD-10-CM

## 2024-05-03 NOTE — Progress Notes (Signed)
 Spoke with patient and made him aware that he would need to call the providers office who last prescribed this for him if he has questions/issues with the medication. Patient got very short with me and was even kind of rude, but said he understood.

## 2024-05-03 NOTE — Patient Instructions (Signed)
 Mr. Voiles,  Thank you for taking the time for your Medicare Wellness Visit. I appreciate your continued commitment to your health goals. Please review the care plan we discussed, and feel free to reach out if I can assist you further.  Please note that Annual Wellness Visits do not include a physical exam. Some assessments may be limited, especially if the visit was conducted virtually. If needed, we may recommend an in-person follow-up with your provider.  Ongoing Care Seeing your primary care provider every 3 to 6 months helps us  monitor your health and provide consistent, personalized care.   Referrals If a referral was made during today's visit and you haven't received any updates within two weeks, please contact the referred provider directly to check on the status.  Recommended Screenings:  Health Maintenance  Topic Date Due   Medicare Annual Wellness Visit  12/15/2023   DTaP/Tdap/Td vaccine (2 - Td or Tdap) 01/03/2024   COVID-19 Vaccine (6 - Moderna risk 2025-26 season) 07/11/2024   Colon Cancer Screening  06/17/2027   Pneumococcal Vaccine for age over 64  Completed   Flu Shot  Completed   Hepatitis C Screening  Completed   Zoster (Shingles) Vaccine  Completed   Meningitis B Vaccine  Aged Out   Stool Blood Test  Discontinued       05/03/2024   12:52 PM  Advanced Directives  Does Patient Have a Medical Advance Directive? No  Would patient like information on creating a medical advance directive? No - Patient declined    Vision: Annual vision screenings are recommended for early detection of glaucoma, cataracts, and diabetic retinopathy. These exams can also reveal signs of chronic conditions such as diabetes and high blood pressure.  Dental: Annual dental screenings help detect early signs of oral cancer, gum disease, and other conditions linked to overall health, including heart disease and diabetes.  Please see the attached documents for additional preventive care  recommendations.

## 2024-05-03 NOTE — Progress Notes (Signed)
 Im not showing that Dr Maryanne is the prescriber for this medication. Looks like it was last prescribed by [facility] in March 2025. Can PCP confirm this?

## 2024-05-03 NOTE — Progress Notes (Signed)
 "  Chief Complaint  Patient presents with   Medicare Wellness     Subjective:   Matthew [name] is a 69 y.o. male who presents for a Medicare Annual Wellness Visit.  Visit info / Clinical Intake: Medicare Wellness Visit Type:: Initial Annual Wellness Visit Persons participating in visit and providing information:: patient Medicare Wellness Visit Mode:: In-person (required for WTM) Interpreter Needed?: No Pre-visit prep was completed: no AWV questionnaire completed by patient prior to visit?: yes Date:: 05/03/24 Living arrangements:: (Patient-Rptd) lives with spouse/significant other Patient's Overall Health Status Rating: (Patient-Rptd) very good Typical amount of pain: (Patient-Rptd) some Does pain affect daily life?: (Patient-Rptd) no Are you currently prescribed opioids?: no  Dietary Habits and Nutritional Risks How many meals a day?: (Patient-Rptd) 3 Eats fruit and vegetables daily?: (Patient-Rptd) yes Most meals are obtained by: (Patient-Rptd) preparing own meals In the last 2 weeks, have you had any of the following?: none Diabetic:: no  Functional Status Activities of Daily Living (to include ambulation/medication): (Patient-Rptd) Independent Ambulation: (Patient-Rptd) Independent Medication Administration: (Patient-Rptd) Independent Home Management (perform basic housework or laundry): (Patient-Rptd) Independent Manage your own finances?: (Patient-Rptd) yes Primary transportation is: (Patient-Rptd) driving Concerns about vision?: no *vision screening is required for WTM* (last about 1 year) Concerns about hearing?: no  Fall Screening Falls in the past year?: (Patient-Rptd) 0 Number of falls in past year: 0 Was there an injury with Fall?: 0 Fall Risk Category Calculator: 0 Patient Fall Risk Level: Low Fall Risk  Fall Risk Patient at Risk for Falls Due to: No Fall Risks Fall risk Follow up: Falls evaluation completed; Education provided  Home and  Transportation Safety: All rugs have non-skid backing?: (Patient-Rptd) yes All stairs or steps have railings?: (Patient-Rptd) yes Grab bars in the bathtub or shower?: (!) (Patient-Rptd) no Have non-skid surface in bathtub or shower?: (!) (Patient-Rptd) no Good home lighting?: (Patient-Rptd) yes Regular seat belt use?: (Patient-Rptd) yes [name] stays in the last year:: (Patient-Rptd) no  Cognitive Assessment Difficulty concentrating, remembering, or making decisions? : (Patient-Rptd) no Will 6CIT or Mini Cog be Completed: yes What year is it?: 0 points What month is it?: 0 points Give patient an address phrase to remember (5 components): [address] About what time is it?: 0 points Count backwards from 20 to 1: 0 points Say the months of the year in reverse: 0 points Repeat the address phrase from earlier: 0 points 6 CIT Score: 0 points  Advance Directives (For Healthcare) Does Patient Have a Medical Advance Directive?: No Would patient like information on creating a medical advance directive?: No - Patient declined  Reviewed/Updated  Reviewed/Updated: Reviewed All (Medical, Surgical, Family, Medications, Allergies, Care Teams, Patient Goals); Medical History; Surgical History; Family History; Medications; Allergies; Care Teams; Patient Goals    Allergies (verified) Patient has no known allergies.   Current Medications (verified) Outpatient Encounter Medications as of 05/03/2024  Medication Sig   amitriptyline (ELAVIL) 10 MG tablet Take 10 mg by mouth at bedtime.   benazepril  (LOTENSIN ) 20 MG tablet Take 1.5 tablets (30 mg total) by mouth daily.   benzonatate  (TESSALON ) 200 MG capsule Take 1 capsule (200 mg total) by mouth 2 (two) times daily as needed for cough.   colchicine  0.6 MG tablet Take twice daily for gout attack   famotidine  (PEPCID ) 20 MG tablet Take 1 tablet (20 mg total) by mouth 2 (two) times daily.   Febuxostat  80 MG TABS Take 1 tablet (80 mg  total) by mouth daily.  fluticasone  (FLONASE ) 50 MCG/ACT nasal spray Place into both nostrils as needed for allergies or rhinitis.   omeprazole  (PRILOSEC) 40 MG capsule Take 40 mg by mouth every morning.   rosuvastatin  (CRESTOR ) 20 MG tablet Take 1 tablet (20 mg total) by mouth daily.   sildenafil  (REVATIO ) 20 MG tablet Take 1-5 tablets (20-100 mg total) by mouth daily as needed.   tadalafil  (CIALIS ) 20 MG tablet Take 1 tablet (20 mg total) by mouth daily as needed for erectile dysfunction.   tamsulosin  (FLOMAX ) 0.4 MG CAPS capsule Take 1 capsule (0.4 mg total) by mouth 2 (two) times daily.   Testosterone  20.25 MG/ACT (1.62%) GEL Apply 2 Pump topically daily.   No facility-administered encounter medications on file as of 05/03/2024.    History: Past Medical History:  Diagnosis Date   Allergic rhinitis    BPH (benign prostatic hyperplasia)    Chronic constipation    Chronic kidney disease 2018   stage 3   ED (erectile dysfunction)    GERD (gastroesophageal reflux disease)    Gout    Heme positive stool    history of, normal colonoscopy   History of sleep apnea    corrected after T&A procedure   Hyperlipidemia    Hypertension    Microscopic hematuria    Nocturia    Sleep apnea 2004   2006 surgery to correct   Urinary catheter in place    2018 x one month   Past Surgical History:  Procedure Laterality Date   COLONOSCOPY  06/2017   laser vaporization of prostate     SHOULDER SURGERY Right    THULIUM LASER TURP (TRANSURETHRAL RESECTION OF PROSTATE) N/A 12/26/2017   Procedure: THULIUM LASER TURP (TRANSURETHRAL RESECTION OF PROSTATE);  Surgeon: Nieves Cough, MD;  Location: [name];  Service: Urology;  Laterality: N/A;   TONSILLECTOMY AND ADENOIDECTOMY  2009   UPPER GI ENDOSCOPY     Family History  Problem Relation Age of Onset   Cancer Mother    Depression Mother    Miscarriages / Stillbirths Mother    Varicose Veins Mother    Heart attack Father 28        Died age 16   Heart disease Father    Prostate cancer Paternal Uncle    Social History   Occupational History   Not on file  Tobacco Use   Smoking status: Never   Smokeless tobacco: Never  Vaping Use   Vaping status: Never Used  Substance and Sexual Activity   Alcohol use: Yes    Comment: 2 per month   Drug use: No   Sexual activity: Not on file   Tobacco Counseling Counseling given: Yes  SDOH Screenings   Food Insecurity: No Food Insecurity (05/03/2024)  Housing: Low Risk (05/03/2024)  Transportation Needs: No Transportation Needs (05/03/2024)  Utilities: Not At Risk (05/03/2024)  Alcohol Screen: Low Risk (05/03/2024)  Depression (PHQ2-9): Low Risk (05/03/2024)  Financial Resource Strain: Low Risk (05/03/2024)  Physical Activity: Insufficiently Active (05/03/2024)  Social Connections: Socially Integrated (05/03/2024)  Stress: Stress Concern Present (05/03/2024)  Tobacco Use: Low Risk (05/03/2024)  Health Literacy: Adequate Health Literacy (05/03/2024)   See flowsheets for full screening details  Depression Screen PHQ 2 & 9 Depression Scale- Over the past 2 weeks, how often have you been bothered by any of the following problems? Little interest or pleasure in doing things: 0 Feeling down, depressed, or hopeless (PHQ Adolescent also includes...irritable): 0 PHQ-2 Total Score: 0 Trouble falling or staying  asleep, or sleeping too much: 1 Feeling tired or having little energy: 0 Poor appetite or overeating (PHQ Adolescent also includes...weight loss): 0 Feeling bad about yourself - or that you are a failure or have let yourself or your family down: 0 Trouble concentrating on things, such as reading the newspaper or watching television (PHQ Adolescent also includes...like school work): 0 Moving or speaking so slowly that other people could have noticed. Or the opposite - being so fidgety or restless that you have been moving around a lot more than usual: 0 Thoughts that you  would be better off dead, or of hurting yourself in some way: 0 PHQ-9 Total Score: 1 If you checked off any problems, how difficult have these problems made it for you to do your work, take care of things at home, or get along with other people?: Not difficult at all  Depression Treatment Depression Interventions/Treatment : EYV7-0 Score <4 Follow-up Not Indicated     Goals Addressed             This Visit's Progress    Stay Active and Independent               Objective:    Today's Vitals   05/03/24 1356  BP: 119/71  Pulse: 69  Temp: 97.9 F (36.6 C)  TempSrc: Oral  Weight: 218 lb (98.9 kg)  Height: 5' 10 (1.778 m)   Body mass index is 31.28 kg/m.  Hearing/Vision screen No results found. Immunizations and Health Maintenance Health Maintenance  Topic Date Due   DTaP/Tdap/Td (2 - Td or Tdap) 01/03/2024   COVID-19 Vaccine (6 - 2025-26 season) 07/11/2024   Medicare Annual Wellness (AWV)  05/03/2025   Colonoscopy  06/17/2027   Pneumococcal Vaccine: 50+ Years  Completed   Influenza Vaccine  Completed   Hepatitis C Screening  Completed   Zoster Vaccines- Shingrix  Completed   Meningococcal B Vaccine  Aged Out   COLON CANCER SCREENING ANNUAL FOBT  Discontinued        Assessment/Plan:  This is a routine wellness examination for Matthew [name].  Patient Care Team: Dettinger, Fonda LABOR, MD as PCP - General (Family Medicine)  I have personally reviewed and noted the following in the patients chart:   Medical and social history Use of alcohol, tobacco or illicit drugs  Current medications and supplements including opioid prescriptions. Functional ability and status Nutritional status Physical activity Advanced directives List of other physicians Hospitalizations, surgeries, and ER visits in previous 12 months Vitals Screenings to include cognitive, depression, and falls Referrals and appointments  No orders of the defined types were placed in this  encounter.  In addition, I have reviewed and discussed with patient certain preventive protocols, quality metrics, and best practice recommendations. A written personalized care plan for preventive services as well as general preventive health recommendations were provided to patient.   Matthew [name], CMA   05/03/2024   Return in 1 year (on 05/03/2025).  After Visit Summary: (MyChart) Due to this being a telephonic visit, the after visit summary with patients personalized plan was offered to patient via MyChart   Nurse Notes: Dtap given at next pcp visit  "

## 2024-05-09 ENCOUNTER — Encounter: Payer: Self-pay | Admitting: Family Medicine

## 2024-05-09 ENCOUNTER — Encounter: Payer: Self-pay | Admitting: Urology

## 2024-05-10 NOTE — Telephone Encounter (Signed)
 Dr. Maryanne, are you alright managing his testosterone  still? He may need labs but he does not have an upcoming appt scheduled. Last OV was 09/18/23 and he had lab work at that time but a testosterone  was not checked.

## 2024-05-14 ENCOUNTER — Other Ambulatory Visit: Payer: Self-pay

## 2024-05-14 DIAGNOSIS — N5201 Erectile dysfunction due to arterial insufficiency: Secondary | ICD-10-CM

## 2024-05-14 MED ORDER — TADALAFIL 20 MG PO TABS: 20.0000 mg | ORAL_TABLET | Freq: Every day | ORAL | 3 refills | Status: AC | PRN

## 2024-05-15 NOTE — Progress Notes (Addendum)
 "  Chief Complaint  Patient presents with   Medicare Wellness     Subjective:   Matthew [name] is a 69 y.o. male who presents for a Medicare Annual Wellness Visit.  Visit info / Clinical Intake: Medicare Wellness Visit Type:: Initial Annual Wellness Visit Persons participating in visit and providing information:: patient Medicare Wellness Visit Mode:: In-person (required for WTM) Interpreter Needed?: No Pre-visit prep was completed: no AWV questionnaire completed by patient prior to visit?: yes Date:: 05/03/24 Living arrangements:: (Patient-Rptd) lives with spouse/significant other Patient's Overall Health Status Rating: (Patient-Rptd) very good Typical amount of pain: (Patient-Rptd) some Does pain affect daily life?: (Patient-Rptd) no Are you currently prescribed opioids?: no  Dietary Habits and Nutritional Risks How many meals a day?: (Patient-Rptd) 3 Eats fruit and vegetables daily?: (Patient-Rptd) yes Most meals are obtained by: (Patient-Rptd) preparing own meals In the last 2 weeks, have you had any of the following?: none Diabetic:: no  Functional Status Activities of Daily Living (to include ambulation/medication): (Patient-Rptd) Independent Ambulation: (Patient-Rptd) Independent Medication Administration: (Patient-Rptd) Independent Home Management (perform basic housework or laundry): (Patient-Rptd) Independent Manage your own finances?: (Patient-Rptd) yes Primary transportation is: (Patient-Rptd) driving Concerns about vision?: no *vision screening is required for WTM* (last about 1 year) Concerns about hearing?: no  Fall Screening Falls in the past year?: (Patient-Rptd) 0 Number of falls in past year: 0 Was there an injury with Fall?: 0 Fall Risk Category Calculator: 0 Patient Fall Risk Level: Low Fall Risk  Fall Risk Patient at Risk for Falls Due to: No Fall Risks Fall risk Follow up: Falls evaluation completed; Education provided  Home and  Transportation Safety: All rugs have non-skid backing?: (Patient-Rptd) yes All stairs or steps have railings?: (Patient-Rptd) yes Grab bars in the bathtub or shower?: (!) (Patient-Rptd) no Have non-skid surface in bathtub or shower?: (!) (Patient-Rptd) no Good home lighting?: (Patient-Rptd) yes Regular seat belt use?: (Patient-Rptd) yes Hospital stays in the last year:: (Patient-Rptd) no  Cognitive Assessment Difficulty concentrating, remembering, or making decisions? : (Patient-Rptd) no Will 6CIT or Mini Cog be Completed: yes What year is it?: 0 points What month is it?: 0 points Give patient an address phrase to remember (5 components): [address] About what time is it?: 0 points Count backwards from 20 to 1: 0 points Say the months of the year in reverse: 0 points Repeat the address phrase from earlier: 0 points 6 CIT Score: 0 points  Advance Directives (For Healthcare) Does Patient Have a Medical Advance Directive?: No Would patient like information on creating a medical advance directive?: No - Patient declined  Reviewed/Updated  Reviewed/Updated: Reviewed All (Medical, Surgical, Family, Medications, Allergies, Care Teams, Patient Goals); Medical History; Surgical History; Family History; Medications; Allergies; Care Teams; Patient Goals    Allergies (verified) Patient has no known allergies.   Current Medications (verified) Outpatient Encounter Medications as of 05/03/2024  Medication Sig   amitriptyline (ELAVIL) 10 MG tablet Take 10 mg by mouth at bedtime.   benazepril  (LOTENSIN ) 20 MG tablet Take 1.5 tablets (30 mg total) by mouth daily.   benzonatate  (TESSALON ) 200 MG capsule Take 1 capsule (200 mg total) by mouth 2 (two) times daily as needed for cough.   colchicine  0.6 MG tablet Take twice daily for gout attack   famotidine  (PEPCID ) 20 MG tablet Take 1 tablet (20 mg total) by mouth 2 (two) times daily.   Febuxostat  80 MG TABS Take 1 tablet (80 mg  total) by mouth daily.  fluticasone  (FLONASE ) 50 MCG/ACT nasal spray Place into both nostrils as needed for allergies or rhinitis.   omeprazole  (PRILOSEC) 40 MG capsule Take 40 mg by mouth every morning.   rosuvastatin  (CRESTOR ) 20 MG tablet Take 1 tablet (20 mg total) by mouth daily.   sildenafil  (REVATIO ) 20 MG tablet Take 1-5 tablets (20-100 mg total) by mouth daily as needed.   tamsulosin  (FLOMAX ) 0.4 MG CAPS capsule Take 1 capsule (0.4 mg total) by mouth 2 (two) times daily.   Testosterone  20.25 MG/ACT (1.62%) GEL Apply 2 Pump topically daily.   [DISCONTINUED] tadalafil  (CIALIS ) 20 MG tablet Take 1 tablet (20 mg total) by mouth daily as needed for erectile dysfunction.   No facility-administered encounter medications on file as of 05/03/2024.    History: Past Medical History:  Diagnosis Date   Allergic rhinitis    BPH (benign prostatic hyperplasia)    Chronic constipation    Chronic kidney disease 2018   stage 3   ED (erectile dysfunction)    GERD (gastroesophageal reflux disease)    Gout    Heme positive stool    history of, normal colonoscopy   History of sleep apnea    corrected after T&A procedure   Hyperlipidemia    Hypertension    Microscopic hematuria    Nocturia    Sleep apnea 2004   2006 surgery to correct   Urinary catheter in place    2018 x one month   Past Surgical History:  Procedure Laterality Date   COLONOSCOPY  06/2017   laser vaporization of prostate     SHOULDER SURGERY Right    THULIUM LASER TURP (TRANSURETHRAL RESECTION OF PROSTATE) N/A 12/26/2017   Procedure: THULIUM LASER TURP (TRANSURETHRAL RESECTION OF PROSTATE);  Surgeon: Nieves Cough, MD;  Location: [facility];  Service: Urology;  Laterality: N/A;   TONSILLECTOMY AND ADENOIDECTOMY  2009   UPPER GI ENDOSCOPY     Family History  Problem Relation Age of Onset   Cancer Mother    Depression Mother    Miscarriages / Stillbirths Mother    Varicose Veins Mother    Heart  attack Father 52       Died age 16   Heart disease Father    Prostate cancer Paternal Uncle    Social History   Occupational History   Not on file  Tobacco Use   Smoking status: Never   Smokeless tobacco: Never  Vaping Use   Vaping status: Never Used  Substance and Sexual Activity   Alcohol use: Yes    Comment: 2 per month   Drug use: No   Sexual activity: Not on file   Tobacco Counseling Counseling given: Yes  SDOH Screenings   Food Insecurity: No Food Insecurity (05/03/2024)  Housing: Low Risk (05/03/2024)  Transportation Needs: No Transportation Needs (05/03/2024)  Utilities: Not At Risk (05/03/2024)  Alcohol Screen: Low Risk (05/03/2024)  Depression (PHQ2-9): Low Risk (05/03/2024)  Financial Resource Strain: Low Risk (05/03/2024)  Physical Activity: Insufficiently Active (05/03/2024)  Social Connections: Socially Integrated (05/03/2024)  Stress: Stress Concern Present (05/03/2024)  Tobacco Use: Low Risk (05/03/2024)  Health Literacy: Adequate Health Literacy (05/03/2024)   See flowsheets for full screening details  Depression Screen PHQ 2 & 9 Depression Scale- Over the past 2 weeks, how often have you been bothered by any of the following problems? Little interest or pleasure in doing things: 0 Feeling down, depressed, or hopeless (PHQ Adolescent also includes...irritable): 0 PHQ-2 Total Score: 0 Trouble falling or  staying asleep, or sleeping too much: 1 Feeling tired or having little energy: 0 Poor appetite or overeating (PHQ Adolescent also includes...weight loss): 0 Feeling bad about yourself - or that you are a failure or have let yourself or your family down: 0 Trouble concentrating on things, such as reading the newspaper or watching television (PHQ Adolescent also includes...like school work): 0 Moving or speaking so slowly that other people could have noticed. Or the opposite - being so fidgety or restless that you have been moving around a lot more than usual:  0 Thoughts that you would be better off dead, or of hurting yourself in some way: 0 PHQ-9 Total Score: 1 If you checked off any problems, how difficult have these problems made it for you to do your work, take care of things at home, or get along with other people?: Not difficult at all  Depression Treatment Depression Interventions/Treatment : EYV7-0 Score <4 Follow-up Not Indicated     Goals Addressed             This Visit's Progress    Stay Active and Independent               Objective:    Today's Vitals   05/03/24 1356  BP: 119/71  Pulse: 69  Temp: 97.9 F (36.6 C)  TempSrc: Oral  Weight: 218 lb (98.9 kg)  Height: 5' 10 (1.778 m)   Body mass index is 31.28 kg/m.  Hearing/Vision screen No results found. Immunizations and Health Maintenance Health Maintenance  Topic Date Due   DTaP/Tdap/Td (2 - Td or Tdap) 01/03/2024   COVID-19 Vaccine (6 - 2025-26 season) 07/11/2024   Medicare Annual Wellness (AWV)  05/03/2025   Colonoscopy  06/17/2027   Pneumococcal Vaccine: 50+ Years  Completed   Influenza Vaccine  Completed   Hepatitis C Screening  Completed   Zoster Vaccines- Shingrix  Completed   Meningococcal B Vaccine  Aged Out   COLON CANCER SCREENING ANNUAL FOBT  Discontinued        Assessment/Plan:  This is a routine wellness examination for [facility].  Patient Care Team: Dettinger, Fonda LABOR, MD as PCP - General (Family Medicine)  I have personally reviewed and noted the following in the patients chart:   Medical and social history Use of alcohol, tobacco or illicit drugs  Current medications and supplements including opioid prescriptions. Functional ability and status Nutritional status Physical activity Advanced directives List of other physicians Hospitalizations, surgeries, and ER visits in previous 12 months Vitals Screenings to include cognitive, depression, and falls Referrals and appointments  No orders of the defined types were placed  in this encounter.  In addition, I have reviewed and discussed with patient certain preventive protocols, quality metrics, and best practice recommendations. A written personalized care plan for preventive services as well as general preventive health recommendations were provided to patient.   Ozie Ned, CMA   05/15/2024   Return in 1 year (on 05/03/2025).  After Visit Summary:(MyChart) Due to this an in-person visit, the after visit summary with patients personalized plan was offered to patient via MyChart.  Nurse Notes: Dtap given at next pcp visit  "

## 2024-05-16 ENCOUNTER — Encounter: Payer: Self-pay | Admitting: Family Medicine

## 2024-05-16 ENCOUNTER — Telehealth: Payer: Self-pay | Admitting: Family Medicine

## 2024-05-16 ENCOUNTER — Ambulatory Visit: Admitting: Family Medicine

## 2024-05-16 ENCOUNTER — Other Ambulatory Visit: Payer: Self-pay | Admitting: Family Medicine

## 2024-05-16 VITALS — BP 129/72 | HR 66 | Temp 97.8°F | Ht 70.0 in | Wt 213.4 lb

## 2024-05-16 DIAGNOSIS — N1832 Chronic kidney disease, stage 3b: Secondary | ICD-10-CM

## 2024-05-16 DIAGNOSIS — E349 Endocrine disorder, unspecified: Secondary | ICD-10-CM

## 2024-05-16 DIAGNOSIS — I10 Essential (primary) hypertension: Secondary | ICD-10-CM | POA: Diagnosis not present

## 2024-05-16 DIAGNOSIS — M2550 Pain in unspecified joint: Secondary | ICD-10-CM

## 2024-05-16 DIAGNOSIS — R5383 Other fatigue: Secondary | ICD-10-CM | POA: Diagnosis not present

## 2024-05-16 DIAGNOSIS — R6889 Other general symptoms and signs: Secondary | ICD-10-CM | POA: Diagnosis not present

## 2024-05-16 DIAGNOSIS — E785 Hyperlipidemia, unspecified: Secondary | ICD-10-CM

## 2024-05-16 DIAGNOSIS — K219 Gastro-esophageal reflux disease without esophagitis: Secondary | ICD-10-CM

## 2024-05-16 MED ORDER — OMEPRAZOLE 40 MG PO CPDR: 40.0000 mg | DELAYED_RELEASE_CAPSULE | Freq: Every morning | ORAL | 0 refills | Status: AC

## 2024-05-16 NOTE — Telephone Encounter (Signed)
 Patient has lab appt 2-6 for his upcoming appt with Dettinger. Please add orders.

## 2024-05-16 NOTE — Telephone Encounter (Signed)
 Labs placed.

## 2024-05-16 NOTE — Progress Notes (Signed)
 "  Acute Office Visit  Subjective:     Patient ID: Matthew [name], male    DOB: Jan 25, 1956, 69 y.o.   MRN: [id number]  Chief Complaint  Patient presents with   Fatigue    HPI  History of Present Illness   [name] is a 69 year old male with chronic kidney disease who presents with fatigue and cold intolerance.  He has been experiencing significant fatigue and cold intolerance since November or December. Initially, he attributed these symptoms to overworking, despite being retired and working part-time. He took December off to rest, but the symptoms persisted. He also had COVID-19 in December, which he thought might be contributing to his ongoing symptoms. He describes feeling unusually cold, particularly in his arms and shoulders, even when indoors. Despite using blankets, he continues to feel cold and tired.  His diet has been normal, but he has experienced weight fluctuations. He has a history of weight fluctuations, having weighed as much as 290 pounds in the past, and currently weighs 213 pounds.  He reports poor sleep quality, waking frequently at night and sometimes staying up for extended periods. He has a history of sleep apnea surgery and occasionally uses melatonin. He discontinued amitriptyline over six months ago due to insurance concerns. He estimates getting about six hours of sleep per night on average.  He experiences joint pain, particularly in his elbows and knees, which eases quickly once he is mobile. He has a known torn meniscus. No joint swelling or tenderness to touch.  He reports occasional shortness of breath, particularly at night, but can walk two miles or hike without issue. No heart palpitations, dizziness, or lightheadedness.  He has experienced back pain, primarily in the center, described as achy rather than burning. He also has been experiencing a burning epigastric pain and feels like this pain radiates from his abdomen to his back. He has been  taking Pepcid  twice daily but not Prilosec recently as he ran out of refills. He also has been having some heartburn.       ROS As per HPI.      Objective:    BP 129/72   Pulse 66   Temp 97.8 F (36.6 C) (Temporal)   Ht 5' 10 (1.778 m)   Wt 213 lb 6.4 oz (96.8 kg)   SpO2 99%   BMI 30.62 kg/m  Wt Readings from Last 3 Encounters:  05/16/24 213 lb 6.4 oz (96.8 kg)  05/03/24 218 lb (98.9 kg)  09/18/23 197 lb (89.4 kg)      Physical Exam Vitals and nursing note reviewed.  Constitutional:      General: He is not in acute distress.    Appearance: He is obese. He is not ill-appearing, toxic-appearing or diaphoretic.  Neck:     Thyroid : No thyroid  mass, thyromegaly or thyroid  tenderness.  Cardiovascular:     Rate and Rhythm: Normal rate and regular rhythm.     Heart sounds: Normal heart sounds. No murmur heard. Pulmonary:     Effort: Pulmonary effort is normal. No respiratory distress.     Breath sounds: Normal breath sounds. No wheezing, rhonchi or rales.  Chest:     Chest wall: No tenderness.  Musculoskeletal:     Right lower leg: No edema.     Left lower leg: No edema.  Skin:    General: Skin is warm and dry.  Neurological:     General: No focal deficit present.     Mental Status:  He is alert and oriented to person, place, and time.  Psychiatric:        Mood and Affect: Mood normal.        Behavior: Behavior normal.     No results found for any visits on 05/16/24.      Assessment & Plan:   Biran was seen today for fatigue.  Diagnoses and all orders for this visit:  Other fatigue -     ANA,IFA RA Diag Pnl w/rflx Tit/Patn -     Anemia Profile B -     BMP8+EGFR -     TSH + free T4  Cold intolerance -     Anemia Profile B -     BMP8+EGFR -     TSH + free T4  Arthralgia of multiple joints -     ANA,IFA RA Diag Pnl w/rflx Tit/Patn -     Sedimentation Rate -     C-reactive protein  Gastroesophageal reflux disease, unspecified whether esophagitis  present -     omeprazole  (PRILOSEC) 40 MG capsule; Take 1 capsule (40 mg total) by mouth every morning.  Stage 3b chronic kidney disease (HCC)  Primary hypertension  Testosterone  deficiency   Assessment and Plan    Fatigue and cold intolerance Chronic fatigue and cold intolerance possibly exacerbated by recent COVID-19. Differential includes anemia, thyroid  dysfunction, and autoimmune disease. - Ordered anemia panel including hemoglobin, iron, B12, and folate levels. - Ordered thyroid  function tests. - Ordered ANA test for autoimmune screening. - Ordered CRP and sedimentation rate. - Ordered electrolytes and kidney function tests.  Arthralgia of multiple joints Intermittent joint pain in elbows and knees. Differential includes autoimmune disease. - Ordered ANA test for autoimmune screening. - Ordered CRP and sedimentation rate.  Gastroesophageal reflux disease Symptoms suggestive of acid reflux, previously managed with Pepcid  and Prilosec. - Prescribed omeprazole .  Chronic kidney disease - Ordered electrolytes and kidney function tests. Discussed that CKD can contribute to anemia.   HTN BP at goal.     Testosterone  deficiency Has not had refills since July of last year. Has upcoming appt with PCP who manages this. Discussed that this may contribute to fatigue and to keep appt with PCP for follow up.    Return to office for new or worsening symptoms, or if symptoms persist.   The patient indicates understanding of these issues and agrees with the plan.  Annabella CHRISTELLA Search, FNP   "

## 2024-05-17 ENCOUNTER — Ambulatory Visit: Payer: Self-pay | Admitting: Family Medicine

## 2024-05-17 ENCOUNTER — Other Ambulatory Visit

## 2024-05-17 DIAGNOSIS — N1832 Chronic kidney disease, stage 3b: Secondary | ICD-10-CM

## 2024-05-17 DIAGNOSIS — E785 Hyperlipidemia, unspecified: Secondary | ICD-10-CM

## 2024-05-17 DIAGNOSIS — E349 Endocrine disorder, unspecified: Secondary | ICD-10-CM

## 2024-05-17 LAB — ANEMIA PROFILE B
Basophils Absolute: 0 10*3/uL (ref 0.0–0.2)
Basos: 1 %
EOS (ABSOLUTE): 0.2 10*3/uL (ref 0.0–0.4)
Eos: 3 %
Ferritin: 14 ng/mL — AB (ref 30–400)
Folate: 19.6 ng/mL
Hematocrit: 43.3 % (ref 37.5–51.0)
Hemoglobin: 13.1 g/dL (ref 13.0–17.7)
Immature Grans (Abs): 0 10*3/uL (ref 0.0–0.1)
Immature Granulocytes: 0 %
Iron Saturation: 17 % (ref 15–55)
Iron: 74 ug/dL (ref 38–169)
Lymphocytes Absolute: 1.5 10*3/uL (ref 0.7–3.1)
Lymphs: 21 %
MCH: 24.1 pg — ABNORMAL LOW (ref 26.6–33.0)
MCHC: 30.3 g/dL — ABNORMAL LOW (ref 31.5–35.7)
MCV: 80 fL (ref 79–97)
Monocytes Absolute: 0.5 10*3/uL (ref 0.1–0.9)
Monocytes: 7 %
Neutrophils Absolute: 5.1 10*3/uL (ref 1.4–7.0)
Neutrophils: 68 %
Platelets: 243 10*3/uL (ref 150–450)
RBC: 5.44 x10E6/uL (ref 4.14–5.80)
RDW: 15 % (ref 11.6–15.4)
Retic Ct Pct: 1 % (ref 0.6–2.6)
Total Iron Binding Capacity: 434 ug/dL (ref 250–450)
UIBC: 360 ug/dL — ABNORMAL HIGH (ref 111–343)
Vitamin B-12: 642 pg/mL (ref 232–1245)
WBC: 7.5 10*3/uL (ref 3.4–10.8)

## 2024-05-17 LAB — TSH+FREE T4
Free T4: 1.53 ng/dL (ref 0.82–1.77)
TSH: 1.87 u[IU]/mL (ref 0.450–4.500)

## 2024-05-17 LAB — BMP8+EGFR
BUN/Creatinine Ratio: 14 (ref 10–24)
BUN: 24 mg/dL (ref 8–27)
CO2: 23 mmol/L (ref 20–29)
Calcium: 9.6 mg/dL (ref 8.6–10.2)
Chloride: 104 mmol/L (ref 96–106)
Creatinine, Ser: 1.67 mg/dL — ABNORMAL HIGH (ref 0.76–1.27)
Glucose: 96 mg/dL (ref 70–99)
Potassium: 4.8 mmol/L (ref 3.5–5.2)
Sodium: 142 mmol/L (ref 134–144)
eGFR: 45 mL/min/{1.73_m2} — ABNORMAL LOW

## 2024-05-17 LAB — SEDIMENTATION RATE: Sed Rate: 18 mm/h (ref 0–30)

## 2024-05-17 LAB — C-REACTIVE PROTEIN: CRP: 7 mg/L (ref 0–10)

## 2024-05-17 LAB — ANA,IFA RA DIAG PNL W/RFLX TIT/PATN: Rheumatoid fact SerPl-aCnc: <10 [IU]/mL

## 2024-05-29 ENCOUNTER — Ambulatory Visit: Admitting: Family Medicine

## 2024-06-24 ENCOUNTER — Ambulatory Visit: Admitting: Urology
